# Patient Record
Sex: Female | Born: 1935 | Race: White | Hispanic: No | Smoking: Never smoker
Health system: Southern US, Community
[De-identification: ages and names within clinical notes are randomized; demographics above are authoritative.]

## PROBLEM LIST (undated history)

## (undated) DIAGNOSIS — E039 Hypothyroidism, unspecified: Secondary | ICD-10-CM

## (undated) DIAGNOSIS — N882 Stricture and stenosis of cervix uteri: Secondary | ICD-10-CM

## (undated) DIAGNOSIS — J8410 Pulmonary fibrosis, unspecified: Secondary | ICD-10-CM

## (undated) DIAGNOSIS — K222 Esophageal obstruction: Secondary | ICD-10-CM

## (undated) DIAGNOSIS — K219 Gastro-esophageal reflux disease without esophagitis: Secondary | ICD-10-CM

## (undated) DIAGNOSIS — M8580 Other specified disorders of bone density and structure, unspecified site: Secondary | ICD-10-CM

## (undated) DIAGNOSIS — I10 Essential (primary) hypertension: Secondary | ICD-10-CM

## (undated) DIAGNOSIS — M1990 Unspecified osteoarthritis, unspecified site: Secondary | ICD-10-CM

## (undated) DIAGNOSIS — E785 Hyperlipidemia, unspecified: Secondary | ICD-10-CM

## (undated) HISTORY — DX: Esophageal obstruction: K22.2

## (undated) HISTORY — PX: TOTAL HIP ARTHROPLASTY: SHX124

## (undated) HISTORY — DX: Gastro-esophageal reflux disease without esophagitis: K21.9

## (undated) HISTORY — DX: Hyperlipidemia, unspecified: E78.5

## (undated) HISTORY — PX: OTHER SURGICAL HISTORY: SHX169

## (undated) HISTORY — DX: Stricture and stenosis of cervix uteri: N88.2

## (undated) HISTORY — DX: Essential (primary) hypertension: I10

## (undated) HISTORY — DX: Other specified disorders of bone density and structure, unspecified site: M85.80

## (undated) HISTORY — DX: Pulmonary fibrosis, unspecified: J84.10

## (undated) HISTORY — PX: REVISION TOTAL KNEE ARTHROPLASTY: SHX767

## (undated) HISTORY — PX: NASAL SINUS SURGERY: SHX719

## (undated) HISTORY — DX: Hypothyroidism, unspecified: E03.9

---

## 1976-05-01 HISTORY — PX: VESICOVAGINAL FISTULA CLOSURE W/ TAH: SUR271

## 1976-05-01 HISTORY — PX: ABDOMINAL HYSTERECTOMY: SHX81

## 1997-11-18 ENCOUNTER — Other Ambulatory Visit: Admission: RE | Admit: 1997-11-18 | Discharge: 1997-11-18 | Payer: Self-pay | Admitting: Endocrinology

## 2002-12-25 ENCOUNTER — Encounter: Payer: Self-pay | Admitting: Internal Medicine

## 2002-12-25 ENCOUNTER — Ambulatory Visit (HOSPITAL_COMMUNITY): Admission: RE | Admit: 2002-12-25 | Discharge: 2002-12-25 | Payer: Self-pay | Admitting: Internal Medicine

## 2003-12-15 ENCOUNTER — Encounter: Admission: RE | Admit: 2003-12-15 | Discharge: 2003-12-15 | Payer: Self-pay | Admitting: Endocrinology

## 2004-05-18 ENCOUNTER — Ambulatory Visit (HOSPITAL_COMMUNITY): Admission: RE | Admit: 2004-05-18 | Discharge: 2004-05-18 | Payer: Self-pay | Admitting: Gastroenterology

## 2004-06-27 ENCOUNTER — Encounter: Admission: RE | Admit: 2004-06-27 | Discharge: 2004-06-27 | Payer: Self-pay | Admitting: Endocrinology

## 2004-07-12 ENCOUNTER — Ambulatory Visit (HOSPITAL_BASED_OUTPATIENT_CLINIC_OR_DEPARTMENT_OTHER): Admission: RE | Admit: 2004-07-12 | Discharge: 2004-07-12 | Payer: Self-pay | Admitting: Orthopedic Surgery

## 2004-07-12 ENCOUNTER — Ambulatory Visit (HOSPITAL_COMMUNITY): Admission: RE | Admit: 2004-07-12 | Discharge: 2004-07-12 | Payer: Self-pay | Admitting: Orthopedic Surgery

## 2005-06-13 ENCOUNTER — Encounter: Admission: RE | Admit: 2005-06-13 | Discharge: 2005-06-13 | Payer: Self-pay | Admitting: Endocrinology

## 2005-09-22 ENCOUNTER — Ambulatory Visit (HOSPITAL_COMMUNITY): Admission: RE | Admit: 2005-09-22 | Discharge: 2005-09-22 | Payer: Self-pay | Admitting: Orthopedic Surgery

## 2006-07-13 ENCOUNTER — Encounter: Admission: RE | Admit: 2006-07-13 | Discharge: 2006-07-13 | Payer: Self-pay | Admitting: Cardiovascular Disease

## 2007-04-12 ENCOUNTER — Inpatient Hospital Stay (HOSPITAL_COMMUNITY): Admission: RE | Admit: 2007-04-12 | Discharge: 2007-04-15 | Payer: Self-pay | Admitting: Orthopedic Surgery

## 2007-09-16 ENCOUNTER — Encounter: Payer: Self-pay | Admitting: Orthopedic Surgery

## 2007-09-30 ENCOUNTER — Encounter: Payer: Self-pay | Admitting: Orthopedic Surgery

## 2007-10-30 ENCOUNTER — Encounter: Payer: Self-pay | Admitting: Orthopedic Surgery

## 2008-01-10 ENCOUNTER — Ambulatory Visit (HOSPITAL_COMMUNITY): Admission: EM | Admit: 2008-01-10 | Discharge: 2008-01-10 | Payer: Self-pay | Admitting: Emergency Medicine

## 2008-07-07 ENCOUNTER — Encounter: Admission: RE | Admit: 2008-07-07 | Discharge: 2008-07-07 | Payer: Self-pay | Admitting: Orthopedic Surgery

## 2008-09-08 ENCOUNTER — Emergency Department (HOSPITAL_COMMUNITY): Admission: EM | Admit: 2008-09-08 | Discharge: 2008-09-09 | Payer: Self-pay | Admitting: Emergency Medicine

## 2008-10-08 ENCOUNTER — Encounter: Admission: RE | Admit: 2008-10-08 | Discharge: 2008-10-08 | Payer: Self-pay | Admitting: Endocrinology

## 2008-11-18 ENCOUNTER — Encounter: Payer: Self-pay | Admitting: Orthopedic Surgery

## 2008-11-26 IMAGING — CR DG PORTABLE PELVIS
1 series · 1 of 1 positions shown · non-contrast
Comparison: none

CLINICAL DATA: Right hip replacement

PELVIS - 1  VIEW:

[view not recorded]
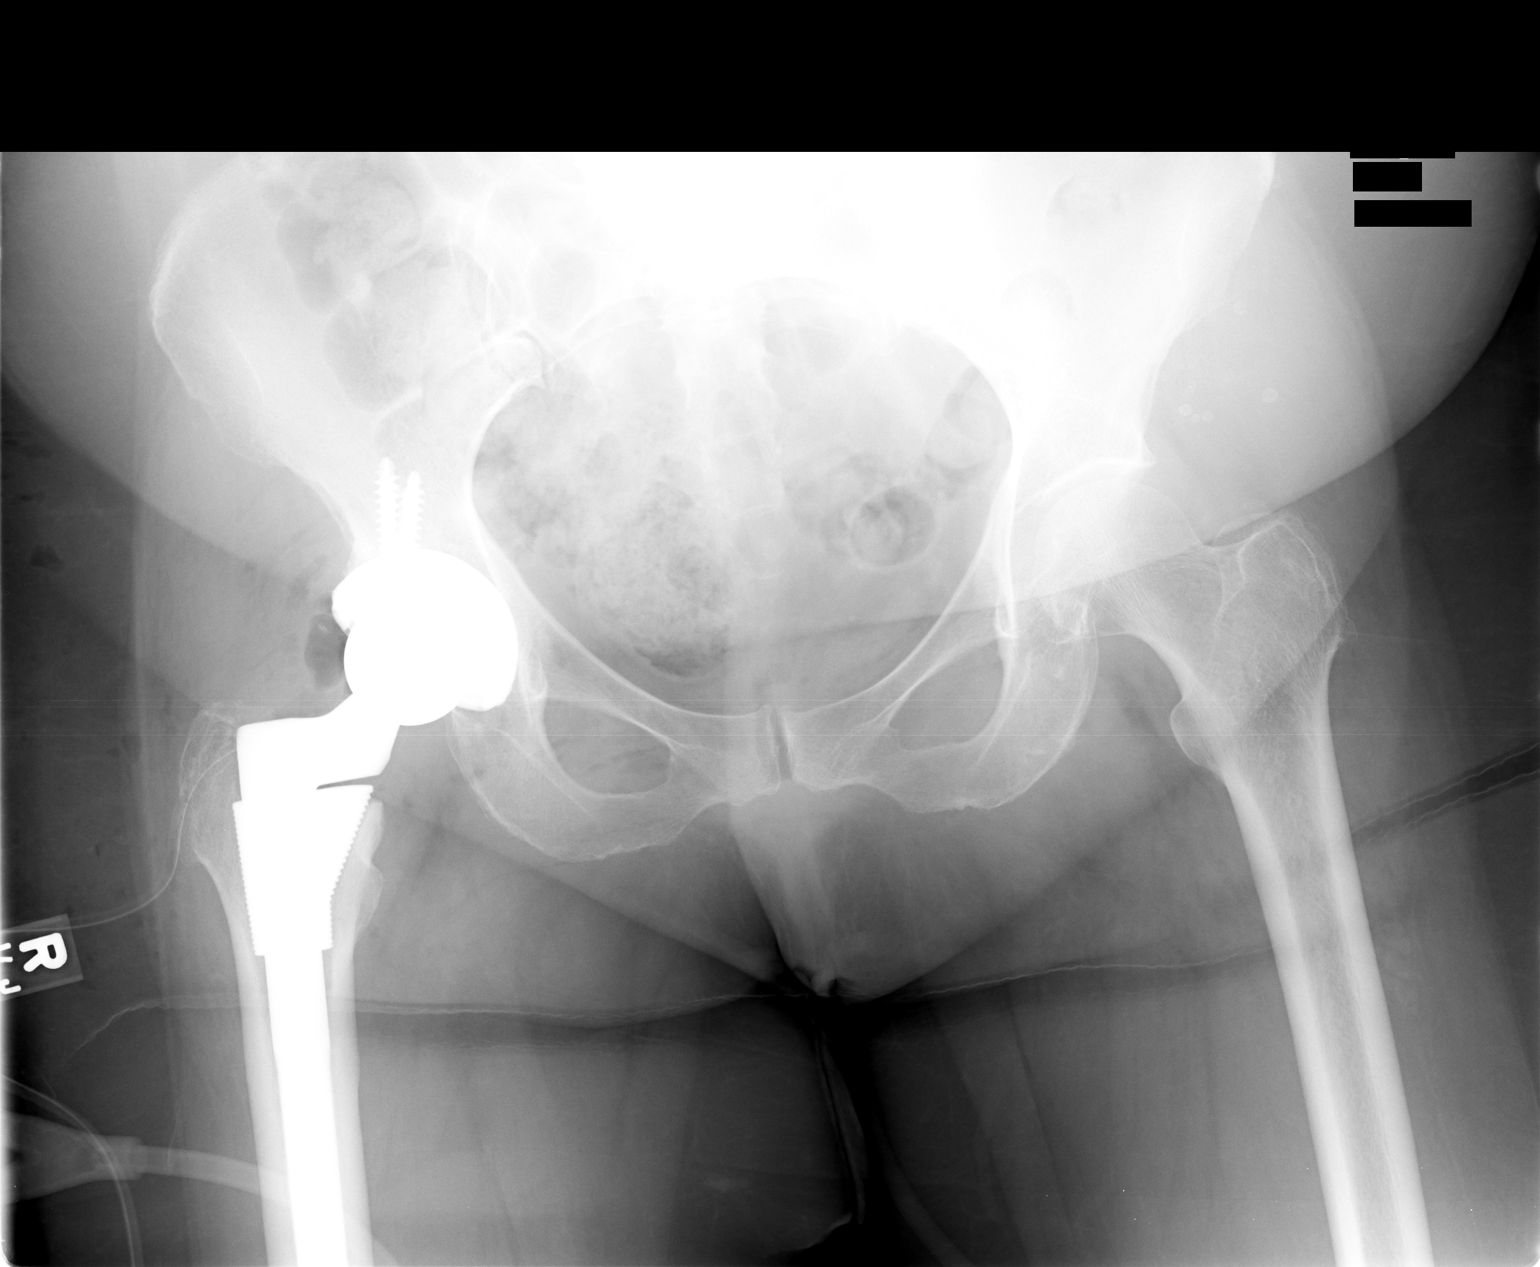

[1 of 1 positions shown; findings below may reference images not displayed]

FINDINGS: Patient status post right hip replacement. No normal AP alignment. No
complicating features.
IMPRESSION: Right hip replacement without complicating feature.

## 2008-11-29 ENCOUNTER — Encounter: Payer: Self-pay | Admitting: Orthopedic Surgery

## 2009-04-07 ENCOUNTER — Encounter: Admission: RE | Admit: 2009-04-07 | Discharge: 2009-04-07 | Payer: Self-pay | Admitting: Endocrinology

## 2009-04-14 ENCOUNTER — Ambulatory Visit: Payer: Self-pay | Admitting: Internal Medicine

## 2009-04-14 DIAGNOSIS — I10 Essential (primary) hypertension: Secondary | ICD-10-CM | POA: Insufficient documentation

## 2009-04-14 DIAGNOSIS — E785 Hyperlipidemia, unspecified: Secondary | ICD-10-CM | POA: Insufficient documentation

## 2009-04-14 DIAGNOSIS — J8410 Pulmonary fibrosis, unspecified: Secondary | ICD-10-CM

## 2009-04-15 ENCOUNTER — Telehealth: Payer: Self-pay | Admitting: Internal Medicine

## 2009-04-15 LAB — CONVERTED CEMR LAB
Basophils Absolute: 0 10*3/uL (ref 0.0–0.1)
Basophils Relative: 0.7 % (ref 0.0–3.0)
Eosinophils Absolute: 0.2 10*3/uL (ref 0.0–0.7)
HCT: 39.7 % (ref 36.0–46.0)
Hemoglobin: 13.4 g/dL (ref 12.0–15.0)
Lymphocytes Relative: 29.7 % (ref 12.0–46.0)
Lymphs Abs: 1.9 10*3/uL (ref 0.7–4.0)
MCHC: 33.8 g/dL (ref 30.0–36.0)
Neutro Abs: 3.9 10*3/uL (ref 1.4–7.7)
RBC: 4.37 M/uL (ref 3.87–5.11)
RDW: 14.1 % (ref 11.5–14.6)

## 2009-04-22 ENCOUNTER — Telehealth: Payer: Self-pay | Admitting: Internal Medicine

## 2009-05-03 ENCOUNTER — Telehealth: Payer: Self-pay | Admitting: Internal Medicine

## 2009-05-05 ENCOUNTER — Ambulatory Visit: Payer: Self-pay | Admitting: Internal Medicine

## 2009-05-05 DIAGNOSIS — N3000 Acute cystitis without hematuria: Secondary | ICD-10-CM | POA: Insufficient documentation

## 2009-05-05 LAB — CONVERTED CEMR LAB
BUN: 14 mg/dL (ref 6–23)
Basophils Absolute: 0 10*3/uL (ref 0.0–0.1)
Bilirubin Urine: NEGATIVE
CO2: 30 meq/L (ref 19–32)
Eosinophils Absolute: 0.2 10*3/uL (ref 0.0–0.7)
GFR calc non Af Amer: 73.79 mL/min (ref >60)
Glucose, Bld: 96 mg/dL (ref 70–99)
HCT: 41.8 % (ref 36.0–46.0)
Hemoglobin: 13.6 g/dL (ref 12.0–15.0)
Ketones, ur: NEGATIVE mg/dL
Lymphs Abs: 1.6 10*3/uL (ref 0.7–4.0)
MCHC: 32.4 g/dL (ref 30.0–36.0)
Monocytes Absolute: 0.6 10*3/uL (ref 0.1–1.0)
Neutro Abs: 5.2 10*3/uL (ref 1.4–7.7)
Platelets: 273 10*3/uL (ref 150.0–400.0)
Potassium: 4.5 meq/L (ref 3.5–5.1)
RDW: 13.3 % (ref 11.5–14.6)
Sed Rate: 67 mm/hr — ABNORMAL HIGH (ref 0–22)
Sodium: 139 meq/L (ref 135–145)
Urine Glucose: NEGATIVE mg/dL
Urobilinogen, UA: 0.2 (ref 0.0–1.0)

## 2009-05-07 ENCOUNTER — Telehealth (INDEPENDENT_AMBULATORY_CARE_PROVIDER_SITE_OTHER): Payer: Self-pay | Admitting: *Deleted

## 2009-05-14 ENCOUNTER — Ambulatory Visit: Payer: Self-pay | Admitting: Internal Medicine

## 2009-05-14 DIAGNOSIS — K219 Gastro-esophageal reflux disease without esophagitis: Secondary | ICD-10-CM

## 2009-05-31 ENCOUNTER — Ambulatory Visit: Payer: Self-pay | Admitting: Internal Medicine

## 2009-05-31 DIAGNOSIS — J310 Chronic rhinitis: Secondary | ICD-10-CM

## 2009-06-25 ENCOUNTER — Ambulatory Visit: Payer: Self-pay | Admitting: Internal Medicine

## 2009-07-14 ENCOUNTER — Telehealth (INDEPENDENT_AMBULATORY_CARE_PROVIDER_SITE_OTHER): Payer: Self-pay | Admitting: *Deleted

## 2009-07-28 ENCOUNTER — Ambulatory Visit: Payer: Self-pay | Admitting: Internal Medicine

## 2009-09-09 ENCOUNTER — Ambulatory Visit: Payer: Self-pay | Admitting: Internal Medicine

## 2009-09-10 LAB — CONVERTED CEMR LAB
Basophils Absolute: 0 10*3/uL (ref 0.0–0.1)
Hemoglobin: 13.8 g/dL (ref 12.0–15.0)
Lymphocytes Relative: 15 % (ref 12.0–46.0)
Monocytes Relative: 5.8 % (ref 3.0–12.0)
Neutro Abs: 7.1 10*3/uL (ref 1.4–7.7)
Neutrophils Relative %: 78.3 % — ABNORMAL HIGH (ref 43.0–77.0)
Platelets: 263 10*3/uL (ref 150.0–400.0)
RDW: 14.9 % — ABNORMAL HIGH (ref 11.5–14.6)
Sed Rate: 32 mm/hr — ABNORMAL HIGH (ref 0–22)

## 2009-09-14 LAB — CONVERTED CEMR LAB: IgE (Immunoglobulin E), Serum: 2.5 intl units/mL (ref 0.0–180.0)

## 2009-10-25 ENCOUNTER — Ambulatory Visit: Payer: Self-pay | Admitting: Internal Medicine

## 2009-12-02 ENCOUNTER — Encounter: Payer: Self-pay | Admitting: Internal Medicine

## 2010-01-31 ENCOUNTER — Telehealth (INDEPENDENT_AMBULATORY_CARE_PROVIDER_SITE_OTHER): Payer: Self-pay | Admitting: *Deleted

## 2010-04-21 ENCOUNTER — Encounter: Payer: Self-pay | Admitting: Internal Medicine

## 2010-05-22 ENCOUNTER — Encounter: Payer: Self-pay | Admitting: Orthopedic Surgery

## 2010-05-31 NOTE — Assessment & Plan Note (Signed)
Summary: Pulmonary/ ext f/u ov rx uti with amox   Copy to:  Dr. Adrian Prince Primary Provider/Referring Provider:  Dr. Adrian Prince  CC:  3 wk followup.  Pt states that her chest does not feel as tight.  She states that her cough has been "horrible"- prod with clear sputum.  She states that her breathing is some better.  She c/o having "no energy at all"..  History of Present Illness: 55 yowf never smoker with h/o rhinitis since 1957 seasonal shots helped  afrin dep and has carried dx of asthma for approx one year rx with singulair which may not have helped the breathing but may have helped her nasal symptoms.  April 14, 2009 cc chest discomfort developed overnight early October(occured one week exposure  to sick people traveling from [place] on a bus/air trip)   worse with breathing assoc with dry cough increase sob got worse over a week and went to Dr Evlyn Kanner dx as pneumonia rx with 2 rounds of abx seemed some better but still has discomfort with deep breath and worse sob with activity.  sob or comfort  worse with deep breath, and lyingdown occurs but it will extinguish as long as lies still, then worse after wakes up to go to BR or with talking.  Changed from nitrofurantoin to trimetoprim thinks about 1 week ago she thinks (last bottle of 30 filled on 01/16/09 per pharmacy records).  Rec was to try taking ppi two times a day and pepcid at bedtime plus delsym and tramadol could not tolerate delsym dizzy.  May 05, 2009 cough no better then develop nasal congestion with yellow mucus rx mucinex and feels better.  Pt denies any significant sore throat, dysphagia, itching, sneezing,  nasal congestion or excess secretions,  fever, chills, sweats, unintended wt loss, pleuritic or exertional cp, hempoptysis, change in activity tolerance  orthopnea pnd or leg swelling Pt also denies any obvious fluctuation in symptoms with weather or environmental change or other alleviating or aggravating factors.        Current Medications (verified): 1)  Singulair 10 Mg Tabs (Montelukast Sodium) .Marland Kitchen.. 1 Once Daily 2)  Flonase 50 Mcg/act Susp (Fluticasone Propionate) .... 2 Sprays Each Nostril Once Daily 3)  Synthroid 100 Mcg Tabs (Levothyroxine Sodium) .Marland Kitchen.. 1 Once Daily 4)  Mobic 15 Mg Tabs (Meloxicam) .Marland Kitchen.. 1 Once Daily 5)  Tylenol 325 Mg Tabs (Acetaminophen) .... As Needed 6)  Aspirin 81 Mg Tbec (Aspirin) .Marland Kitchen.. 1 Once Daily 7)  Benicar Hct 40-25 Mg Tabs (Olmesartan Medoxomil-Hctz) .... 1/2 Once Daily 8)  Crestor 10 Mg Tabs (Rosuvastatin Calcium) .Marland Kitchen.. 1 Once Daily 9)  Multivitamins  Tabs (Multiple Vitamin) .Marland Kitchen.. 1 Once Daily 10)  Prilosec Otc 20 Mg Tbec (Omeprazole Magnesium) .... Take One 30-60 Min Before First and Last Meals of The Day 11)  Pepcid Ac Maximum Strength 20 Mg Tabs (Famotidine) .... One At Bedtime 12)  Tramadol Hcl 50 Mg  Tabs (Tramadol Hcl) .... One To Two By Mouth Every 4-6 Hours For Cough or Cp 13)  Promethazine-Codeine 6.25-10 Mg/37ml Syrp (Promethazine-Codeine) .Marland Kitchen.. 1 Teaspoon Every 6 Hours As Needed 14)  Mucinex Dm Maximum Strength 60-1200 Mg Xr12h-Tab (Dextromethorphan-Guaifenesin) .Marland Kitchen.. 1 To 2 Two Times A Day As Needed  Allergies (verified): No Known Drug Allergies  Past History:  Past Medical History: Hyperlipidemia Hypertension GERD    - s/p stretching 2010 Dr [address]ore[address] Pulmonary Fibrosis    - Hx of macrodantin exposure stopped ? 01/2009    - ESR  49   04/14/2009   > 67 May 05, 2009   Family History: Reviewed history from 04/14/2009 and no changes required. Heart dz- Father Negative for respiratory diseases or atopy   Social History: Reviewed history from 04/14/2009 and no changes required. Married Children Never smoker No ETOH Retired   Vital Signs:  Patient profile:   77 year old female Weight:      166 pounds O2 Sat:      95 % on Room air Temp:     97.7 degrees F oral Pulse rate:   66 / minute BP sitting:   130 / 82  (left arm)  Vitals Entered By: Vernie Murders (May 05, 2009 10:12 AM)  O2 Flow:  Room air  Physical Exam  Additional Exam:  amb argumentatinve wf nad  wt 171 April 14, 2009 > 166 May 05, 2009  HEENT: nl dentition, turbinates, and orophanx. Nl external ear canals without cough reflex Neck without JVD/Nodes/TM Lungs bilateral insp crackles R > L with cough on insp RRR no s3 or murmur or increase in P2 Abd soft and benign with nl excursion in the supine position. No bruits or organomegaly Ext warm without calf tenderness, cyanosis clubbing or edema Skin warm and dry without lesions   Sodium                    139 mEq/L                   135-145   Potassium                 4.5 mEq/L                   3.5-5.1   Chloride                  102 mEq/L                   96-112   Carbon Dioxide            30 mEq/L                    19-32   Glucose                   96 mg/dL                    04-54   BUN                       14 mg/dL                    0-98   Creatinine                0.8 mg/dL                   1.1-9.1   Calcium                   9.6 mg/dL                   4.7-82.9   GFR                       73.79 mL/min                >60  Tests: (2) CBC Platelet  w/Diff (CBCD)   White Cell Count          7.6 K/uL                    4.5-10.5   Red Cell Count            4.59 Mil/uL                 3.87-5.11   Hemoglobin                13.6 g/dL                   25.3-66.4   Hematocrit                41.8 %                      36.0-46.0   MCV                       91.2 fl                     78.0-100.0   MCHC                      32.4 g/dL                   40.3-47.4   RDW                       13.3 %                      11.5-14.6   Platelet Count            273.0 K/uL                  150.0-400.0   Neutrophil %              67.5 %                      43.0-77.0   Lymphocyte %              21.3 %                      12.0-46.0   Monocyte %                8.4 %                       3.0-12.0   Eosinophils%               2.3 %                       0.0-5.0   Basophils %               0.5 %                       0.0-3.0   Neutrophill Absolute      5.2 K/uL                    1.4-7.7   Lymphocyte Absolute       1.6 K/uL  0.7-4.0   Monocyte Absolute         0.6 K/uL                    0.1-1.0  Eosinophils, Absolute                             0.2 K/uL                    0.0-0.7   Basophils Absolute        0.0 K/uL                    0.0-0.1  Tests: (3) Sed Rate (ESR)   Sed Rate             [H]  67 mm/hr                    0-22  Tests: (4) UDip w/Micro (URINE)   Color                     YELLOW       RANGE:  Yellow;Lt. Yellow   Clarity                   CLEAR                       Clear   Specific Gravity          1.020                       1.000 - 1.030   Urine Ph                  5.5                         5.0-8.0   Protein                   30                          Negative   Urine Glucose             NEGATIVE                    Negative   Ketones                   NEGATIVE                    Negative   Urine Bilirubin           NEGATIVE                    Negative   Blood                     SMALL                       Negative   Urobilinogen              0.2                         0.0 - 1.0   Leukocyte Esterace  LARGE                       Negative   Nitrite                   POSITIVE                    Negative   Urine WBC                 TNTC(>50/hpf)               0-2/hpf   Urine Epith               Rare(0-4/hpf)               Rare(0-4/hpf)   Urine Bacteria            Many(>50/hpf)               None  CXR  Procedure date:  05/05/2009  Findings:      Comparison: 04/14/2009   Findings: Findings are compatible with pulmonary interstitial fibrosis.  Focal parenchymal density lateral aspect of right midlung zone is stable. Normal cardiomediastinal silhouette.  Bony thorax intact.   IMPRESSION: Pulmonary interstitial fibrosis.  Stable focal parenchymal  density lateral aspect of the right midlung zone.  No acute infiltrate.    Impression & Recommendations:  Problem # 1:  PULMONARY FIBROSIS ILD POST INFLAMMATORY CHRONIC (ICD-515)  DDx for pulmonary fibrosis  in never smokers  includes idiopathic pulmonary fibrosis, pulmonary fibrosis associated with rheumatologic disease, adverse effect from  drugs such as chemotherapy or amiodarone exposure/ macrodantin use , nonspecific interstitial pneumonia which is typically steroid responsive, and chronic hypersensitivity pneumonitis.  In this case it's pretty clear the macrodantin was playing a role with mod elevation of esr and neg clubbing supportive but certainly not dx  For now follow conservatively off macrodantin, off all oil based vitamins since she has documented GERD and is only protected from acid, not non-acid gerd, by PPI rx reviewed.  See instructions for specific recommendations   extended discussion with pt re: patience with changes in rx   Offered second opinion at [facility], she declined.   No role for bx at this point.   Since ESR trending up off macrodantin, try very short course of low dose prednisone just for purposes of beginning to develop a dose response curve here acknowledging that the goal with a chronic steroid responsive illness is always arriving at the lowest effective dose that controls the disease/symptoms and not accepting a set "formula" which is based on statistics that don't take into accound individual variability or the natural hx of the dz in every individual patient, which may well vary over time.   Problem # 2:  CYSTITIS, ACUTE (ICD-595.0) Assessment: New  Pos pyuria, with E coli > 100k  sensitive to pcn.  rx amox 250 two times a day x 7 days Orders: T-Urine Culture (Spectrum Order) [id number]) TLB-Udip w/ Micro (81001-URINE) Est. Patient Level IV (13086)  Her updated medication list for this problem includes:    Amoxicillin 250 Mg Caps (Amoxicillin) .....  One twice daily x 7 days  Medications Added to Medication List This Visit: 1)  Mucinex Dm Maximum Strength 60-1200 Mg Xr12h-tab (Dextromethorphan-guaifenesin) .Marland Kitchen.. 1 to 2 two times a day as needed 2)  Prednisone 10 Mg Tabs (Prednisone) .... 4 each am x 2days, 2x2days, 1x2days and stop 3)  Amoxicillin 250 Mg  Caps (Amoxicillin) .... One twice daily x 7 days  Other Orders: T-2 View CXR (71020TC) TLB-BMP (Basic Metabolic Panel-BMET) (80048-METABOL) TLB-CBC Platelet - w/Differential (85025-CBCD) TLB-Sedimentation Rate (ESR) (85652-ESR)  Patient Instructions: 1)  Your last prescription for macrodantin was filled by you on 01/16/09 2)  you should avoid this drug because it can cause pulmonary fibrosis and fish oil can get in the lungs and cause a similar problem as can acid from the stomach 3)  we will call you with the lab results 4)  Please schedule a follow-up appointment in 2 weeks. 5)  ok to use mucinex and tramadol for cough 6)  Prednisone 4 each am x 2days, 2x2days, 1x2days and stop  Prescriptions: AMOXICILLIN 250 MG CAPS (AMOXICILLIN) one twice daily x 7 days  #14 x 0   Entered and Authorized by:   Nyoka Cowden MD   Signed by:   Nyoka Cowden MD on 05/07/2009   Method used:   Electronically to        [facility]  [address] [phone]* (retail)       [address]       [place], [place]  [address]       Ph: [phone]       Fax: [phone]   RxID:   [id number] PREDNISONE 10 MG  TABS (PREDNISONE) 4 each am x 2days, 2x2days, 1x2days and stop  #14 x 0   Entered and Authorized by:   Nyoka Cowden MD   Signed by:   Nyoka Cowden MD on 05/05/2009   Method used:   Electronically to        [facility]  [address] [phone]* (retail)       [address]       [place], [place]  [address]       Ph: [phone]       Fax: [phone]   RxID:   [id number]   Appended Document: Pulmonary/ ext f/u ov rx uti with amox let her know I called in  amox for her uti based on cultures (not allergic to pcn per records) copy to Dr Evlyn Kanner  Appended Document: Pulmonary/ ext f/u ov rx uti with amox Copy sent to [facility].  LMOMTCB.  Appended Document: Pulmonary/ ext f/u ov rx uti with amox Pt aware ABX is at the pharmacy for her.  Verified that she is not allergic to any meds.

## 2010-05-31 NOTE — Assessment & Plan Note (Signed)
Summary: Pulmonary/ final f/u ov  walked 3 laps fast with no desat   Copy to:  Dr. Adrian Prince Primary Provider/Referring Provider:  Dr. Adrian Prince  CC:  6 wk followup.  Pt states that her breathing is the same- "maybe some better".  She states that she still gets out of breath walking up and down steps and sometimes just walking from room to room.  Marland Kitchen  History of Present Illness: 73  yowf never smoker with h/o rhinitis since 1957 seasonal shots helped  afrin dep and has carried dx of asthma for   rx with singulair which may not have helped the breathing but may have helped her nasal symptoms.  April 14, 2009 cc chest discomfort developed overnight early October(occured one week exposure  to sick people traveling from [place] on a bus/air trip)   worse with breathing assoc with dry cough increase sob got worse over a week and went to Dr Evlyn Kanner dx as pneumonia rx with 2 rounds of abx seemed some better but still has discomfort with deep breath and worse sob with activity.   dx ? ILD ? from [facility]  with upper airway coughing rx as uacs with gerd rx but not adherent.  May 14, 2009 Followup.  Pt states that she woke up very SOB and gasping for air 2 nights ago.  She states that she has flet very "sluggish"- relates this to cough meds.  She still c/o cough - prod with clear sputum.  rec start prednisone 20 until better taper to 10.  May 31, 2009 some better on 20 of prednisone  tapered off (instructions said to hold at one). worse w/in a week off.  July 28, 2009 6 wk followup.  Pt states that her cough is about the same- now she is coughing up clear sputum- realtes this change to the rainy weather and allergies.  She states that her breathing had been okay but worse now since rainy weather. did worse off singulair in terms of "allergies and sinus" so restarted.  rec taper prednisone to 10 mg one half daily when better  October 25, 2009 tapered completely off prednisone June 2 no flare of  cough or sob sio far still off oil based supplments.  Pt denies any significant sore throat, dysphagia, itching, sneezing,  nasal congestion or excess secretions,  fever, chills, sweats, unintended wt loss, pleuritic or exertional cp, hempoptysis, change in activity tolerance  orthopnea pnd or leg swelling Pt also denies any obvious fluctuation in symptoms with weather or environmental change or other alleviating or aggravating factors.          Current Medications (verified): 1)  Flonase 50 Mcg/act Susp (Fluticasone Propionate) .... 2 Sprays Each Nostril Once Daily 2)  Synthroid 100 Mcg Tabs (Levothyroxine Sodium) .Marland Kitchen.. 1 Once Daily 3)  Mobic 15 Mg Tabs (Meloxicam) .Marland Kitchen.. 1 Once Daily 4)  Tylenol Extra Strength 500 Mg Tabs (Acetaminophen) .... As Directed As Needed 5)  Aspirin 81 Mg Tbec (Aspirin) .Marland Kitchen.. 1 Once Daily 6)  Crestor 10 Mg Tabs (Rosuvastatin Calcium) .Marland Kitchen.. 1 Once Daily 7)  Multivitamins  Tabs (Multiple Vitamin) .Marland Kitchen.. 1 Once Daily 8)  Prilosec Otc 20 Mg Tbec (Omeprazole Magnesium) .... Take  One 30-60 Min Before First Meal of The Day 9)  Tramadol Hcl 50 Mg  Tabs (Tramadol Hcl) .... One To Two By Mouth Every 4-6 Hours For Cough or Cp 10)  Promethazine-Codeine 6.25-10 Mg/68ml Syrp (Promethazine-Codeine) .Marland Kitchen.. 1 Teaspoon Every 6 Hours As Needed 11)  Trimethoprim 100 Mg Tabs (Trimethoprim) .... As Needed 12)  Benicar Hct 40-25 Mg  Tabs (Olmesartan Medoxomil-Hctz) .... One Half Daily 13)  Glucosomine Chondrotin Msg Maxium  1500-1200-500 .... 2 Daily 14)  Alprazolam 0.25 Mg Tabs (Alprazolam) .Marland Kitchen.. 1 At Bedtime As Needed  Allergies (verified): No Known Drug Allergies  Past History:  Past Medical History: Hyperlipidemia Hypertension GERD    - s/p stretching 2010 Dr Loreta Ave Pulmonary Fibrosis........................................................Marland KitchenWert    - Hx of macrodantin exposure stopped ? 01/2009    - ESR 49   04/14/2009   >  67 May 05, 2009 > 32 Sep 09, 2009     - Daily prednisone  rx May 15, 2009 > some better then worse after stopped 06/18/09    - Restart daily prednisone June 26, 2009 >> tapered off 09/30/09 > no relapse of symptoms .  Vital Signs:  Patient profile:   77 year old female Weight:      173 pounds O2 Sat:      94 % on Room air Temp:     97.6 degrees F oral Pulse rate:   76 / minute BP sitting:   116 / 74  (left arm)  Vitals Entered By: Vernie Murders (October 25, 2009 10:36 AM)  O2 Flow:  Room air CC: 6 wk followup.  Pt states that her breathing is the same- "maybe some better".  She states that she still gets out of breath walking up and down steps and sometimes just walking from room to room.   Comments Ambulatory Pulse Oximetry  Resting; HR_73____    02 Sat_95%RA____  Lap1 (185 feet)   HR_92____   02 Sat_94%RA____ Lap2 (185 feet)   HR__103___   02 Sat__93%RA___    Lap3 (185 feet)   HR__105___   02 Sat__93%RA___  __X_Test Completed without Difficulty ___Test Stopped due to:     Physical Exam  Additional Exam:  amb wf nad  .   wt 171 April 14, 2009 >   > 175 July 29, 2009 > 175 Sep 09, 2009 > 173  October 25, 2009 HEENT: nl dentition, turbinates, and orophanx. Nl external ear canals without cough reflex Neck without JVD/Nodes/TM Lungs minimal  bilateral insp crackles R > L with no cough now on insp RRR no s3 or murmur or increase in P2 Abd soft and benign with nl excursion in the supine position. No bruits or organomegaly Ext warm without calf tenderness, cyanosis clubbing or edema Skin warm and dry without lesions     Impression & Recommendations:  Problem # 1:  PULMONARY FIBROSIS ILD POST INFLAMMATORY CHRONIC (ICD-515) Appears to have had a steroid responsive but not dependent form of PF no longer desaturating ? macrodantin assoc or assoc with collagen vasc dz.   The goal with a chronic steroid dependent illness is always arriving at the lowest effective dose that controls the disease/symptoms and not accepting a set  "formula" which is based on statistics that don't take into accound individual variability or the natural hx of the dz in every individual patient, which may well vary over time. For now try to hold prednisone at 0.   Problem # 2:  GERD (ICD-530.81)  Her updated medication list for this problem includes:    Prilosec Otc 20 Mg Tbec (Omeprazole magnesium) .Marland Kitchen... Take  one 30-60 min before first meal of the day  Previously with pos egd so diet and ppi impt here since acid reflux risks flaring any form of ILD and  therefore strongly rec she avoid oil based supplements as well.  Medications Added to Medication List This Visit: 1)  Tylenol Extra Strength 500 Mg Tabs (Acetaminophen) .... As directed as needed 2)  Alprazolam 0.25 Mg Tabs (Alprazolam) .Marland Kitchen.. 1 at bedtime as needed  Other Orders: Est. Patient Level III (45409) Pulse Oximetry, Ambulatory (81191)  Patient Instructions: 1)  If consistently doing well with cough and beathing it is ok with me for you to resume fish oil if you think it helps your arthritis with the option of returning to Dr Loni Beckwith for follow up and let her treat your arthritis. 2)  Follow up here can be as needed

## 2010-05-31 NOTE — Assessment & Plan Note (Signed)
Summary: Pulmonary/ ext f/u ov   Copy to:  Dr. Adrian Prince Primary Provider/Referring Provider:  Dr. Adrian Prince  CC:  6 wk followup with cxr.  She c/o increased SOB x 2 days.  She states that she was doing well with prednisone and then when she d/c'ed med she started to notice increase in symptoms.  She c/o dry cough- occ produces thick clear sputum.  She also c/o chest "feels full"..  History of Present Illness: 30 yowf never smoker with h/o rhinitis since 1957 seasonal shots helped  afrin dep and has carried dx of asthma for approx one year rx with singulair which may not have helped the breathing but may have helped her nasal symptoms.  April 14, 2009 cc chest discomfort developed overnight early October(occured one week exposure  to sick people traveling from [place] on a bus/air trip)   worse with breathing assoc with dry cough increase sob got worse over a week and went to Dr Evlyn Kanner dx as pneumonia rx with 2 rounds of abx seemed some better but still has discomfort with deep breath and worse sob with activity.  sob or comfort  worse with deep breath, and lying down occurs but it will extinguish as long as lies still, then worse after wakes up to go to BR or with talking.  Changed from nitrofurantoin to trimetoprim thinks about 1 week prior to ov  (last bottle of 30 filled on 01/16/09 per pharmacy records).  Rec was to try taking ppi two times a day and pepcid at bedtime plus delsym and tramadol could not tolerate delsym dizzy   May 05, 2009 cough no better then develop nasal congestion with yellow mucus rx mucinex and feels better rx with amox / prednisone  > much better overall, at least transiently  May 14, 2009 Followup.  Pt states that she woke up very SOB and gasping for air 2 nights ago.  She states that she has flet very "sluggish"- relates this to cough meds.  She still c/o cough - prod with clear sputum.  rec start prednisone 20 until better taper to 10.  May 31, 2009  some better on 20 of prednisone  tapered off (instructions said to hold at one). worse w/in a week off.  June 26, 2009  cc much  increased SOB x 2 days.  She states that she was doing well with prednisone and then when she d/c'ed med she started to notice increase in symptoms.  She c/o dry cough- occ produces thick clear sputum.  She also c/o chest "feels full". Pt denies any significant sore throat, dysphagia, itching, sneezing,  nasal congestion or excess secretions.  Current Medications (verified): 1)  Singulair 10 Mg Tabs (Montelukast Sodium) .Marland Kitchen.. 1 Once Daily 2)  Flonase 50 Mcg/act Susp (Fluticasone Propionate) .... 2 Sprays Each Nostril Once Daily 3)  Synthroid 100 Mcg Tabs (Levothyroxine Sodium) .Marland Kitchen.. 1 Once Daily 4)  Mobic 15 Mg Tabs (Meloxicam) .Marland Kitchen.. 1 Once Daily 5)  Tylenol 325 Mg Tabs (Acetaminophen) .... As Needed 6)  Aspirin 81 Mg Tbec (Aspirin) .Marland Kitchen.. 1 Once Daily 7)  Benicar Hct 40-25 Mg Tabs (Olmesartan Medoxomil-Hctz) .... 1/2 Once Daily 8)  Crestor 10 Mg Tabs (Rosuvastatin Calcium) .Marland Kitchen.. 1 Once Daily 9)  Multivitamins  Tabs (Multiple Vitamin) .Marland Kitchen.. 1 Once Daily 10)  Prilosec Otc 20 Mg Tbec (Omeprazole Magnesium) .... Take One 30-60 Min Before First and Last Meals of The Day 11)  Pepcid Ac Maximum Strength 20 Mg Tabs (Famotidine) .Marland KitchenMarland KitchenMarland Kitchen  One At Bedtime 12)  Tramadol Hcl 50 Mg  Tabs (Tramadol Hcl) .... One To Two By Mouth Every 4-6 Hours For Cough or Cp 13)  Promethazine-Codeine 6.25-10 Mg/24ml Syrp (Promethazine-Codeine) .Marland Kitchen.. 1 Teaspoon Every 6 Hours As Needed 14)  Trimethoprim 100 Mg Tabs (Trimethoprim) .... As Needed  Allergies (verified): No Known Drug Allergies  Past History:  Past Medical History: Hyperlipidemia Hypertension GERD    - s/p stretching 2010 Dr Loreta Ave Pulmonary Fibrosis........................................................Marland KitchenWert    - Hx of macrodantin exposure stopped ? 01/2009    - ESR 49   04/14/2009   >  67 May 05, 2009     - Daily prednisone rx  May 15, 2009 > some better then worse after stopped 06/18/09    - Restart daily prednisone June 26, 2009  .  Vital Signs:  Patient profile:   77 year old female Weight:      170.38 pounds O2 Sat:      96 % on Room air Temp:     97.3 degrees F oral Pulse rate:   65 / minute BP sitting:   114 / 72  (left arm)  Vitals Entered By: Arman Filter LPN (June 25, 2009 10:45 AM)  O2 Flow:  Room air  Physical Exam  Additional Exam:  amb wf nad  who  failed to answer a single question asked in a straightforward manner, tending to go off on tangents or answer questions with ambiguous medical terms or diagnoses and seemed upset when asked the same question more than once for clarification.   wt 171 April 14, 2009 > 166 May 05, 2009 > 166 May 14, 2009 > 168 05/31/09 > 170 June 26, 2009  HEENT: nl dentition, turbinates, and orophanx. Nl external ear canals without cough reflex Neck without JVD/Nodes/TM Lungs bilateral insp crackles R > L with cough on insp RRR no s3 or murmur or increase in P2 Abd soft and benign with nl excursion in the supine position. No bruits or organomegaly Ext warm without calf tenderness, cyanosis clubbing or edema Skin warm and dry without lesions     CXR  Procedure date:  06/25/2009  Findings:       1.  Subpleural reticulation likely related to mild interstitial fibrosis.  No convincing evidence of acute superimposed process. 2.  Mild cardiomegaly.  Impression & Recommendations:  Problem # 1:  PULMONARY FIBROSIS ILD POST INFLAMMATORY CHRONIC (ICD-515)  esr previously  down from 67  to 21 on relatively  low doses of  prednisone but somwhat  improved by hx which is now and always has been like scooping sand with a fork with this pt, same with giving her instructions (like serving sugar with fork) .    The goal with a chronic steroid responsive  illness is always arriving at the lowest effective dose that controls the disease/symptoms  and not accepting a set "formula" which is based on statistics that don't take into accound individual variability or the natural hx of the dz in every individual patient, which may well vary over time.  See instructions for specific recommendations to try again 20 mg per day as a ceiling and 10 mg per day as a floor.  Problem # 2:  CHRONIC RHINITIS (ICD-472.0)  She's not convinced singulair really helped her nose but prednisone did, so she's probably not prostascyclin driven rhinitis after all.  Try off singulair.   Orders: Est. Patient Level IV (11914)  Problem # 3:  GERD (ICD-530.81)  Her updated medication list for this problem includes:    Prilosec Otc 20 Mg Tbec (Omeprazole magnesium) .Marland Kitchen... Take  one 30-60 min before first meal of the day    Pepcid Ac Maximum Strength 20 Mg Tabs (Famotidine) ..... One at bedtime  NB the  ramp to expected improvement (and for that matter, worsening, if a chronic effective medication is stopped)  can be measured in weeks, not days, a common misconception because this is not Heartburn with no immediate cause and effect relationship so that response to therapy or lack thereof can be very difficult to assess.   Orders: Est. Patient Level IV (16109)  Medications Added to Medication List This Visit: 1)  Prilosec Otc 20 Mg Tbec (Omeprazole magnesium) .... Take  one 30-60 min before first meal of the day 2)  Trimethoprim 100 Mg Tabs (Trimethoprim) .... As needed 3)  Prednisone 10 Mg Tabs (Prednisone) .... Take as directed on instruction sheet  Other Orders: T-2 View CXR (71020TC)  Patient Instructions: 1)  Stop singulair to see if it makes any difference in any of your symptoms 2)  Afrin should only be for 5 days in any one nostril 3)  I emphasized that nasal steroids (Fluticasone/flonase) have no immediate benefit in terms of improving symptoms.  To help them reached the target tissue, the patient should use Afrin two puffs every 12 hours applied one  min before using the nasal steroids.  Afrin should be stopped after no more than 5 days.  If the symptoms worsen, Afrin can be restarted after 5 days off of therapy to prevent rebound congestion from overuse of Afrin.  I also emphasized that in no way are nasal steroids a concern in terms of "addiction". 4)  GERD (REFLUX)  is a common cause of respiratory symptoms. It commonly presents without heartburn and can be treated with medication, but also with lifestyle changes including avoidance of late meals, excessive alcohol, smoking cessation, and avoid fatty foods, chocolate, peppermint, colas, red wine, and acidic juices such as orange juice. NO MINT OR MENTHOL PRODUCTS SO NO COUGH DROPS  5)  USE SUGARLESS CANDY INSTEAD (jolley ranchers)  6)  NO OIL BASED VITAMINS  7)  Prednisone 10 mg 2 daily until 100% better,  then 1 1/2 x 5 days, then 1 daily and don't stop, but if conditions go back to the previous dose that worked until return here in one month for cxr  8)  Unlike when you get a prescription for eyeglasses, it's not possible to always walk out of this or any medical office with a perfect prescription that is immediately effective  based on any test that we offer here.  On the contrary, it may take several weeks for the full impact of changes recommened today - hopefully you will respond well.  If not, then we'll adjust your medication on your next visit accordingly, knowing more then than we can possibly know now.    Prescriptions: PREDNISONE 10 MG  TABS (PREDNISONE) take as directed on instruction sheet  #100 x 0   Entered and Authorized by:   Nyoka Cowden MD   Signed by:   Nyoka Cowden MD on 06/25/2009   Method used:   Electronically to        [facility]  [address] [phone][facility] (retail)       [address]       [place], [place]  [address]  Ph: [phone]       Fax: [phone]   RxID:   [id number]

## 2010-05-31 NOTE — Assessment & Plan Note (Signed)
Summary: Pulmonary/  PF plus UTI     Copy to:  Dr. Adrian Prince Primary Provider/Referring Provider:  Dr. Adrian Prince  CC:  Acute visit.  Pt c/o painful and frequent urination x 1 wk.  She states that she needs a new med to replace the macrodantin.   Pt also c/o prod cough with clear sputum since this am.  She relates this to allergies..  History of Present Illness: 77 yowf never smoker with h/o rhinitis since 1957 seasonal shots helped  afrin dep and has carried dx of asthma for approx one year rx with singulair which may not have helped the breathing but may have helped her nasal symptoms.  April 14, 2009 cc chest discomfort developed overnight early October(occured one week exposure  to sick people traveling from [place] on a bus/air trip)   worse with breathing assoc with dry cough increase sob got worse over a week and went to Dr Evlyn Kanner dx as pneumonia rx with 2 rounds of abx seemed some better but still has discomfort with deep breath and worse sob with activity.  sob or comfort  worse with deep breath, and lying down occurs but it will extinguish as long as lies still, then worse after wakes up to go to BR or with talking.  Changed from nitrofurantoin to trimetoprim thinks about 1 week prior to ov  (last bottle of 30 filled on 01/16/09 per pharmacy records).  Rec was to try taking ppi two times a day and pepcid at bedtime plus delsym and tramadol could not tolerate delsym dizzy   May 05, 2009 cough no better then develop nasal congestion with yellow mucus rx mucinex and feels better rx with amox / prednisone  > much better overall, at least transiently  May 14, 2009 Followup.  Pt states that she woke up very SOB and gasping for air 2 nights ago.  She states that she has flet very "sluggish"- relates this to cough meds.  She still c/o cough - prod with clear sputum.  rec start prednisone 20 until better taper to 10.  May 31, 2009 some better on 20 of prednisone   Pt c/o painful  and frequent urination x 1 wk.  She states that she needs a new med to replace the macrodantin.   Pt also c/o prod cough with clear sputum since this am.  She relates this to allergies.   Current Medications (verified): 1)  Singulair 10 Mg Tabs (Montelukast Sodium) .Marland Kitchen.. 1 Once Daily 2)  Flonase 50 Mcg/act Susp (Fluticasone Propionate) .... 2 Sprays Each Nostril Once Daily 3)  Synthroid 100 Mcg Tabs (Levothyroxine Sodium) .Marland Kitchen.. 1 Once Daily 4)  Mobic 15 Mg Tabs (Meloxicam) .Marland Kitchen.. 1 Once Daily 5)  Tylenol 325 Mg Tabs (Acetaminophen) .... As Needed 6)  Aspirin 81 Mg Tbec (Aspirin) .Marland Kitchen.. 1 Once Daily 7)  Benicar Hct 40-25 Mg Tabs (Olmesartan Medoxomil-Hctz) .... 1/2 Once Daily 8)  Crestor 10 Mg Tabs (Rosuvastatin Calcium) .Marland Kitchen.. 1 Once Daily 9)  Multivitamins  Tabs (Multiple Vitamin) .Marland Kitchen.. 1 Once Daily 10)  Prilosec Otc 20 Mg Tbec (Omeprazole Magnesium) .... Take One 30-60 Min Before First and Last Meals of The Day 11)  Pepcid Ac Maximum Strength 20 Mg Tabs (Famotidine) .... One At Bedtime 12)  Tramadol Hcl 50 Mg  Tabs (Tramadol Hcl) .... One To Two By Mouth Every 4-6 Hours For Cough or Cp 13)  Promethazine-Codeine 6.25-10 Mg/30ml Syrp (Promethazine-Codeine) .Marland Kitchen.. 1 Teaspoon Every 6 Hours As Needed 14)  Prednisone 10 Mg  Tabs (Prednisone) .... Two Each Am Until 100%, 2 Daily X 5 Days, Then 1 For 5 Days Then 1 Over Other Day  Allergies (verified): No Known Drug Allergies  Past History:  Past Medical History: Hyperlipidemia Hypertension GERD    - s/p stretching 2010 Dr Loreta Ave Pulmonary Fibrosis........................................................Marland KitchenWert    - Hx of macrodantin exposure stopped ? 01/2009    - ESR 49   04/14/2009   >  67 May 05, 2009     - Daily prednisone rx May 15, 2009 > .  Vital Signs:  Patient profile:   77 year old female Weight:      168.13 pounds O2 Sat:      96 % on Room air Temp:     97.6 degrees F oral Pulse rate:   87 / minute BP sitting:   148 / 86  (left  arm)  Vitals Entered By: Vernie Murders (May 31, 2009 10:16 AM)  O2 Flow:  Room air  Physical Exam  Additional Exam:  amb argumentatinve wf nad  wt 171 April 14, 2009 > 166 May 05, 2009 > 166 May 14, 2009 > 168 05/31/09 HEENT: nl dentition, turbinates, and orophanx. Nl external ear canals without cough reflex Neck without JVD/Nodes/TM Lungs bilateral insp crackles R > L with cough on insp RRR no s3 or murmur or increase in P2 Abd soft and benign with nl excursion in the supine position. No bruits or organomegaly Ext warm without calf tenderness, cyanosis clubbing or edema Skin warm and dry without lesions     Impression & Recommendations:  Problem # 1:  PULMONARY FIBROSIS ILD POST INFLAMMATORY CHRONIC (ICD-515) esr down from 67  to 21 on relatively  low dose prednisone but not improved by hx which is now and always has been like scooping sand with a fork with this pt.  The goal with a chronic steroid responsive  illness is always arriving at the lowest effective dose that controls the disease/symptoms and not accepting a set "formula" which is based on statistics that don't take into accound individual variability or the natural hx of the dz in every individual patient, which may well vary over time.  See instructions for specific recommendations   Problem # 2:  CYSTITIS, ACUTE (ICD-595.0)  The following medications were removed from the medication list:    Amoxicillin 250 Mg Caps (Amoxicillin) ..... One twice daily x 7 days  Urine culture no sign growth so ok to resume trimethoprim  Problem # 3:  CHRONIC RHINITIS (ICD-472.0) over using afrin.  See instructions for specific recommendations   Medications Added to Medication List This Visit: 1)  Prilosec Otc 20 Mg Tbec (Omeprazole magnesium) .... Take one 30-60 min before first and last meals of the day  Other Orders: T-Urine Culture (Spectrum Order) [id number]) TLB-Sedimentation Rate (ESR) ([id number]) Est.  Patient Level III (09811)  Patient Instructions: 1)  Afrin should only be for 5 days in any one nostril 2)  I emphasized that nasal steroids (Fluticasone/flonase) have no immediate benefit in terms of improving symptoms.  To help them reached the target tissue, the patient should use Afrin two puffs every 12 hours applied one min before using the nasal steroids.  Afrin should be stopped after no more than 5 days.  If the symptoms worsen, Afrin can be restarted after 5 days off of therapy to prevent rebound congestion from overuse of Afrin.  I also emphasized that in no way are nasal  steroids a concern in terms of "addiction". 3)  Prednisone 10 mg 2 daily until 100% better,  then 1 1/2 x 5 days, then 1 daily 4)  Start amoxicillin after the culture and we'll call you the result 5)

## 2010-05-31 NOTE — Progress Notes (Signed)
Summary: thick mucus - ok to use guafenesin  Phone Note Call from Patient   Caller: Patient Call For: Lennan Malone Summary of Call: pt want to know if she can add mucinex to medication she is taking now. she is getting up thick mucus Initial call taken by: Rickard Patience,  May 03, 2009 9:08 AM  Follow-up for Phone Call        Pt c/o head and chest congestion with thick yellow mucus x 1 week, hoarse, and loss of appetite. Pt c/o coughing all Saturday night. Pt states she is drinking plenty of fluids and eating ice chips. Pt denies fever n/v. Pt has an appt with MW on Wednesday and didn't want to wait until then to address same. I informed pt that MW not in office until this PM, and offered to have another MD address same, pt declined and advised she will wait for MW recs. Pt also c/o red "pimple" on right leg with itching, denies broken skin or edema x 1 month. Alternate call back # [phone]. Please advise. Thanks. Zackery Barefoot CMA  May 03, 2009 9:29 AM ok to use guaifenesin in max dose of 2400mg  per day  Follow-up by: Nyoka Cowden MD,  May 03, 2009 2:49 PM  Additional Follow-up for Phone Call Additional follow up Details #1::        Pt informed of MW recs. Pt asked if she should also use Delsym and I advised pt not to due to the other medications she is currently taking.  Zackery Barefoot CMA  May 03, 2009 2:58 PM

## 2010-05-31 NOTE — Progress Notes (Signed)
Summary: Ok to try clariton prn but if not effective restart singulair qd  Phone Note Call from Patient Call back at [phone]   Caller: Patient Call For: wert Reason for Call: Talk to Nurse Summary of Call: need something for her allergies, eyes red, puffy, nothing  to take.  please advise. [facility] [address] Initial call taken by: Eugene Gavia,  July 14, 2009 9:59 AM  Follow-up for Phone Call        Pt c/o eyes being red, itchy, puffy as well as sneezing, runny nose all x 1 week. Pt states she takes benadryl at night, but wants something to take during the day to help. Please advise. Carron Curie CMA  July 14, 2009 10:30 AM try otc clariton prn  and if not effective go back on singulair 10mg  one daily  in evening and stay on it until next ov Follow-up by: Nyoka Cowden MD,  July 14, 2009 1:04 PM  Additional Follow-up for Phone Call Additional follow up Details #1::        Spoke with pt and advised of the above recs per MW. Pt verbalized understanding. Additional Follow-up by: Vernie Murders,  July 14, 2009 1:37 PM

## 2010-05-31 NOTE — Progress Notes (Signed)
Summary: sample of Singulair   Phone Note Call from Patient Call back at Home Phone [phone]   Caller: Patient Call For: wert Summary of Call: pt requests sample of singulair. call home # before 9am or cell [phone] Initial call taken by: Tivis Ringer, CNA,  January 31, 2010 8:53 AM  Follow-up for Phone Call        Singulair not on pt's med list.  Called and spoke with pt.  Pt states she was last seen by [facility] October 25, 2009.  Pt states she was told by MW if she tapered off Prednisone to then continue on Singulair, which she has done.  Pt states her "breathing is doing well" on Singulair.  Pt states she is currently in the "donut hole" and requests either sample of Singulair or an OTC med that MW would recommend.  Will forward message to MW to address.  Aundra Millet Reynolds LPN  January 31, 2010 9:25 AM  there's no harm if doing well in just leaving the singulair off and if flares then come in for eval and we'll give her samples of singulair if we agree it's appropriate. there is no justification for using this med in the records I reviewed Follow-up by: Nyoka Cowden MD,  January 31, 2010 8:18 PM  Additional Follow-up for Phone Call Additional follow up Details #1::        pt aware of mw's response and will call back if symptoms return and schedule ov to be seen Additional Follow-up by: Philipp Deputy CMA,  February 01, 2010 10:26 AM

## 2010-05-31 NOTE — Assessment & Plan Note (Signed)
Summary: Pulmonary/  ext f/u ov start daily prednisone   Copy to:  Dr. Adrian Prince Primary Provider/Referring Provider:  Dr. Adrian Prince  CC:  Followup.  Pt states that she woke up very SOB and gasping for air 2 nights ago.  She states that she has flet very "sluggish"- relates this to cough meds.  She still c/o cough - prod with clear sputum.Marland Kitchen  History of Present Illness: 89 yowf never smoker with h/o rhinitis since 1957 seasonal shots helped  afrin dep and has carried dx of asthma for approx one year rx with singulair which may not have helped the breathing but may have helped her nasal symptoms.  April 14, 2009 cc chest discomfort developed overnight early October(occured one week exposure  to sick people traveling from [place] on a bus/air trip)   worse with breathing assoc with dry cough increase sob got worse over a week and went to Dr Evlyn Kanner dx as pneumonia rx with 2 rounds of abx seemed some better but still has discomfort with deep breath and worse sob with activity.  sob or comfort  worse with deep breath, and lyingdown occurs but it will extinguish as long as lies still, then worse after wakes up to go to BR or with talking.  Changed from nitrofurantoin to trimetoprim thinks about 1 week ago she thinks (last bottle of 30 filled on 01/16/09 per pharmacy records).  Rec was to try taking ppi two times a day and pepcid at bedtime plus delsym and tramadol could not tolerate delsym dizzy.  May 05, 2009 cough no better then develop nasal congestion with yellow mucus rx mucinex and feels better rx with amox / prednisone  > much better overall, at least transiently  May 14, 2009 Followup.  Pt states that she woke up very SOB and gasping for air 2 nights ago.  She states that she has flet very "sluggish"- relates this to cough meds.  She still c/o cough - prod with clear sputum.  Pt denies any significant sore throat, dysphagia, itching, sneezing,  nasal congestion or excess secretions,   fever, chills, sweats, unintended wt loss, pleuritic or exertional cp, hempoptysis, change in activity tolerance  orthopnea pnd or leg swelling Pt also denies any obvious fluctuation in symptoms with weather or environmental change or other alleviating or aggravating factors.          Current Medications (verified): 1)  Singulair 10 Mg Tabs (Montelukast Sodium) .Marland Kitchen.. 1 Once Daily 2)  Flonase 50 Mcg/act Susp (Fluticasone Propionate) .... 2 Sprays Each Nostril Once Daily 3)  Synthroid 100 Mcg Tabs (Levothyroxine Sodium) .Marland Kitchen.. 1 Once Daily 4)  Mobic 15 Mg Tabs (Meloxicam) .Marland Kitchen.. 1 Once Daily 5)  Tylenol 325 Mg Tabs (Acetaminophen) .... As Needed 6)  Aspirin 81 Mg Tbec (Aspirin) .Marland Kitchen.. 1 Once Daily 7)  Benicar Hct 40-25 Mg Tabs (Olmesartan Medoxomil-Hctz) .... 1/2 Once Daily 8)  Crestor 10 Mg Tabs (Rosuvastatin Calcium) .Marland Kitchen.. 1 Once Daily 9)  Multivitamins  Tabs (Multiple Vitamin) .Marland Kitchen.. 1 Once Daily 10)  Prilosec Otc 20 Mg Tbec (Omeprazole Magnesium) .... Take One 30-60 Min Before First and Last Meals of The Day 11)  Pepcid Ac Maximum Strength 20 Mg Tabs (Famotidine) .... One At Bedtime 12)  Tramadol Hcl 50 Mg  Tabs (Tramadol Hcl) .... One To Two By Mouth Every 4-6 Hours For Cough or Cp 13)  Promethazine-Codeine 6.25-10 Mg/64ml Syrp (Promethazine-Codeine) .Marland Kitchen.. 1 Teaspoon Every 6 Hours As Needed 14)  Amoxicillin 250 Mg Caps (  Amoxicillin) .... One Twice Daily X 7 Days  Allergies (verified): No Known Drug Allergies  Past History:  Past Medical History: Hyperlipidemia Hypertension GERD    - s/p stretching 2010 Dr Loreta Ave Pulmonary Fibrosis    - Hx of macrodantin exposure stopped ? 01/2009    - ESR 49   04/14/2009   >  67 May 05, 2009     - Daily prednisone rx May 15, 2009 > .  Vital Signs:  Patient profile:   77 year old female Weight:      166.13 pounds O2 Sat:      97 % on Room air Temp:     97.4 degrees F oral Pulse rate:   77 / minute BP sitting:   130 / 80  (left arm)  Vitals  Entered By: Vernie Murders (May 14, 2009 10:13 AM)  O2 Flow:  Room air  Physical Exam  Additional Exam:  amb argumentatinve wf nad  wt 171 April 14, 2009 > 166 May 05, 2009 > 166 May 14, 2009  HEENT: nl dentition, turbinates, and orophanx. Nl external ear canals without cough reflex Neck without JVD/Nodes/TM Lungs bilateral insp crackles R > L with cough on insp RRR no s3 or murmur or increase in P2 Abd soft and benign with nl excursion in the supine position. No bruits or organomegaly Ext warm without calf tenderness, cyanosis clubbing or edema Skin warm and dry without lesions     Impression & Recommendations:  Problem # 1:  PULMONARY FIBROSIS ILD POST INFLAMMATORY CHRONIC (ICD-515) Marked elevation of esr suggests she may have some lingering element of inflammation despite stopping macrodantin months ago, at least to the extent we're sure she stopped it because she's not even sure she took it though pharmacy records suggest otherwise. Interview is always very difficult because Patient fails  to answer any single question asked in a straightforward manner, tending to go off on tangents or answer questions with ambiguous medical terms or diagnoses and seemed upset when asked the same question more than once for clarification.    I had an extended discussion with the patient today lasting 15 to 20 minutes of a 25 minute visit on the following issues:  The goal with a chronic steroid responsive illness is always arriving at the lowest effective dose that controls the disease/symptoms and not accepting a set "formula" which is based on statistics that don't take into accound individual variability or the natural hx of the dz in every individual patient, which may well vary over time.   For now use ceiling of 20 and floor of 5 mg every other day   Problem # 2:  GERD (ICD-530.81) we know she has it because she required esophageal dilatation recently.  Her updated medication list  for this problem includes:    Prilosec Otc 20 Mg Tbec (Omeprazole magnesium) .Marland Kitchen... Take one 30-60 min before first and last meals of the day    Pepcid Ac Maximum Strength 20 Mg Tabs (Famotidine) ..... One at bedtime  There is an association between gerd and pf statistically that does not necessarily imply cause and effect but to be on the safe side have advised aggressive rx with diet and ppi since there are so few other opportunities to help pts with PF.   Medications Added to Medication List This Visit: 1)  Prednisone 10 Mg Tabs (Prednisone) .... Two each am until 100%, 2 daily x 5 days, then 1 for 5 days then 1  over other day  Other Orders: Est. Patient Level IV (34742)  Patient Instructions: 1)   For now ceiling for prednsione is 20 mg per day and the floor 5 mg every other day 2)  Please schedule a follow-up appointment in 6 weeks, sooner if needed with cxr  Prescriptions: PREDNISONE 10 MG  TABS (PREDNISONE) Two each am until 100%, 2 daily x 5 days, then 1 for 5 days then 1 over other day  #100 x 0   Entered and Authorized by:   Nyoka Cowden MD   Signed by:   Nyoka Cowden MD on 05/14/2009   Method used:   Electronically to        [facility]  [address] [phone]* (retail)       [address]       [place], [place]  [address]       Ph: [phone]       Fax: [phone]   RxID:   [id number]

## 2010-05-31 NOTE — Progress Notes (Signed)
Summary: returned call  Phone Note Call from Patient Call back at Home Phone [phone]   Caller: Patient Call For: wert Summary of Call: pt returned call from leslie Initial call taken by: Tivis Ringer,  May 07, 2009 12:54 PM  Follow-up for Phone Call        Done. Spoke with pt and made aware of results/recs. Follow-up by: Vernie Murders,  May 07, 2009 1:03 PM

## 2010-05-31 NOTE — Assessment & Plan Note (Signed)
Summary: Pulmonary/ f/u pf try qod pred   Copy to:  Dr. Adrian Prince Primary Provider/Referring Provider:  Dr. Adrian Prince  CC:  Pt would like to discuss medications and how to take them, pt states her bones is hruting all the time, when pt walks her knees is popping again, pain in the hip, and pt states she needs sanmples of singualr and benicar bc her insurance doesnt pay for it.  History of Present Illness: 79 yowf never smoker with h/o rhinitis since 1957 seasonal shots helped  afrin dep and has carried dx of asthma for approx one year rx with singulair which may not have helped the breathing but may have helped her nasal symptoms.  April 14, 2009 cc chest discomfort developed overnight early October(occured one week exposure  to sick people traveling from [place] on a bus/air trip)   worse with breathing assoc with dry cough increase sob got worse over a week and went to Dr Evlyn Kanner dx as pneumonia rx with 2 rounds of abx seemed some better but still has discomfort with deep breath and worse sob with activity.  sob or comfort  worse with deep breath, and lying down occurs but it will extinguish as long as lies still, then worse after wakes up to go to BR or with talking.  Changed from nitrofurantoin to trimetoprim thinks about 1 week prior to ov  (last bottle of 30 filled on 01/16/09 per pharmacy records).  Rec was to try taking ppi two times a day and pepcid at bedtime plus delsym and tramadol could not tolerate delsym dizzy   May 05, 2009 cough no better then develop nasal congestion with yellow mucus rx mucinex and feels better rx with amox / prednisone  > much better overall, at least transiently  May 14, 2009 Followup.  Pt states that she woke up very SOB and gasping for air 2 nights ago.  She states that she has flet very "sluggish"- relates this to cough meds.  She still c/o cough - prod with clear sputum.  rec start prednisone 20 until better taper to 10.  May 31, 2009 some  better on 20 of prednisone  tapered off (instructions said to hold at one). worse w/in a week off.   see page 2March 30, 2011 6 wk followup.  Pt states that her cough is about the same- now she is coughing up clear sputum- realtes this change to the rainy weather and allergies.  She states that her breathing had been okay but worse now since rainy weather. did worse off singulair in terms of "allergies and sinus" so restarted.  rec taper prednisone to 10 mg one half daily when better  Sep 09, 2009 Pt would like to discuss medications and how to take them, pt states her bones is hruting all the time, when pt walks her knees is popping again, pain in the hip, pt states she needs sanmples of singualr and benicar bc her insurance doesnt pay for it.  walking flat ok trouble with steps but overall ex tol fine and no loss of function with decrease in prednisone or increase cough. Pt denies any significant sore throat, dysphagia, itching, sneezing,  nasal congestion or excess secretions,  fever, chills, sweats, unintended wt loss, pleuritic or exertional cp, hempoptysis, change in activity tolerance  orthopnea pnd or leg swelling. Pt also denies any obvious fluctuation in symptoms with weather or environmental change or other alleviating or aggravating factors.  Current Medications (verified): 1)  Flonase 50 Mcg/act Susp (Fluticasone Propionate) .... 2 Sprays Each Nostril Once Daily 2)  Synthroid 100 Mcg Tabs (Levothyroxine Sodium) .Marland Kitchen.. 1 Once Daily 3)  Mobic 15 Mg Tabs (Meloxicam) .Marland Kitchen.. 1 Once Daily 4)  Tylenol 325 Mg Tabs (Acetaminophen) .... As Needed 5)  Aspirin 81 Mg Tbec (Aspirin) .Marland Kitchen.. 1 Once Daily 6)  Crestor 10 Mg Tabs (Rosuvastatin Calcium) .Marland Kitchen.. 1 Once Daily 7)  Multivitamins  Tabs (Multiple Vitamin) .Marland Kitchen.. 1 Once Daily 8)  Prilosec Otc 20 Mg Tbec (Omeprazole Magnesium) .... Take  One 30-60 Min Before First Meal of The Day 9)  Pepcid Ac Maximum Strength 20 Mg Tabs (Famotidine) .... One At  Bedtime 10)  Tramadol Hcl 50 Mg  Tabs (Tramadol Hcl) .... One To Two By Mouth Every 4-6 Hours For Cough or Cp 11)  Promethazine-Codeine 6.25-10 Mg/33ml Syrp (Promethazine-Codeine) .Marland Kitchen.. 1 Teaspoon Every 6 Hours As Needed 12)  Trimethoprim 100 Mg Tabs (Trimethoprim) .... As Needed 13)  Prednisone 10 Mg  Tabs (Prednisone) .... 1/2 Daily 14)  Singulair 10 Mg Tabs (Montelukast Sodium) .Marland Kitchen.. 1 Once Daily 15)  Benicar Hct 40-25 Mg  Tabs (Olmesartan Medoxomil-Hctz) .... One Half Daily 16)  Glucosomine Chondrotin Msg Maxium  1500-1200-500 .... 2 Daily  Allergies (verified): No Known Drug Allergies  Past History:  Past Medical History: Hyperlipidemia Hypertension GERD    - s/p stretching 2010 Dr Loreta Ave Pulmonary Fibrosis........................................................Marland KitchenWert    - Hx of macrodantin exposure stopped ? 01/2009    - ESR 49   04/14/2009   >  67 May 05, 2009 > 32 Sep 09, 2009     - Daily prednisone rx May 15, 2009 > some better then worse after stopped 06/18/09    - Restart daily prednisone June 26, 2009 >> .  Vital Signs:  Patient profile:   77 year old female Height:      62 inches (157.48 cm) Weight:      175 pounds (79.55 kg) BMI:     32.12 O2 Sat:      96 % on Room air Temp:     97.5 degrees F (36.39 degrees C) oral Pulse rate:   68 / minute BP sitting:   140 / 88  (right arm) Cuff size:   regular  Vitals Entered By: Carver Fila SMA (Sep 09, 2009 10:29 AM)  O2 Flow:  Room air  Serial Vital Signs/Assessments:  Comments: 11:40 AM Ambulatory Pulse Oximetry  Resting; HR__60___    02 Sat__96%ra___  Lap1 (185 feet)   HR__120___   02 Sat__91%ra___ Lap2 (185 feet)   HR__97___   02 Sat__90%ra___    Lap3 (185 feet)   HR__100___   02 Sat__89%ra___  _x__Test Completed without Difficulty ___Test Stopped due to:   By: Vernie Murders   CC: Pt would like to discuss medications and how to take them, pt states her bones is hruting all the time, when pt  walks her knees is popping again, pain in the hip, pt states she needs sanmples of singualr and benicar bc her insurance doesnt pay for it Comments Meds and allergies updated Carver Fila SMAMay 12, 2011 10:36 AM    Physical Exam  Additional Exam:  amb wf nad  .   wt 171 April 14, 2009 > 166 May 05, 2009 > 168 05/31/09 > 170 June 26, 2009 > 175 July 29, 2009 > 175 Sep 09, 2009  HEENT: nl dentition, turbinates, and orophanx. Nl external ear canals without  cough reflex Neck without JVD/Nodes/TM Lungs bilateral insp crackles R > L with cough on insp RRR no s3 or murmur or increase in P2 Abd soft and benign with nl excursion in the supine position. No bruits or organomegaly Ext warm without calf tenderness, cyanosis clubbing or edema Skin warm and dry without lesions     White Cell Count          9.1 K/uL                    4.5-10.5   Red Cell Count            4.57 Mil/uL                 3.87-5.11   Hemoglobin                13.8 g/dL                   21.3-08.6   Hematocrit                40.4 %                      36.0-46.0   MCV                       88.5 fl                     78.0-100.0   MCHC                      34.0 g/dL                   57.8-46.9   RDW                  [H]  14.9 %                      11.5-14.6   Platelet Count            263.0 K/uL                  150.0-400.0   Neutrophil %         [H]  78.3 %                      43.0-77.0   Lymphocyte %              15.0 %                      12.0-46.0   Monocyte %                5.8 %                       3.0-12.0   Eosinophils%              0.7 %                       0.0-5.0   Basophils %               0.2 %                       0.0-3.0   Neutrophill Absolute      7.1  K/uL                    1.4-7.7   Lymphocyte Absolute       1.4 K/uL                    0.7-4.0   Monocyte Absolute         0.5 K/uL                    0.1-1.0  Eosinophils, Absolute                             0.1 K/uL                     0.0-0.7   Basophils Absolute        0.0 K/uL                    0.0-0.1  Tests: (2) Sed Rate (ESR)   Sed Rate             [H]  32 mm/hr                    0-22  CXR  Procedure date:  09/09/2009  Findings:      Similar appearance of interstitial lung disease, likely usual interstitial pneumonitis or less likely nonspecific interstitial pneumonitis.  No evidence of acute superimposed process.   Cardiomegaly without congestive failure.  Impression & Recommendations:  Problem # 1:  PULMONARY FIBROSIS ILD POST INFLAMMATORY CHRONIC (ICD-515) esr 21  >  32  Sep 09, 2009  with prednisone down to 5 mg /day and sats worse also but not she's very concerned re systemic side effects so ok to try every other day dosing to see if symptomatically worsens since she's not convinced she's really better.  Each maintenance medication was reviewed in detail including most importantly the difference between maintenance prns and under what circumstances the prns are to be used.  In addition, these two groups (for which the patient should keep up with refills) were distinguished from a third group :  meds that are used only short term with the intent to complete a course of therapy and then not refill them.  The med list was then fully reconciled and reorganized to reflect this important distinction.   Medications Added to Medication List This Visit: 1)  Prednisone 10 Mg Tabs (Prednisone) .... 1/2  on even days 2)  Prednisone 10 Mg Tabs (Prednisone) .... 1/2 daily 3)  Glucosomine Chondrotin Msg Maxium 1500-1200-500  .... 2 daily  Other Orders: T-2 View CXR (71020TC) Pulse Oximetry, Ambulatory (16109) TLB-CBC Platelet - w/Differential (85025-CBCD) TLB-Sedimentation Rate (ESR) (85652-ESR) T-Allergy Profile Region [place], [place], [place], [place], [place] (5484) Est. Patient Level IV (60454) Pulse Oximetry, Ambulatory (09811)  Patient Instructions: 1)  Try prednisone 10 mg one half even days 2)  Please schedule a follow-up  appointment in 6 weeks, sooner if needed    Immunization History:  Influenza Immunization History:    Influenza:  historical (01/29/2009)    CXR  Procedure date:  09/09/2009  Findings:      Similar appearance of interstitial lung disease, likely usual interstitial pneumonitis or less likely nonspecific interstitial pneumonitis.  No evidence of acute superimposed process.   Cardiomegaly without congestive failure.

## 2010-05-31 NOTE — Letter (Signed)
Summary: [facility]   Imported By: Sherian Rein 12/21/2009 10:58:18  _____________________________________________________________________  External Attachment:    Type:   Image     Comment:   External Document

## 2010-05-31 NOTE — Assessment & Plan Note (Signed)
Summary: Pulmonary/ ext f/u ov with walking sat ra = ok   Copy to:  Dr. Adrian Prince Primary Provider/Referring Provider:  Dr. Adrian Prince  CC:  6 wk followup.  Pt states that her cough is about the same- now she is coughing up cleatr sputum- realtes this change to the rainy weather and allergies.  She states that her breathing had been okay but worse now since rainy weather..  History of Present Illness: 76 yowf never smoker with h/o rhinitis since 1957 seasonal shots helped  afrin dep and has carried dx of asthma for approx one year rx with singulair which may not have helped the breathing but may have helped her nasal symptoms.  April 14, 2009 cc chest discomfort developed overnight early October(occured one week exposure  to sick people traveling from [place] on a bus/air trip)   worse with breathing assoc with dry cough increase sob got worse over a week and went to Dr Evlyn Kanner dx as pneumonia rx with 2 rounds of abx seemed some better but still has discomfort with deep breath and worse sob with activity.  sob or comfort  worse with deep breath, and lying down occurs but it will extinguish as long as lies still, then worse after wakes up to go to BR or with talking.  Changed from nitrofurantoin to trimetoprim thinks about 1 week prior to ov  (last bottle of 30 filled on 01/16/09 per pharmacy records).  Rec was to try taking ppi two times a day and pepcid at bedtime plus delsym and tramadol could not tolerate delsym dizzy   May 05, 2009 cough no better then develop nasal congestion with yellow mucus rx mucinex and feels better rx with amox / prednisone  > much better overall, at least transiently  May 14, 2009 Followup.  Pt states that she woke up very SOB and gasping for air 2 nights ago.  She states that she has flet very "sluggish"- relates this to cough meds.  She still c/o cough - prod with clear sputum.  rec start prednisone 20 until better taper to 10.  May 31, 2009 some better  on 20 of prednisone  tapered off (instructions said to hold at one). worse w/in a week off.   see page 2March 30, 2011 6 wk followup.  Pt states that her cough is about the same- now she is coughing up clear sputum- realtes this change to the rainy weather and allergies.  She states that her breathing had been okay but worse now since rainy weather. did worse off singulair in terms of "allergies and sinus" so restarted.  Worse also after changed to losartan from benicar, but mostly c/o red in face, bp too high, no increase cough "other than from allergies".  Pt denies any significant sore throat, dysphagia, itching, sneezing,   fever, chills, sweats, unintended wt loss, pleuritic or exertional cp, hempoptysis, change in activity tolerance  orthopnea pnd or leg swelling.  Current Medications (verified): 1)  Flonase 50 Mcg/act Susp (Fluticasone Propionate) .... 2 Sprays Each Nostril Once Daily 2)  Synthroid 100 Mcg Tabs (Levothyroxine Sodium) .Marland Kitchen.. 1 Once Daily 3)  Mobic 15 Mg Tabs (Meloxicam) .Marland Kitchen.. 1 Once Daily 4)  Tylenol 325 Mg Tabs (Acetaminophen) .... As Needed 5)  Aspirin 81 Mg Tbec (Aspirin) .Marland Kitchen.. 1 Once Daily 6)  Losartan (? Strength) .Marland Kitchen.. 1 Once Daily 7)  Crestor 10 Mg Tabs (Rosuvastatin Calcium) .Marland Kitchen.. 1 Once Daily 8)  Multivitamins  Tabs (Multiple Vitamin) .Marland KitchenMarland KitchenMarland Kitchen 1  Once Daily 9)  Prilosec Otc 20 Mg Tbec (Omeprazole Magnesium) .... Take  One 30-60 Min Before First Meal of The Day 10)  Pepcid Ac Maximum Strength 20 Mg Tabs (Famotidine) .... One At Bedtime 11)  Tramadol Hcl 50 Mg  Tabs (Tramadol Hcl) .... One To Two By Mouth Every 4-6 Hours For Cough or Cp 12)  Promethazine-Codeine 6.25-10 Mg/55ml Syrp (Promethazine-Codeine) .Marland Kitchen.. 1 Teaspoon Every 6 Hours As Needed 13)  Trimethoprim 100 Mg Tabs (Trimethoprim) .... As Needed 14)  Prednisone 10 Mg  Tabs (Prednisone) .... Take As Directed On Instruction Sheet 15)  Singulair 10 Mg Tabs (Montelukast Sodium) .Marland Kitchen.. 1 Once Daily  Allergies (verified): No  Known Drug Allergies  Past History:  Past Medical History: Hyperlipidemia Hypertension GERD    - s/p stretching 2010 Dr Loreta Ave Pulmonary Fibrosis........................................................Marland KitchenWert    - Hx of macrodantin exposure stopped ? 01/2009    - ESR 49   04/14/2009   >  67 May 05, 2009     - Daily prednisone rx May 15, 2009 > some better then worse after stopped 06/18/09    - Restart daily prednisone June 26, 2009 >> .  Vital Signs:  Patient profile:   77 year old female Weight:      175 pounds O2 Sat:      97 % on Room air Temp:     97.5 degrees F oral Pulse rate:   72 / minute BP sitting:   132 / 80  (left arm)  Vitals Entered By: Vernie Murders (July 28, 2009 10:28 AM)  O2 Flow:  Room air  Serial Vital Signs/Assessments:  Comments: 11:06 AM Ambulatory Pulse Oximetry  Resting; HR__60___    02 Sat__97%ra___  Lap1 (185 feet)   HR__94___   02 Sat__92%ra___ Lap2 (185 feet)   HR__102___   02 Sat__93%ra___    Lap3 (185 feet)   HR__102___   02 Sat__92%ra___  _x__Test Completed without Difficulty ___Test Stopped due to:   By: Vernie Murders    Physical Exam  Additional Exam:  amb wf nad  who  failed to answer a single question asked in a straightforward manner, tending to go off on tangents or answer questions with ambiguous medical terms or diagnoses and seemed upset when asked the same question more than once for clarification.   wt 171 April 14, 2009 > 166 May 05, 2009 > 166 May 14, 2009 > 168 05/31/09 > 170 June 26, 2009 > 175 July 29, 2009  HEENT: nl dentition, turbinates, and orophanx. Nl external ear canals without cough reflex Neck without JVD/Nodes/TM Lungs bilateral insp crackles R > L with cough on insp RRR no s3 or murmur or increase in P2 Abd soft and benign with nl excursion in the supine position. No bruits or organomegaly Ext warm without calf tenderness, cyanosis clubbing or edema Skin warm and dry without  lesions     Impression & Recommendations:  Problem # 1:  PULMONARY FIBROSIS ILD POST INFLAMMATORY CHRONIC (ICD-515)  esr previously  down from 67  to 21 on relatively  low doses of  prednisone but somwhat  improved by hx which is now and always has been like scooping sugar  with a fork with this pt, same with giving her instructions     The goal with a chronic steroid responsive  illness is always arriving at the lowest effective dose that controls the disease/symptoms and not accepting a set "formula" which is based on statistics that don't take  into accound individual variability or the natural hx of the dz in every individual patient, which may well vary over time.  See instructions for specific recommendations to try again 20 mg per day as a ceiling and 5 mg per day as a floor once her usual"allergy season" is over.  Note absence of desat today a very good sign and hopefully this is macrodantin related ild not IPF so should stabilize off marcodantin and not remain steroid dep.  Problem # 2:  CHRONIC RHINITIS (ICD-472.0)  rx singulair and Nasal steroids  Orders: Est. Patient Level IV (16109)  Problem # 3:  HYPERTENSION (ICD-401.9)  Her updated medication list for this problem includes:    Benicar Hct 40-25 Mg Tabs (Olmesartan medoxomil-hctz) ..... One half daily  Samples x 8 weeks at her request  Medications Added to Medication List This Visit: 1)  Losartan (? Strength)  .Marland Kitchen.. 1 once daily 2)  Singulair 10 Mg Tabs (Montelukast sodium) .Marland Kitchen.. 1 once daily 3)  Benicar Hct 40-25 Mg Tabs (Olmesartan medoxomil-hctz) .... One half daily  Other Orders: Pulse Oximetry, Ambulatory (60454)  Patient Instructions: 1)  Continue to get as much exercise as possible and once allergy season is over reduce prednisone by half 2)  Stop losartan 3)  Restart Benicar 40/25 one half daily until return in 6 weeks

## 2010-06-02 NOTE — Letter (Signed)
Summary: [facility]  [facility]   Imported By: Lester Bucklin 05/11/2010 10:52:30  _____________________________________________________________________  External Attachment:    Type:   Image     Comment:   External Document

## 2010-06-02 NOTE — Letter (Signed)
Summary: P 2 of note/[facility]  P 2 of note/[facility]   Imported By: Lester [place] 05/11/2010 11:00:24  _____________________________________________________________________  External Attachment:    Type:   Image     Comment:   External Document

## 2010-08-09 LAB — BASIC METABOLIC PANEL
Calcium: 9.2 mg/dL (ref 8.4–10.5)
GFR calc Af Amer: >60 mL/min (ref >60)
GFR calc non Af Amer: >60 mL/min (ref >60)
Sodium: 138 mEq/L (ref 135–145)

## 2010-08-09 LAB — DIFFERENTIAL
Basophils Absolute: 0 10*3/uL (ref 0.0–0.1)
Lymphocytes Relative: 18 % (ref 12–46)
Monocytes Absolute: 0.5 10*3/uL (ref 0.1–1.0)
Monocytes Relative: 6 % (ref 3–12)
Neutro Abs: 6.1 10*3/uL (ref 1.7–7.7)
Neutrophils Relative %: 74 % (ref 43–77)

## 2010-08-09 LAB — CBC
Hemoglobin: 12.8 g/dL (ref 12.0–15.0)
RBC: 4.3 MIL/uL (ref 3.87–5.11)
WBC: 8.2 10*3/uL (ref 4.0–10.5)

## 2010-08-26 ENCOUNTER — Encounter: Payer: Self-pay | Admitting: Internal Medicine

## 2010-08-29 ENCOUNTER — Encounter: Payer: Self-pay | Admitting: Internal Medicine

## 2010-08-29 ENCOUNTER — Ambulatory Visit (INDEPENDENT_AMBULATORY_CARE_PROVIDER_SITE_OTHER)
Admission: RE | Admit: 2010-08-29 | Discharge: 2010-08-29 | Disposition: A | Discharge status: Home / Self Care | Payer: Medicare Other | Source: Ambulatory Visit | Attending: Internal Medicine | Admitting: Internal Medicine

## 2010-08-29 ENCOUNTER — Ambulatory Visit (INDEPENDENT_AMBULATORY_CARE_PROVIDER_SITE_OTHER): Payer: Medicare Other | Admitting: Internal Medicine

## 2010-08-29 VITALS — BP 142/80 | HR 70 | Temp 97.8°F | Ht 62.0 in | Wt 172.6 lb

## 2010-08-29 DIAGNOSIS — J8410 Pulmonary fibrosis, unspecified: Secondary | ICD-10-CM

## 2010-08-29 DIAGNOSIS — R05 Cough: Secondary | ICD-10-CM

## 2010-08-29 NOTE — Progress Notes (Signed)
Subjective:    Patient ID: Kayla Wells, female    DOB: 1929-11-10, 77 y.o.   MRN: [id number]  HPI    54 yowf never smoker with h/o rhinitis since 1957 seasonal shots helped afrin dep and has carried dx of asthma for rx with singulair which may not have helped the breathing but may have helped her nasal symptoms.   April 14, 2009 cc chest discomfort developed overnight early October(occured one week exposure to sick people traveling from [place] on a bus/air trip) worse with breathing assoc with dry cough increase sob got worse over a week and went to Dr Evlyn Kanner dx as pneumonia rx with 2 rounds of abx seemed some better but still has discomfort with deep breath and worse sob with activity. dx ? ILD ? from [facility] with upper airway coughing rx as uacs with gerd rx but not adherent.   May 14, 2009 Followup. Pt states that she woke up very SOB and gasping for air 2 nights prior to ov. She states that she has flet very "sluggish"- relates this to cough meds. She still c/o cough - prod with clear sputum. rec start prednisone 20 until better taper to 10.   May 31, 2009 some better on 20 of prednisone tapered off (instructions said to hold at one). worse w/in a week off.   July 28, 2009 6 wk followup. Pt states that her cough is about the same- now she is coughing up clear sputum- realtes this change to the rainy weather and allergies. She states that her breathing had been okay but worse now since rainy weather. did worse off singulair in terms of "allergies and sinus" so restarted. rec taper prednisone to 10 mg one half daily when better   October 25, 2009 tapered completely off prednisone June 2 no flare of cough or sob. No desats walking so f/u prn  08/29/2010 ov / Charnese Federici fine until 3 weeks ago with pain in chest typical of hb then cough got real bad and throat closed p eating fish, no excess sputum.  Sleeping ok without nocturnal  or early am exac of resp c/o's or need for noct saba.  Pt  denies any significant sore throat, dysphagia, itching, sneezing,  nasal congestion or excess/ purulent secretions,  fever, chills, sweats, unintended wt loss, pleuritic or exertional cp, hempoptysis, orthopnea pnd or leg swelling.    Also denies any obvious fluctuation of symptoms with weather or environmental changes or other aggravating or alleviating factors.       Past Medical History:  Hyperlipidemia  Hypertension  GERD  - s/p stretching 2010 Dr Loreta Ave  Pulmonary Fibrosis........................................................Marland KitchenWert  - Hx of macrodantin exposure stopped ? 01/2009  - ESR 49 04/14/2009 > 67 May 05, 2009 > 32 Sep 09, 2009  - Daily prednisone rx May 15, 2009 > some better then worse after stopped 06/18/09  - Restart daily prednisone June 26, 2009 >> tapered off 09/30/09 > no relapse of symptoms  - Desat walking 08/29/2010  p 2 laps but not change on cxr .       Review of Systems     Objective:   Physical Exam amb wf who failed to answer a single question asked in a straightforward manner, tending to go off on tangents or answer questions with ambiguous medical terms or diagnoses and seemed aggravated  when asked the same question more than once for clarification.  wt 171 April 14, 2009 > > 175 July 29, 2009 > 175 Sep 09, 2009 > 173 October 25, 2009 > 08/29/2010 172 HEENT: nl dentition, turbinates, and orophanx. Nl external ear canals without cough reflex  Neck without JVD/Nodes/TM  Lungs minimal bilateral insp crackles R > L with no cough now on insp  RRR no s3 or murmur or increase in P2  Abd soft and benign with nl excursion in the supine position. No bruits or organomegaly  Ext warm without calf tenderness, cyanosis clubbing or edema  Skin warm and dry without lesions       Assessment & Plan:

## 2010-08-29 NOTE — Assessment & Plan Note (Signed)
Despite the pf on cxr this cough has typical features of  Classic Upper airway cough syndrome, so named because it's frequently impossible to sort out how much is  CR/sinusitis with freq throat clearing (which can be related to primary GERD)   vs  causing  secondary (" extra esophageal")  GERD from wide swings in gastric pressure that occur with throat clearing, often  promoting self use of mint and menthol lozenges that reduce the lower esophageal sphincter tone and exacerbate the problem further in a cyclical fashion.   These are the same pts who not infrequently have failed to tolerate ace inhibitors,  dry powder inhalers or biphosphonates or report having reflux symptoms that don't respond to standard doses of PPI , and are easily confused as having aecopd or asthma flares,   Of the three most common causes of chronic cough, only one (GERD)  can actually cause the other two (asthma and post nasal drip syndrome)  and perpetuate the cylce of cough inducing airway trauma, inflammation, heightened sensitivity to reflux which is prompted by the cough itself via a cyclical mechanism.    This may partially respond to steroids and look like asthma and post nasal drainage but never erradicated completely unless the cough and the secondary reflux are eliminated, preferably both at the same time.  While not intuitively obvious, many patients with chronic low grade reflux do not cough until there is a secondary insult that disturbs the protective epithelial barrier and exposes sensitive nerve endings.  This can be viral or direct physical injury such as with an endotracheal tube.   The point is that once this occurs, it is difficult to eliminate using anything but a maximally effective acid suppression regimen at least in the short run, accompanied by an appropriate diet to address non acid GERD.   See instructions for specific recommendations which were reviewed directly with the patient who was given a copy with  highlighter outlining the key components.

## 2010-08-29 NOTE — Patient Instructions (Addendum)
Use of PPI is associated with improved survival time and with decreased radiologic fibrosis per King's study published in AJRCCM vol 184 p1390.  Dec 2011  This may not be cause and effect, but given how universally unhelpful all the otherstudy drugs have been for pulmonary fibrosis,   I recommend you maintain prilosec 20mg  30-60 min before eating first and in the event of flare up I want you to increase the prilosec to 20 mg Take 30- 60 min before your first and last meals of the day and Pepcid 20 mg one at bedtime ( reflux is to respiratory conditions what oxygen is to fire)  If you start coughing a lot in addition the acid treat ment I also recommend you use  delsym two tsp every 12 hours and supplement if needed with  tramadol 50 mg up to 2 every 4 hours to suppress the urge to cough. Swallowing water or using ice chips/non mint and menthol containing candies (such as lifesavers or sugarless jolly ranchers) are also effective.  You should rest your voice and avoid activities that you know make you cough.  Once you have eliminated the cough for 3 straight days try reducing the tramadol first,  then the delsym as tolerated.      Please schedule a follow up office visit in 4 weeks, sooner if needed with pft's

## 2010-08-29 NOTE — Progress Notes (Signed)
Quick Note:  Spoke with pt and notified of results per Dr. Wert. Pt verbalized understanding and denied any questions.  ______ 

## 2010-08-29 NOTE — Assessment & Plan Note (Signed)
DDx for pulmonary fibrosis  includes idiopathic pulmonary fibrosis, pulmonary fibrosis associated with rheumatologic diseases (which have a relatively benign course in most cases) , adverse effect from  drugs such as chemotherapy or amiodarone exposure, nonspecific interstitial pneumonia which is typically steroid responsive, and chronic hypersensitivity pneumonitis.   In active  smokers Langerhan's Cell  Histiocyctosis (eosinophilic granuomatosis),  DIP,  and Respiratory Bronchiolitis ILD also need to be considered,    Not clear this uip but clearly not acute or chronic macrodantin because this time no h/o exposure.  exac of symptoms with typical gerd event intriguing in light of the report that use of PPI is associated with improved survival time and with decreased radiologic fibrosis per King's study published in [facility] vol 184 p1390.  Dec 2011  This may not be cause and effect, but given how universally unhelpful all the otherstudy drugs have been for pf,   rec start  rx ppi / diet/ lifestyle modification.

## 2010-09-09 ENCOUNTER — Encounter: Payer: Self-pay | Admitting: Internal Medicine

## 2010-09-13 NOTE — Consult Note (Signed)
Kayla Wells, Kayla Wells NO.:  [id number]   MEDICAL RECORD NO.:  [id number]          PATIENT TYPE:  EMS   LOCATION:  ED                           FACILITY:  [facility]   PHYSICIAN:  Madlyn Frankel. Charlann Boxer, M.D.  DATE OF BIRTH:  December 07, 1929   DATE OF CONSULTATION:  09/08/2008  DATE OF DISCHARGE:                                 CONSULTATION   CHIEF COMPLAINT:  Dislocated right total hip replacement.   BRIEF HISTORY:  Kayla Wells is a 77 year old female with a history of a  right total hip replacement in December 2008.  This was complicated by  dislocation on January 10, 2008, closed reduced by Dr. Beverely Low.  She had been doing well, obtaining range of motion.  Recent follow up by  Dr. Lequita Halt a week ago had indicated a stable hip.  She was putting away  some clothes today when sitting in a chair and felt a pop in her hip.   Based on her previous history, it was convinced that it had dislocated.  She contacted [place] directly and was brought to the ER by EMS.  The  radiographs in the emergency room confirmed concern about dislocation.  She had no other major complaints at this time.  A little bit of  numbness in her foot, however, she is able to feel on exam.   PAST MEDICAL HISTORY:  1. Fibromyalgia.  2. Anxiety.  3. Hypertension.  4. Reflux disease.  5. Diverticulosis.  6. Hemorrhoids.  7. Incontinence.  8. Hypothyroidism.  9. Osteoarthritis.  10.Hypercholesterolemia.   FAMILY MEDICAL HISTORY:  Includes coronary artery disease and arthritis.   SOCIAL HISTORY:  She is married.  Denies smoking or alcohol use.   DRUG ALLERGIES:  NONE.   CURRENT MEDICATIONS:  Synthroid, Crestor, Benicar, tramadol, gabapentin,  aspirin, nasal spray, Viactiv, Multivitamin, calcium plus vitamin D,  Flonase, Singulair and some Xanax occasionally.   PAST SURGICAL HISTORY:  1. Right total hip replacement in December 2008.  2. Hysterectomy.  3. Foot surgery.  4. History of knee scope.  5.  Colonoscopy.  6. Esophageal dilatation.   REVIEW OF SYSTEMS:  Otherwise unremarkable.  She has been healthy with  no reported upper respiratory, pulmonary, gastric or genitourinary  issues over the past 2 weeks.   PHYSICAL EXAMINATION:  GENERAL:  Finds her to be on the hospital  stretcher, awake, alert and oriented with a pleasant disposition.  She  is in obvious discomfort with this hip pain.  RIGHT LOWER EXTREMITY:  Examination of the right lower extremity reveals  that she was shortened and internally rotated.  She had palpable pulses  and able to move her ankle well with active dorsiflexion.  She had  intact sensibility to light touch.  Other examination is within normal  limits.   Radiographs and AP pelvis in the emergency room indicated a dislocated  right total hip replacement with superior migration.  No evidence of any  periprosthetic fracture.  No evidence of any component malposition or  change in position.   ASSESSMENT:  Right dislocated total hip replacement.   PLAN:  At this point, we are going to attempt in the emergency room a  closed reduction under conscious sedation with plans to go home with a  knee immobilizer.  She will then will follow with Dr. Lequita Halt in a  couple weeks.  Questions were encouraged and answered and reviewed with  the patient and the family in the ER.      Madlyn Frankel Charlann Boxer, M.D.  Electronically Signed     MDO/MEDQ  D:  09/08/2008  T:  09/09/2008  Job:  [id number]   cc:   Ollen Gross, M.D.  Fax: [phone]

## 2010-09-13 NOTE — Op Note (Signed)
Kayla Wells, Kayla Wells                ACCOUNT NO.:  [id number]   MEDICAL RECORD NO.:  [id number]          PATIENT TYPE:  INP   LOCATION:  X004                         FACILITY:  [facility]   PHYSICIAN:  Ollen Gross, M.D.    DATE OF BIRTH:  Sep 15, 1929   DATE OF PROCEDURE:  04/12/2007  DATE OF DISCHARGE:                               OPERATIVE REPORT   PREOPERATIVE DIAGNOSIS:  Avascular necrosis, right hip.   POSTOPERATIVE DIAGNOSIS:  Avascular necrosis, right hip.   PROCEDURE:  Right total hip arthroplasty.   SURGEON:  Ollen Gross, M.D.   ASSISTANT:  Avel Peace PA-C   ANESTHESIA:  Spinal.   ESTIMATED BLOOD LOSS:  Four hundred fifty.   DRAIN:  Hemovac x1.   COMPLICATIONS:  None.   CONDITION:  Stable to recovery.   CLINICAL NOTE:  Kayla Wells is a 77 year old female who has osteonecrosis  of the right hip with flattening of the femoral head, severe pain and  degenerative change.  She presents now for right total hip arthroplasty.   PROCEDURE IN DETAIL:  After the successful administration of spinal  anesthetic, the patient was placed in the left lateral decubitus  position with the right side up and held with the hip positioner.  The  right lower extremity was isolated from the perineum with plastic drapes  and prepped and draped in the usual sterile fashion.  Short  posterolateral incision is made with a 10 blade through subcutaneous  tissue to the level of the fascia lata which was incised in line with  the skin incision.  The sciatic nerve was palpated and protected and the  short rotators isolated off of the femur.  Capsulectomy is performed and  the hip is dislocated.  The center of the femoral head is marked and a  trial prosthesis placed such that the center of the trial head  corresponds to the center of native femoral head.  Osteotomy lines  marked on the femoral neck.  Osteotomy made with an oscillating saw.  Femoral head was removed and then the femur retracted  anteriorly to gain  acetabular exposure.  Acetabular retractors were placed and labrum and  osteophytes are removed.  Reaming begins at 43 mm and we reamed up to 49  mm and then a 50 mm Pinnacle acetabular shell was placed in anatomic  position and transfixed with 2 dome screws.  Trial of 32 mm neutral and  +4 liner was placed.   The femur was then prepared with the canal finder and irrigation.  Axial  reaming was performed up to 13.5 mm, proximal reaming to an 18D and the  sleeve machined to a large.  The 18D large trial sleeve was placed with  an 18 x 13 stem and a 36 plus 8 neck matching native anteversion.  The  trial 32.0 head was placed and the hips reduced with outstanding  stability.  She had full extension, full external rotation, 70 degrees  of flexion, 40 degrees of adduction and 90 degrees of internal rotation  and 90 degrees of flexion and 70 degrees of internal rotation.  By  placing her right leg on top of the left it felt as though the leg  lengths were now equal.  The hips were then dislocated and all trials  were removed.  The permanent apex hole eliminator was placed into the  acetabular shell and the permanent 32 mm neutral +4 Marathon liner was  placed.  On the femoral side the 18D large sleeve was placed and 18 x 13  stem and 36 plus 8 neck matching her native anteversion.  The 32.0 head  is placed and the hips reduced with the same stability parameters.  The  wounds copiously irrigated with saline solution and the short rotators  reattached to the femur through drill holes.  Fascia lata was closed  over Hemovac drain with interrupted #1 Vicryl.  Subcu closed with 1-0  and 2-0 Vicryl and subcuticular with running 4-0 Monocryl.  Drains were  hooked to suction.  Incision cleaned and dried and Steri-Strips and a  bulky sterile dressing applied.  She was then placed into a knee  immobilizer, awakened and transferred to recovery in stable condition.      Ollen Gross, M.D.  Electronically Signed     FA/MEDQ  D:  04/12/2007  T:  04/13/2007  Job:  [id number]

## 2010-09-13 NOTE — H&P (Signed)
Kayla, Wells NO.:  [id number]   MEDICAL RECORD NO.:  [id number]         PATIENT TYPE:  LINP   LOCATION:                               FACILITY:  [facility]   PHYSICIAN:  Ollen Gross, M.D.    DATE OF BIRTH:  July 29, 1929   DATE OF ADMISSION:  04/12/2007  DATE OF DISCHARGE:                              HISTORY & PHYSICAL   DATE OF OFFICE VISIT HISTORY AND PHYSICAL:  April 04, 2007.   CHIEF COMPLAINT:  Right hip pain.  The patient is a 77 year old female  who has seen by Dr. Lequita Halt for ongoing right hip pain.  It has been  ongoing for quite some time now.  She has known significant  osteonecrosis in the right hip with collapse.  X-rays show significant  collapse of the right femoral head with bone-on-bone changes.  It is  felt that she would benefit undergoing surgical intervention.  Risks and  benefits have been discussed, and she would like to proceed with  surgery.  She has been seen preoperatively by Dr. Tera Mater. [place] and  felt to be stable, and medically cleared for upcoming surgery.   ALLERGIES:  No known drug allergies.   CURRENT MEDICATIONS:  Synthroid, Celebrex, Crestor, Benicar, tramadol,  gabapentin, baby aspirin, Afrin nasal spray, Viactiv chews, Centrum  Silver, Caltrate plus D, multivitamin, Flonase, Singulair, alprazolam.   PAST MEDICAL HISTORY:  1. Fibromyalgia.  2. Anxiety.  3. Hypertension.  4. Reflux.  5. Diverticulosis.  6. Hemorrhoids.  7. Urinary incontinence.  8. Hypothyroidism.  9. Osteoarthritis.  10.Elevated cholesterol   PAST SURGICAL HISTORY:  1. Hysterectomy.  2. Foot surgery.  3. Knee scope.  4. Esophageal dilatation.  5. Colonoscopy.   SOCIAL HISTORY:  Married, nonsmoker, no alcohol.  Husband will be  assisting with care after surgery.   FAMILY HISTORY:  Father deceased age 94 with heart disease.  Mother with  arthritis.   REVIEW OF SYSTEMS:  GENERAL:  No fevers, chills or night sweats.  NEURO:  No  seizures or paralysis.  RESPIRATORY:  No shortness breath, productive  cough, or hemoptysis.  CARDIOVASCULAR: No chest pain, angina, or  orthopnea.  GI:  No nausea, vomiting, no diarrhea, some constipation.  GU:  A little bit of frequency, nocturia.  No dysuria or hematuria.  MUSCULOSKELETAL:  Right hip.   PHYSICAL EXAMINATION:  VITAL SIGNS:  Pulse 72, respirations 14, blood  pressure 109/58.  GENERAL: A 77 year old white female well-nourished, well-developed, in  no acute distress.  She is alert and cooperative, pleasant, accompanied  by her husband.  HEENT:  Normocephalic, atraumatic.  Pupils are round and reactive.  Oropharynx clear.  EOMs intact.  NECK:  Supple.  CHEST:  Clear anterior posterior chest walls.  HEART:  Regular rate and rhythm without murmur, S1-S2 noted.  ABDOMEN:  Soft, round.  Bowel sounds present.  RECTAL, BREASTS, AND GENITALIA:  Not done.  Not pertinent to present  illness.  EXTREMITIES:  Right hip flexion 90, zero internal rotation, 0 degrees of  external rotation, and 10 degrees of abduction.   IMPRESSION:  1.  Osteoarthritis right hip.  2. Anxiety.  3. Fibromyalgia.  4. Hypercholesterolemia.  5. Hypertension.  6. Reflux.  7. Diverticulosis.  8. Hemorrhoids.  9. Urinary incontinence.  10.Hypothyroidism.   PLAN:  The patient will be [facility] to undergo a right total  replacement arthroplasty.  Surgery will be performed by Dr. Ollen Gross.      Alexzandrew L. Perkins, P.A.C.      Ollen Gross, M.D.  Electronically Signed    ALP/MEDQ  D:  04/14/2007  T:  04/15/2007  Job:  [id number]   cc:   Anselmo Rod, M.D.  Fax: [phone]   Tera Mater. Evlyn Kanner, M.D.  Fax: [phone]

## 2010-09-13 NOTE — Op Note (Signed)
[name], [name] NO.:  [id number]   MEDICAL RECORD NO.:  [id number]          PATIENT TYPE:  EMS   LOCATION:  ED                           FACILITY:  [facility]   PHYSICIAN:  Madlyn Frankel. Charlann Boxer, M.D.  DATE OF BIRTH:  December 18, 1929   DATE OF PROCEDURE:  09/08/2008  DATE OF DISCHARGE:                               OPERATIVE REPORT   PREOPERATIVE DIAGNOSIS:  Dislocated right total hip replacement.   POSTOPERATIVE DIAGNOSIS:  Dislocated right total hip replacement.   PROCEDURE:  Closed reduction, right total hip replacement under  conscious sedation in the emergency room.  Anesthesia was provided  through the ER physician with conscious sedation, Dione Booze, MD.   COMPLICATIONS:  None.  Postprocedure she was stable, placed in a knee  immobilizer.   INDICATIONS FOR PROCEDURE:  Ms. Kayla Wells is a 77 year old female with  history of index right total hip replacement in December 2008  complicated by dislocation in September 2009, closed reduced  successfully.  She was putting clothes away today sitting on a chair,  when she felt her hip pop out.  She is brought to the emergency room and  radiographs confirmed dislocation of the right total hip.  I reviewed  with her the current situation and she has been through this before.  Consent was obtained for closed reduction.   PROCEDURE IN DETAIL:  The patient was brought to the operative theater.  Once adequate anesthesia was established through the ER, the hip was  flexed internally rotated and axial traction applied.  There was a  palpable reduction of the hip with restoration of normal leg length.  Once she was awoken from anesthesia, she was placed into a knee  immobilizer and postoperative radiographs obtained prior to discharge.  She will be weightbearing as tolerated and will be instructed to follow-  up with Dr. Lequita Halt in a 2-week period of time for evaluation.      Madlyn Frankel Charlann Boxer, M.D.  Electronically  Signed     MDO/MEDQ  D:  09/08/2008  T:  09/09/2008  Job:  [id number]

## 2010-09-13 NOTE — Op Note (Signed)
NAMEHARRIS, Kayla Wells                ACCOUNT NO.:  [id number]   MEDICAL RECORD NO.:  [id number]          PATIENT TYPE:  EMS   LOCATION:  ED                           FACILITY:  [facility]   PHYSICIAN:  Almedia Balls. Ranell Patrick, M.D. DATE OF BIRTH:  [birth year]   DATE OF PROCEDURE:  01/10/2008  DATE OF DISCHARGE:                               OPERATIVE REPORT   PREOPERATIVE DIAGNOSIS:  Right total hip dislocation.   POSTOPERATIVE DIAGNOSIS:  Right total hip dislocation.   PROCEDURE:  Right total hip closed reduction.   SURGEON:  Almedia Balls. Ranell Patrick, M.D.   ASSISTANT:  None.   ANESTHESIA:  General anesthesia was used.   ESTIMATED BLOOD LOSS:  None.   FLUID REPLACEMENT:  200 mL crystalloid.   INSTRUMENT COUNT:  Correct.   COMPLICATIONS:  None.   INDICATIONS:  The patient is a 77 year old female status post total hip  replacement by Dr. Ollen Gross back in December 2008.  The patient  bent over today and suffered a closed dislocation of her right hip.  She  presents now with x-rays demonstrating a posterior dislocation of her  hip.  The patient is now brought to the operating room theater for  definitive closed reduction.   DESCRIPTION OF PROCEDURE:  After an adequate level of anesthesia  achieved using a flexion adduction internal rotation maneuver we were  able to successfully close reduce the hip.  Radiographs demonstrating  concentric reduction with no fracture.  Her hip was nice and smooth once  it was reduced and some passive range of motion applied to the hip.  We  then placed the patient in a knee immobilizer and she was brought to the  recovery room.      Almedia Balls. Ranell Patrick, M.D.  Electronically Signed     SRN/MEDQ  D:  01/10/2008  T:  01/12/2008  Job:  [id number]

## 2010-09-16 NOTE — Op Note (Signed)
Kayla Wells                ACCOUNT NO.:  [id number]   MEDICAL RECORD NO.:  [id number]          PATIENT TYPE:  AMB   LOCATION:  DSC                          FACILITY:  [facility]   PHYSICIAN:  Leonides Grills, M.D.     DATE OF BIRTH:  Nov 29, 1929   DATE OF PROCEDURE:  07/12/2004  DATE OF DISCHARGE:                                 OPERATIVE REPORT   PREOPERATIVE DIAGNOSES:  1.  Right peroneus longus and peroneus brevis tendon tears.  2.  Right ankle impingement.  3.  Right subluxing peroneal tendons.   POSTOPERATIVE DIAGNOSES:  1.  Right peroneus longus and peroneus brevis tendon tears.  2.  Right ankle impingement.  3.  Right subluxing peroneal tendons.   OPERATIONS:  1.  Right ankle arthroscopy with extensive debridement.  2.  Right repair of subluxing peroneal tendons without fibular osteotomy.  3.  Right repair peroneus longus tendon tear.  4.  Right peroneus longus to peroneus brevis tendon transfer.   ANESTHESIA:  General with popliteal block.   SURGEON:  Leonides Grills, M.D.   ASSISTANT:  Lianne Cure, P.A.C.   ESTIMATED BLOOD LOSS:  Minimal.   TOURNIQUET TIME:  Approximately an hour.   COMPLICATIONS:  None.   DISPOSITION:  Stable to PR.   INDICATIONS FOR PROCEDURE:  This is a 77 year old female who has had  longstanding anterior and lateral ankle pain that was interfering with her  life.  She was found on MRI to have a tear of the peroneus longus as well as  complete tear of the peroneus brevis.  She was consented for the above  procedure.  All risks which include infection, neurovascular injury,  persistent pain, worsening pain, prolonged recovery, stiffness, arthritis  were all explained.  Questions were encouraged and answered.   DESCRIPTION OF PROCEDURE:  The patient was brought to the operating room,  placed in supine position.  After adequate general endotracheal anesthesia  was administered as well as Ancef 1 g IV piggyback.  The right lower  extremity  was then prepped and draped in sterile manner over a proximally  placed thigh tourniquet.  Limb was gravity exsanguinated.  Tourniquet was  elevated to 290 mmHg.  Curvilinear incision was made over the lateral aspect  of the ankle.  Dissection was carried out through skin and hemostasis was  obtained.  Peroneal retinaculum was then incised approximately 1 to 2 mm  just posterior to the edge of the lateral malleolus.  Once this was incised,  both the peroneus brevis and longus tendons were identified.  Peroneus  brevis was completely torn and had a chronic nature to it.  The peroneus  longus tendon was torn in several places as well.  We tried to find the  distal aspect of the peroneus brevis stump but it was too distal and would  not reach.  We could not do a primary repair.  Because of this, we decided  to repair the peroneus longus tendon and transfer the peroneus longus to the  peroneus brevis as well as repair of the subluxing peroneal tendons.  We  repaired the peroneus longus tendon with #2 FiberWire as well as a 2-0  FiberWire.  Once this was repaired, we then transferred the peroneus longus  to peroneus brevis using #2 FiberWire.  Once this transfer was completed and  this had excellent repair, we then copiously irrigated the area with normal  saline.  We then created a trough on the posterolateral corner of the  lateral malleolus using a curved quarter inch osteotome with the rongeur.  We then placed 2.5 mm drill holes in not only this trough but also the on  the lateral aspect of the lateral malleolus.  We then advanced the peroneal  retinaculum to the trough and repaired this with a #2 FiberWire.  This had  an Conservation officer, historic buildings.  The remaining portion of the retinaculum was  repaired with 2-0 FiberWire stitch protecting the peroneal tendons with the  Freer.  Once this was completely repaired, the area was copiously irrigated  with normal saline.  Tourniquet was deflated.   Hemostasis was obtained.  Subcu was closed with 3-0 Vicryl.  Skin was closed with 4-0 nylon.  We then  anatomically mapped out the anterior tibialis tendon and peroneus tertius  and superficial peroneal nerve.  We then placed the __________ just medial  to the anterior tibialis tendon and 20 mL of normal saline was instilled in  the ankle.  Weston Brass and spread technique was then utilized to create the  anterior medial portal just medial to the anterior tibialis tendon.  Blunt  tipped trocar with cannula followed by a camera was then placed in the ankle  and direct visualization, the anterior lateral portal was then created with  spinal needle followed by the nick and spread technique.  There was a large  amount of synovitis throughout the entire aspect of the ankle but mostly  anterolaterally.  The entire ankle was extensively debrided with bevel as  well as a shaver.  This took some time.  Hemostasis was obtained throughout.  There was no osteochondral lesions. The gutters both medially and laterally  were debrided as well.  This had an Conservation officer, historic buildings.  Cameras removed.  Wounds were closed with 4-0 nylon sutures.  Sterile dressing was applied.  Jones dressing was applied.  Patient was stable to PR.      PB/MEDQ  D:  07/12/2004  T:  07/12/2004  Job:  [id number]

## 2010-09-16 NOTE — Discharge Summary (Signed)
Kayla Wells, Kayla Wells                ACCOUNT NO.:  [id number]   MEDICAL RECORD NO.:  [id number]          PATIENT TYPE:  INP   LOCATION:  1621                         [facility]ACILITY:  [facility]   PHYSICIAN:  Ollen Gross, M.D.    DATE OF BIRTH:  [birth year]   DATE OF ADMISSION:  04/12/2007  DATE OF DISCHARGE:  04/15/2007                               DISCHARGE SUMMARY   ADMISSION DIAGNOSES:  1. Osteoarthritis, right hip.  2. Anxiety.  3. Fibromyalgia.  4. Hypercholesterolemia.  5. Hypertension.  6. Reflux disease.  7. Diverticulosis.  8. Hemorrhoid resection with urinary incontinence.  9. Hypothyroidism.   DISCHARGE DIAGNOSES:  1. Avascular necrosis right hip status post right total hip      arthroplasty.  2. Mild postop blood loss anemia.  3. Mild postop hypokalemia.  4. Osteoarthritis, right hip.  5. Anxiety.  6. Fibromyalgia.  7. Hypercholesterolemia.  8. Hypertension.  9. Reflux disease.  10.Diverticulosis.  11.Hemorrhoid resection with urinary incontinence.  12.Hypothyroidism.   PROCEDURE:  April 12, 2007, right total hip.  Surgeon Dr. Lequita Halt.  Assistant, Kayla Wells.  Spinal anesthesia.   CONSULTS:  None.   BRIEF HISTORY:  Kayla Wells is a 77 year old female with osteonecrosis of  the right hip with flattening of femoral head, severe pain, degenerative  changes, now presents for total arthroplasty.   LABORATORY DATA:  Preop CBC showed hemoglobin 13.4, hematocrit of 40.0,  white cell count 6.7, postop hemoglobin 10.4 drifted down to 9.4.  Last  known H&H 9.0 and 26.3.  PT/PTT preop 12.9 and 29 respectively.  INR  1.0.  Serial protimes follows:  PT/INR 18.8 and 1.5.  Chem panel on  admission:  Elevated BUN of 24.  Remaining Chem panel within normal  limits with the exception of low level of ALP 33.  Serial B-mets were  followed.  BUN came down to normal level 11.  Remaining electrolytes  remained within normal limits with a section of potassium dropped 4.6-  4.1,  last noted a mild low level of 3.3.  Preop UA:  Positive nitrite,  large leukocyte esterase, rare epithelials, 21-50 white cells, 7-10 red  cells, treated preoperatively with many bacteria.  Blood group type O  negative.  Chest x-ray April 08, 2007:  No acute cardiopulmonary  findings, mild chronic bronchitic changes.  Hip films:  Significant  progression of degenerative disease involving the right hip since prior  films of 2007, cannot exclude a focus of avascular necrosis.  Portable  hip films:  Satisfactory appearance of right total hip.   HOSPITAL COURSE:  The patient admitted to [facility],  tolerated procedure well, later transferred to the recovery room on  orthopedic floor.  Started on PCA and p.o. analgesics.  Doing fairly  well on morning of day #1.  Did have some pain.  Deep PCA was weaned and  she was encouraged to take p.o. medications.  Started getting up out of  bed on day #1.  By day #2, dressing was changed and incision looked  good.  No signs of infection, and she did extremely well.  On day #2,  ambulating about 140 feet that morning.  Hemoglobin was stable,  continued to progress well, and by day #3, tolerating her medications,  walking greater 120 feet and was discharged home.   DISCHARGE PLAN:  1. Discharged home on April 15, 2007.  2. Discharge diagnoses, please see above.  3. Discharge medications:  Percocet, Robaxin, Coumadin.  4. Diet: Resume home diet.  5. Activity: Partial weightbearing 25-50% right lower extremity.  6. Hip precautions.  Total hip protocol.  7. Home health PT, home health nursing.  8. A follow-up appointment on April 30, 2007, call for appointment      time.   DISPOSITION:  Home.   CONDITION ON DISCHARGE:  Improved.      Alexzandrew L. Perkins, P.A.C.      Ollen Gross, M.D.  Electronically Signed    ALP/MEDQ  D:  04/30/2007  T:  04/30/2007  Job:  [id number]   cc:   Ollen Gross, M.D.  Fax: [phone]    WGNFAO ZHY QMVH, M.D.  Fax: [phone]   Tera Mater. Evlyn Kanner, M.D.  Fax: [phone]

## 2010-09-16 NOTE — Op Note (Signed)
Kayla Wells, Kayla Wells                ACCOUNT NO.:  [id number]   MEDICAL RECORD NO.:  [id number]          PATIENT TYPE:  AMB   LOCATION:  ENDO                         FACILITY:  [facility]   PHYSICIAN:  Anselmo Rod, M.D.  DATE OF BIRTH:  Aug 16, 1929   DATE OF PROCEDURE:  05/18/2004  DATE OF DISCHARGE:                                 OPERATIVE REPORT   PROCEDURE PERFORMED:  Screening colonoscopy.   ENDOSCOPIST:  Anselmo Rod, M.D.   INSTRUMENT USED:  Olympus video colonoscope.   INDICATION FOR PROCEDURE:  A 77 year old white female with a history of  occasional BRBPR secondary to constipation, undergoing screening colonoscopy  to rule out colonic polyps, masses, etc.  The patient has had some left  lower quadrant discomfort as well.   PREPROCEDURE PREPARATION:  Informed consent was procured from the patient.  The patient was fasted for eight hours prior to the procedure and prepped  with a bottle of magnesium citrate and a gallon of GoLYTELY the night prior  to the procedure.   PREPROCEDURE PHYSICAL:  VITAL SIGNS:  The patient had stable vital signs.  NECK:  Supple.  CHEST:  Clear to auscultation.  S1, S2 regular.  ABDOMEN:  Soft with normal bowel sounds.   DESCRIPTION OF PROCEDURE:  The patient was placed in the left lateral  decubitus position and sedated with 50 mg of Demerol and 4 mg of Versed.  Once the patient was adequately sedate and maintained on low-flow oxygen and  continuous cardiac monitoring, the Olympus video colonoscope was advanced  from the rectum to the cecum.  The appendiceal orifice and the ileocecal  valve were clearly visualized and photographed.  The terminal ileum appeared  healthy and without lesions.  There was evidence of sigmoid diverticulosis.  No masses or polyps were seen.  The patient tolerated the procedure well  without complications.  Retroflexion in the rectum revealed no  abnormalities.   IMPRESSION:  1.  Essentially unrevealing  colonoscopy up to the terminal ileum except for      a few sigmoid diverticula.  2.  No masses or polyps seen.   RECOMMENDATIONS:  1.  A high-fiber diet with liberal fluid intake has been advocated.  2.  Repeat colonoscopy in the next 10 years unless the patient develops any      abnormal symptoms in the interim.  3.  Outpatient follow-up as the need arises in the future.      Jyot   JNM/MEDQ  D:  05/18/2004  T:  05/18/2004  Job:  [id number]   cc:   Edwena Felty. Romine, M.D.  [address]  [place]  [place] [address]  Fax: [phone]   Tera Mater. Evlyn Kanner, M.D.  [address]  [place]  [place] [address]  Fax: [phone]

## 2010-09-30 ENCOUNTER — Ambulatory Visit (INDEPENDENT_AMBULATORY_CARE_PROVIDER_SITE_OTHER): Payer: Medicare Other | Admitting: Internal Medicine

## 2010-09-30 ENCOUNTER — Encounter: Payer: Self-pay | Admitting: Internal Medicine

## 2010-09-30 VITALS — BP 116/76 | HR 58 | Temp 98.0°F | Ht 62.0 in | Wt 170.0 lb

## 2010-09-30 DIAGNOSIS — R05 Cough: Secondary | ICD-10-CM

## 2010-09-30 DIAGNOSIS — J8410 Pulmonary fibrosis, unspecified: Secondary | ICD-10-CM

## 2010-09-30 LAB — PULMONARY FUNCTION TEST

## 2010-09-30 MED ORDER — FAMOTIDINE 20 MG PO TABS: ORAL_TABLET | ORAL | Status: DC

## 2010-09-30 NOTE — Progress Notes (Signed)
Subjective:    Patient ID: Kayla Wells, female    DOB: Jul 19, 1929, 77 y.o.   MRN: [id number]  HPI    21  yowf never smoker with h/o rhinitis since 1957 seasonal shots helped afrin dep and has carried dx of asthma for rx with singulair which may not have helped the breathing but may have helped her nasal symptoms.   April 14, 2009 cc chest discomfort developed overnight early October(occured one week exposure to sick people traveling from [place] on a bus/air trip) worse with breathing assoc with dry cough increase sob got worse over a week and went to Dr Evlyn Kanner dx as pneumonia rx with 2 rounds of abx seemed some better but still has discomfort with deep breath and worse sob with activity. dx ? ILD ? from [facility] with upper airway coughing rx as uacs with gerd rx but not adherent.   May 14, 2009 Followup. Pt states that she woke up very SOB and gasping for air 2 nights prior to ov. She states that she has flet very "sluggish"- relates this to cough meds. She still c/o cough - prod with clear sputum. rec start prednisone 20 until better taper to 10.   May 31, 2009 some better on 20 of prednisone tapered off (instructions said to hold at one). worse w/in a week off.   July 28, 2009 6 wk followup. Pt states that her cough is about the same- now she is coughing up clear sputum- realtes this change to the rainy weather and allergies. She states that her breathing had been okay but worse now since rainy weather. did worse off singulair in terms of "allergies and sinus" so restarted. rec taper prednisone to 10 mg one half daily when better   October 25, 2009 tapered completely off prednisone June 2 no flare of cough or sob. No desats walking so f/u prn  08/29/2010 ov / [name] fine until 3 weeks ago with pain in chest typical of hb then cough got real bad and throat closed p eating fish, no excess sputum.  Sleeping ok - desated on walking in office REC maintain prilosec 20mg  30-60 min before eating  first and in the event of flare up I want you to increase the prilosec to 20 mg Take 30- 60 min before your first and last meals of the day and Pepcid 20 mg one at bedtime ( reflux is to respiratory conditions what oxygen is to fire)  If you start coughing a lot in addition the acid treat ment I also recommend you use  delsym two tsp every 12 hours and supplement if needed with  tramadol 50 mg up to 2 every 4 hours to suppress the urge to cough.  Once you have eliminated the cough for 3 straight days try reducing the tramadol first,  then the delsym as tolerated.     09/30/2010 ov/ [name]  Cc doe x steps, none walking at a moderate pace flat, no longer coughing. Pt denies any significant sore throat, dysphagia, itching, sneezing,  nasal congestion or excess/ purulent secretions,  fever, chills, sweats, unintended wt loss, pleuritic or exertional cp, hempoptysis, orthopnea pnd or leg swelling.    Also denies any obvious fluctuation of symptoms with weather or environmental changes or other aggravating or alleviating factors.  Sleeping ok without nocturnal  or early am exac of resp c/o's or need for noct saba. No longer needing tramadol or cough suppressants at all    Past Medical History:  Hyperlipidemia  Hypertension  GERD  - s/p stretching 2010 Dr [address]  Pulmonary Fibrosis........................................................Marland KitchenWert  - Hx of macrodantin exposure stopped ? 01/2009  - ESR 49 04/14/2009 > 67 May 05, 2009 > 32 Sep 09, 2009  - Daily prednisone rx May 15, 2009 > some better then worse after stopped 06/18/09  - Restart daily prednisone June 26, 2009 >> tapered off 09/30/09 > no relapse of symptoms  - Desat walking 08/29/2010  p 2 laps but not change on cxr - Walked 2 laps @ 185 ft each stopped due to  End of study, no desat -- PFT's 09/30/2010 FEV1 1.73 (108%) ratio 83 and DLCO 59% corrects to 134      Review of Systems     Objective:   Physical Exam amb wf who continues  to  fail to answer a single question asked in a straightforward manner.  wt 171 April 14, 2009 > > 175 July 29, 2009 > 175 Sep 09, 2009 > 173 October 25, 2009 > 08/29/2010 172  170 09/30/2010  HEENT: nl dentition, turbinates, and orophanx. Nl external ear canals without cough reflex  Neck without JVD/Nodes/TM  Lungs minimal bilateral insp crackles R > L with no cough now on insp  RRR no s3 or murmur or increase in P2  Abd soft and benign with nl excursion in the supine position. No bruits or organomegaly  Ext warm without calf tenderness, cyanosis clubbing or edema  Skin warm and dry without lesions       Assessment & Plan:

## 2010-09-30 NOTE — Patient Instructions (Signed)
No need for further follow up unless you notice the cough won't go away or loosing ground with activity tolerance due to short of breath.   If you are satisfied with your treatment plan let your doctor know and he/she can either refill your medications or you can return here when your prescription runs out.     If in any way you are not 100% satisfied,  please tell us.  If 100% better, tell your friends!

## 2010-09-30 NOTE — Progress Notes (Signed)
PFT done today. 

## 2010-10-01 NOTE — Assessment & Plan Note (Signed)
Clearly better with gerd rx > continue

## 2010-10-01 NOTE — Assessment & Plan Note (Addendum)
Clearly improved simply by suppressing GERD and no longer desats with good pft's so although she clearly has PF ? From macrodantin exp previously, it is not progressing .  Use of PPI is associated with improved survival time and with decreased radiologic fibrosis per King's study published in AJRCCM vol 184 p1390.  Dec 2011  This may not be cause and effect, but given how universally unhelpful all the other study drugs have been for pf,   rec continue  rx ppi / diet/ lifestyle modification.    Most of her symptoms are now attributable to deconditioning > discussed recondtioning in detail  Pulmonary f/u at this point can be prn  See instructions for specific recommendations which were reviewed directly with the patient who was given a copy with highlighter outlining the key components.

## 2010-10-14 ENCOUNTER — Encounter: Payer: Self-pay | Admitting: Internal Medicine

## 2011-02-01 LAB — CBC
Hemoglobin: 12.6
RBC: 4.34
WBC: 7.9

## 2011-02-01 LAB — COMPREHENSIVE METABOLIC PANEL
ALT: 19
AST: 23
Alkaline Phosphatase: 33 — ABNORMAL LOW
Calcium: 9
GFR calc Af Amer: >60
Glucose, Bld: 110 — ABNORMAL HIGH
Potassium: 3.6
Sodium: 138
Total Protein: 6.1

## 2011-02-01 LAB — DIFFERENTIAL
Basophils Relative: 0
Eosinophils Absolute: 0.2
Eosinophils Relative: 2
Lymphs Abs: 1.4
Monocytes Absolute: 0.5
Monocytes Relative: 6
Neutrophils Relative %: 73

## 2011-02-03 LAB — PROTIME-INR
INR: 1.5
Prothrombin Time: 18.8 — ABNORMAL HIGH

## 2011-02-03 LAB — CBC
MCHC: 34.4
MCV: 87.5
Platelets: 199
RDW: 13.5

## 2011-02-06 LAB — CBC
HCT: 27.3 — ABNORMAL LOW
HCT: 29.7 — ABNORMAL LOW
Hemoglobin: 10.4 — ABNORMAL LOW
Hemoglobin: 13.4
Hemoglobin: 9.4 — ABNORMAL LOW
MCHC: 33.5
MCV: 86.8
MCV: 87
MCV: 87
RBC: 3.43 — ABNORMAL LOW
RBC: 4.6
RDW: 13.3
WBC: 6.4
WBC: 6.7

## 2011-02-06 LAB — URINALYSIS, ROUTINE W REFLEX MICROSCOPIC
Bilirubin Urine: NEGATIVE
Ketones, ur: NEGATIVE
Nitrite: POSITIVE — AB
Specific Gravity, Urine: 1.022
Urobilinogen, UA: 0.2

## 2011-02-06 LAB — BASIC METABOLIC PANEL
CO2: 31
CO2: 31
Chloride: 102
Chloride: 102
GFR calc Af Amer: >60
GFR calc non Af Amer: >60
Glucose, Bld: 114 — ABNORMAL HIGH
Potassium: 3.3 — ABNORMAL LOW
Potassium: 4.1
Sodium: 137

## 2011-02-06 LAB — COMPREHENSIVE METABOLIC PANEL
ALT: 21
AST: 25
CO2: 31
Chloride: 103
Creatinine, Ser: 0.75
GFR calc Af Amer: >60
GFR calc non Af Amer: >60
Glucose, Bld: 96
Sodium: 141
Total Bilirubin: 0.8

## 2011-02-06 LAB — PROTIME-INR: Prothrombin Time: 12.9

## 2011-02-06 LAB — ABO/RH: ABO/RH(D): O NEG

## 2011-02-06 LAB — URINE MICROSCOPIC-ADD ON

## 2012-04-16 ENCOUNTER — Emergency Department (HOSPITAL_COMMUNITY): Payer: Medicare Other

## 2012-04-16 ENCOUNTER — Emergency Department (HOSPITAL_COMMUNITY)
Admission: EM | Admit: 2012-04-16 | Discharge: 2012-04-16 | Disposition: A | Discharge status: Home / Self Care | Payer: Medicare Other | Attending: Emergency Medicine | Admitting: Emergency Medicine

## 2012-04-16 ENCOUNTER — Encounter (HOSPITAL_COMMUNITY): Payer: Self-pay | Admitting: Radiology

## 2012-04-16 DIAGNOSIS — E785 Hyperlipidemia, unspecified: Secondary | ICD-10-CM | POA: Insufficient documentation

## 2012-04-16 DIAGNOSIS — I10 Essential (primary) hypertension: Secondary | ICD-10-CM | POA: Insufficient documentation

## 2012-04-16 DIAGNOSIS — S43006A Unspecified dislocation of unspecified shoulder joint, initial encounter: Secondary | ICD-10-CM | POA: Insufficient documentation

## 2012-04-16 DIAGNOSIS — W010XXA Fall on same level from slipping, tripping and stumbling without subsequent striking against object, initial encounter: Secondary | ICD-10-CM | POA: Insufficient documentation

## 2012-04-16 DIAGNOSIS — Z79899 Other long term (current) drug therapy: Secondary | ICD-10-CM | POA: Insufficient documentation

## 2012-04-16 DIAGNOSIS — S43015A Anterior dislocation of left humerus, initial encounter: Secondary | ICD-10-CM

## 2012-04-16 DIAGNOSIS — Y929 Unspecified place or not applicable: Secondary | ICD-10-CM | POA: Insufficient documentation

## 2012-04-16 DIAGNOSIS — Z8719 Personal history of other diseases of the digestive system: Secondary | ICD-10-CM | POA: Insufficient documentation

## 2012-04-16 DIAGNOSIS — Z8709 Personal history of other diseases of the respiratory system: Secondary | ICD-10-CM | POA: Insufficient documentation

## 2012-04-16 DIAGNOSIS — Y939 Activity, unspecified: Secondary | ICD-10-CM | POA: Insufficient documentation

## 2012-04-16 MED ORDER — OXYCODONE-ACETAMINOPHEN 5-325 MG PO TABS: 2.0000 | ORAL_TABLET | ORAL | Status: DC | PRN

## 2012-04-16 MED ORDER — FENTANYL CITRATE 0.05 MG/ML IJ SOLN
50.0000 ug | Freq: Once | INTRAMUSCULAR | Status: AC
  Administered 2012-04-16: 50 ug via INTRAVENOUS
  Filled 2012-04-16: qty 2

## 2012-04-16 MED ORDER — MIDAZOLAM HCL 2 MG/2ML IJ SOLN
4.0000 mg | Freq: Once | INTRAMUSCULAR | Status: DC
  Filled 2012-04-16: qty 4

## 2012-04-16 MED ORDER — FENTANYL CITRATE 0.05 MG/ML IJ SOLN
100.0000 ug | Freq: Once | INTRAMUSCULAR | Status: AC
  Administered 2012-04-16: 100 ug via INTRAVENOUS
  Filled 2012-04-16: qty 2

## 2012-04-16 MED ORDER — MORPHINE SULFATE 2 MG/ML IJ SOLN
INTRAMUSCULAR | Status: AC
  Filled 2012-04-16: qty 2

## 2012-04-16 MED ORDER — ONDANSETRON HCL 4 MG/2ML IJ SOLN
INTRAMUSCULAR | Status: AC
  Filled 2012-04-16: qty 2

## 2012-04-16 MED ORDER — FENTANYL CITRATE 0.05 MG/ML IJ SOLN
50.0000 ug | Freq: Once | INTRAMUSCULAR | Status: DC
  Filled 2012-04-16: qty 2

## 2012-04-16 MED ORDER — MORPHINE SULFATE 4 MG/ML IJ SOLN
4.0000 mg | Freq: Once | INTRAMUSCULAR | Status: AC
  Administered 2012-04-16: 4 mg via INTRAVENOUS
  Filled 2012-04-16: qty 1

## 2012-04-16 NOTE — Progress Notes (Signed)
Orthopedic Tech Progress Note Patient Details:  Kayla Wells 11-24-1929 [id number]  Ortho Devices Type of Ortho Device: Arm sling Ortho Device/Splint Location: left arm Ortho Device/Splint Interventions: Application   Nikki Dom 04/16/2012, 7:18 PM

## 2012-04-16 NOTE — ED Provider Notes (Signed)
History     CSN: [id number]  Arrival date & time 04/16/12  1300   First MD Initiated Contact with Patient 04/16/12 1337      Chief Complaint  Patient presents with  . Fall    (Consider location/radiation/quality/duration/timing/severity/associated sxs/prior treatment) HPI Pt with a witnessed trip and fall landing on L shoulder. No head or neck injury. No LOC. States she is having some paraesthesias in L hand. No focal weakness. No Cp, SOb, abd pain, N/V.   Past Medical History  Diagnosis Date  . Other and unspecified hyperlipidemia   . Unspecified essential hypertension   . GERD (gastroesophageal reflux disease)   . Pulmonary fibrosis     Past Surgical History  Procedure Date  . Nasal sinus surgery   . Vesicovaginal fistula closure w/ tah 1978  . Foot susrgery   . Revision total knee arthroplasty   . Total hip arthroplasty     right    Family History  Problem Relation Age of Onset  . Heart disease Father   . Atopy Neg Hx     History  Substance Use Topics  . Smoking status: Never Smoker   . Smokeless tobacco: Never Used  . Alcohol Use: No    OB History    Grav Para Term Preterm Abortions TAB SAB Ect Mult Living                  Review of Systems  Constitutional: Negative for fever and chills.  HENT: Negative for facial swelling, neck pain and neck stiffness.   Eyes: Negative for visual disturbance.  Respiratory: Negative for cough, shortness of breath and wheezing.   Cardiovascular: Negative for chest pain and palpitations.  Gastrointestinal: Negative for nausea, vomiting and abdominal pain.  Genitourinary: Negative for dysuria.  Musculoskeletal: Positive for arthralgias. Negative for myalgias and back pain.  Skin: Negative for rash and wound.  Neurological: Positive for numbness. Negative for dizziness, syncope, weakness, light-headedness and headaches.    Allergies  Macrodantin  Home Medications   Current Outpatient Rx  Name  Route  Sig   Dispense  Refill  . ACETAMINOPHEN 500 MG PO TABS   Oral   Take 500 mg by mouth every 6 (six) hours as needed.          . ALPRAZOLAM 0.25 MG PO TABS   Oral   Take 0.25 mg by mouth daily as needed. For anxiety         . FLUTICASONE PROPIONATE 50 MCG/ACT NA SUSP   Nasal   2 sprays by Nasal route daily.           Marland Kitchen LEVOTHYROXINE SODIUM 100 MCG PO TABS   Oral   Take 100 mcg by mouth daily.           . MELOXICAM 15 MG PO TABS   Oral   Take 15 mg by mouth daily.           Marland Kitchen OLMESARTAN MEDOXOMIL-HCTZ 40-25 MG PO TABS      1/2 once daily          . OXYMETAZOLINE HCL 0.05 % NA SOLN   Nasal   2 sprays by Nasal route daily as needed.           Marland Kitchen ROSUVASTATIN CALCIUM 10 MG PO TABS   Oral   Take 10 mg by mouth daily.           Marland Kitchen FAMOTIDINE 20 MG PO TABS      One  at bedtime         . OXYCODONE-ACETAMINOPHEN 5-325 MG PO TABS   Oral   Take 2 tablets by mouth every 4 (four) hours as needed for pain.   10 tablet   0     BP 132/62  Pulse 77  Temp 97.4 F (36.3 C) (Oral)  Resp 23  SpO2 96%  Physical Exam  Nursing note and vitals reviewed. Constitutional: She is oriented to person, place, and time. She appears well-developed and well-nourished. No distress.  HENT:  Head: Normocephalic and atraumatic.  Mouth/Throat: Oropharynx is clear and moist.  Eyes: EOM are normal. Pupils are equal, round, and reactive to light.  Neck: Normal range of motion. Neck supple.       No posterior midline cervical TTP  Cardiovascular: Normal rate and regular rhythm.   Pulmonary/Chest: Effort normal and breath sounds normal. No respiratory distress. She has no wheezes. She has no rales. She exhibits no tenderness.  Abdominal: Soft. Bowel sounds are normal. She exhibits no distension and no mass. There is no tenderness. There is no rebound and no guarding.  Musculoskeletal: She exhibits tenderness. She exhibits no edema.       Pt holding L arm above head. Pain with ROM of L  shoulder. No obvious deformity. 2+ radial pulses. Normal ROM L elbow/wrist  Neurological: She is alert and oriented to person, place, and time.       Decreased sensation L hand. Good grip strength. 5/5 motor in all ext.   Skin: Skin is warm and dry. No rash noted. No erythema.  Psychiatric: She has a normal mood and affect. Her behavior is normal.    ED Course  Procedures (including critical care time)  Labs Reviewed - No data to display Dg Shoulder 1v Left  04/16/2012  *RADIOLOGY REPORT*  Clinical Data: Fall on left shoulder.  Pain.  Patient unable to lower left arm.  LEFT SHOULDER - 1 VIEW  Comparison: None.  Findings: Abnormal glenohumeral alignment noted with the humeral head projecting over and below the glenoid. The left arm is abducted.  I do not see a definite fracture.  Cervical spondylosis is partially visualized.  IMPRESSION:  1.  Inferior dislocation of the humeral head, with luxatio erecta.   Original Report Authenticated By: Gaylyn Rong, M.D.    Dg Shoulder Left  04/16/2012  *RADIOLOGY REPORT*  Clinical Data: Fall.  Shoulder injury and pain.  LEFT SHOULDER - 2+ VIEW  Comparison: Prior today  Findings: There is persistent anterior dislocation of the humeral head in relation to the glenoid.  No acute fracture identified.  IMPRESSION: Persistent anterior shoulder dislocation.   Original Report Authenticated By: Myles Rosenthal, M.D.    Dg Shoulder Left Port  04/16/2012  *RADIOLOGY REPORT*  Clinical Data: Status post reduction  PORTABLE LEFT SHOULDER - 2+ VIEW  Comparison: 04/16/2012 at 1710 hours  Findings: Interval reduction of the left humeral head on this single frontal view.  No fracture is seen.  Degenerative changes the acromioclavicular joint.  IMPRESSION: Interval reduction of the left humeral head.   Original Report Authenticated By: Charline Bills, M.D.      1. Dislocation of shoulder, anterior, left, closed       MDM          Loren Racer,  MD 04/17/12 [phone]

## 2012-04-16 NOTE — ED Provider Notes (Signed)
I assumed care at sign out from Dr. Ranae Palms. Xray shoulder showed anterior dislocation. I relocated the shoulder, see procedure note below. Repeat xray showed no fracture. Sling placed. She will f/u with her orthopedic doctor.   Shoulder reduction I injected 10 cc of 2% lido into L shoulder joint. I retracted and externally rotated R arm and the shoulder was relocated. Repeat neurovascular exam intact. Repeat xray showed no fracture. No complications.    Richardean Canal, MD 04/16/12 2025

## 2012-04-16 NOTE — ED Notes (Addendum)
Pt presents with left shoulder pain r/t fall. Pt hit head on coffee table denies LOC/ H/E pt not on blood thinner. Pt received of fentayl

## 2012-04-29 ENCOUNTER — Other Ambulatory Visit: Payer: Self-pay | Admitting: Orthopedic Surgery

## 2012-04-29 DIAGNOSIS — M25512 Pain in left shoulder: Secondary | ICD-10-CM

## 2012-04-30 ENCOUNTER — Ambulatory Visit
Admission: RE | Admit: 2012-04-30 | Discharge: 2012-04-30 | Disposition: A | Discharge status: Home / Self Care | Payer: Medicare Other | Source: Ambulatory Visit | Attending: Orthopedic Surgery | Admitting: Orthopedic Surgery

## 2012-04-30 DIAGNOSIS — M25512 Pain in left shoulder: Secondary | ICD-10-CM

## 2012-05-01 HISTORY — PX: OTHER SURGICAL HISTORY: SHX169

## 2013-12-16 ENCOUNTER — Emergency Department (HOSPITAL_COMMUNITY)
Admission: EM | Admit: 2013-12-16 | Discharge: 2013-12-16 | Disposition: A | Discharge status: Home / Self Care | Payer: Medicare Other | Attending: Emergency Medicine | Admitting: Emergency Medicine

## 2013-12-16 ENCOUNTER — Encounter (HOSPITAL_COMMUNITY): Payer: Self-pay | Admitting: Emergency Medicine

## 2013-12-16 DIAGNOSIS — E785 Hyperlipidemia, unspecified: Secondary | ICD-10-CM | POA: Diagnosis not present

## 2013-12-16 DIAGNOSIS — I10 Essential (primary) hypertension: Secondary | ICD-10-CM | POA: Diagnosis not present

## 2013-12-16 DIAGNOSIS — Z8709 Personal history of other diseases of the respiratory system: Secondary | ICD-10-CM | POA: Diagnosis not present

## 2013-12-16 DIAGNOSIS — IMO0002 Reserved for concepts with insufficient information to code with codable children: Secondary | ICD-10-CM | POA: Diagnosis not present

## 2013-12-16 DIAGNOSIS — M129 Arthropathy, unspecified: Secondary | ICD-10-CM | POA: Insufficient documentation

## 2013-12-16 DIAGNOSIS — K219 Gastro-esophageal reflux disease without esophagitis: Secondary | ICD-10-CM | POA: Insufficient documentation

## 2013-12-16 DIAGNOSIS — Z79899 Other long term (current) drug therapy: Secondary | ICD-10-CM | POA: Insufficient documentation

## 2013-12-16 DIAGNOSIS — Z203 Contact with and (suspected) exposure to rabies: Secondary | ICD-10-CM | POA: Insufficient documentation

## 2013-12-16 DIAGNOSIS — Z791 Long term (current) use of non-steroidal anti-inflammatories (NSAID): Secondary | ICD-10-CM | POA: Diagnosis not present

## 2013-12-16 DIAGNOSIS — Z23 Encounter for immunization: Secondary | ICD-10-CM | POA: Insufficient documentation

## 2013-12-16 HISTORY — DX: Unspecified osteoarthritis, unspecified site: M19.90

## 2013-12-16 MED ORDER — RABIES VACCINE, PCEC IM SUSR
1.0000 mL | Freq: Once | INTRAMUSCULAR | Status: AC
  Administered 2013-12-16: 1 mL via INTRAMUSCULAR
  Filled 2013-12-16: qty 1

## 2013-12-16 MED ORDER — RABIES IMMUNE GLOBULIN 150 UNIT/ML IM INJ
20.0000 [IU]/kg | INJECTION | Freq: Once | INTRAMUSCULAR | Status: AC
  Administered 2013-12-16: 1425 [IU] via INTRAMUSCULAR
  Filled 2013-12-16: qty 9.5

## 2013-12-16 NOTE — Discharge Instructions (Signed)
Return in 3 days for your second round of Rabies shots. ° °Rabies  °Rabies is a viral infection that can be spread to people from infected animals. The infection affects the brain and central nervous system. Once the disease develops, it almost always causes death. Because of this, when a person is bitten by an animal that may have rabies, treatment to prevent rabies often needs to be started whether or not the animal is known to be infected. Prompt treatment with the rabies vaccine and rabies immune globulin is very effective at preventing the infection from developing in people who have been exposed to the rabies virus. °CAUSES  °Rabies is caused by a virus that lives inside some animals. When a person is bitten by an infected animal, the rabies virus is spread to the person through the infected spit (saliva) of the animal. This virus can be carried by animals such as dogs, cats, skunks, bats, woodchucks, raccoons, coyotes, and foxes. °SYMPTOMS  °By the time symptoms appear, rabies is usually fatal for the person. Common symptoms include: °· Headache. °· Fever. °· Fatigue and weakness. °· Agitation. °· Anxiety. °· Confusion. °· Unusual behavior, such as hyperactivity, fear of water (hydrophobia), or fear of air (aerophobia). °· Hallucinations. °· Insomnia. °· Weakness in the arms or legs. °· Difficulty swallowing. °Most people get sick in 1-3 months after being bitten. This often varies and may depend on the location of the bite. The infection will take less time to develop if the bite occurred closer to the head.  °DIAGNOSIS  °To determine if a person is infected, several tests must be performed, such as: °· A skin biopsy. °· A saliva test. °· A lumbar puncture to remove spinal fluid so it can be examined. °· Blood tests. °TREATMENT  °Treatment to prevent the infection from developing (post-exposure prophylaxis, PEP) is often started before knowing for sure if the person has been exposed to the rabies virus. PEP  involves cleaning the wound, giving an antibody injection (rabies immune globulin), and giving a series of rabies vaccine injections. The series of injections are usually given over a two-week period. If possible, the animal that bit the person will be observed to see if it remains healthy. If the animal has been killed, it can be sent to a state laboratory and examined to see if the animal had rabies. °If a person is bitten by a domestic animal (dog, cat, or ferret) that appears healthy and can be observed to see if it remains healthy, often no further treatment is necessary other than care of the wounds caused by the animal. °Rabies is often a fatal illness once the infection develops in a person. Although a few people who developed rabies have survived after experimental treatment with certain drugs, all these survivors still had severe nervous system problems after the treatment. This is why caregivers use extra caution and begin PEP treatment for people who have been bitten by animals that are possibly infected with rabies.  °HOME CARE INSTRUCTIONS  °If you were bitten by an unknown animal, make sure you know your caregiver's instructions for follow-up. If the animal was sent to a laboratory for examination, ask when the test results will be ready. Make sure you get the test results.  °Take these steps to care for your wound: °· Keep the wound clean, dry, and dressed as directed by your caregiver. °· Keep the injured part elevated as much as possible. °· Do not resume use of the affected area   until directed. °· Only take over-the-counter or prescription medicines as directed by your caregiver. °· Keep all follow-up appointments as directed by your caregiver. °PREVENTION  °To prevent rabies, people need to reduce their risk of having contact with infected animals.  °· Make sure your pets (dogs, cats, ferrets) are vaccinated against rabies. Keep these vaccinations up-to-date as directed by your  veterinarian. °· Supervise your pets when they are outside. Keep them away from wild animals. °· Call your local animal control services to report any stray animals. These animals may not be vaccinated. °· Stay away from stray or wild animals. °· Consider getting the rabies vaccine (preexposure) if you are traveling to an area where rabies is common or if your job or activities involve possible contact with wild or stray animals. Discuss this with your caregiver. °Document Released: 04/17/2005 Document Revised: 01/10/2012 Document Reviewed: 11/14/2011 °ExitCare® Patient Information ©2015 [employer]. This information is not intended to replace advice given to you by your health care provider. Make sure you discuss any questions you have with your health care provider. ° °Rabies Vaccine °What You Need to Know °WHAT IS RABIES? °· Rabies is a serious disease. It is caused by a virus. °· Rabies is mainly a disease of animals. Humans get rabies when they are bitten by infected animals. °· At first there might not be any symptoms. But weeks, or even years after a bite, rabies can cause pain, fatigue, headaches, fever, and irritability. These are followed by seizures, hallucinations, and paralysis. Human rabies is almost always fatal. °· Wild animals, especially bats, are the most common source of human rabies infection in the [place]. Skunks, raccoons, dogs, cats, coyotes, foxes, and other mammals can also transmit the disease. °· Human rabies is rare in the [place]. There have been only 55 cases diagnosed since 1990. However, between 16,000 and 39,000 people are vaccinated each year as a precaution after animal bites. Also, rabies is far more common in other parts of the world, with about 40,000 to 70,000 rabies-related deaths worldwide each year. Bites from unvaccinated dogs cause most of these cases. °Rabies vaccine can prevent rabies. °RABIES VACCINE °· Rabies vaccine is given to people at high risk of  rabies to protect them if they are exposed. It can also prevent the disease if it is given to a person after they have been exposed. °· Rabies vaccine is made from killed rabies virus. It cannot cause rabies. °WHO SHOULD GET RABIES VACCINE AND WHEN? °Preventive Vaccination (No Exposure) °· People at high risk of exposure to rabies, such as veterinarians, animal handlers, rabies laboratory workers, spelunkers, and rabies biologics production workers should be offered rabies vaccine. °· The vaccine should also be considered for: °¨ People whose activities bring them into frequent contact with rabies virus or with possibly rabid animals. °¨ International travelers who are likely to come in contact with animals in parts of the world where rabies is common. °· The pre-exposure schedule for rabies vaccination is 3 doses, given at the following times: °¨ Dose 1: As appropriate. °¨ Dose 2: 7 days after Dose 1. °¨ Dose 3: 21 days or 28 days after Dose 1. °· For laboratory workers and others who may be repeatedly exposed to rabies virus, periodic testing for immunity is recommended and booster doses should be given as needed. (Testing or booster doses are not recommended for travelers). Ask your doctor for details. °Vaccination After an Exposure °Anyone who has been bitten by an animal, or   who otherwise may have been exposed to rabies, should clean the wound and see a doctor immediately. The doctor will determine if they need to be vaccinated. °A person who is exposed and has never been vaccinated against rabies should get 4 doses of rabies vaccine: one dose right away and additional doses on the 3rd, 7th, and 14th days. They should also get another shot called Rabies Immune Globulin at the same time as the first dose.  °A person who has been previously vaccinated should get 2 doses of rabies vaccine: one right away and another on the 3rd day. Rabies Immune Globulin is not needed. °TELL YOUR DOCTOR IF: °Talk with a doctor  before getting rabies vaccine if you: °· Ever had a serious (life-threatening) allergic reaction to a previous dose of rabies vaccine or to any component of the vaccine; tell your doctor if you have any severe allergies. °· Have a weakened immune system because of: °¨ HIV, AIDS, or another disease that affects the immune system. °¨ Treatment with drugs that affect the immune system, such as steroids. °¨ Cancer or cancer treatment with radiation or drugs. °If you have a minor illness, such as a cold, you can be vaccinated. If you are moderately or severely ill, you should probably wait until you recover before getting a routine (non-exposure) dose of rabies vaccine. °If you have been exposed to rabies virus, you should get the vaccine regardless of any other illnesses you may have. °WHAT ARE THE RISKS FROM RABIES VACCINE? °A vaccine, like any medicine, is capable of causing serious problems, such as severe allergic reactions. The risk of a vaccine causing serious harm, or death, is extremely small. Serious problems from rabies vaccine are very rare.  °Mild problems: °· Soreness, redness, swelling, or itching where the shot was given (30% to 74%). °· Headache, nausea, abdominal pain, muscle aches, or dizziness (5% to 40%). °Moderate problems: °· Hives, pain in the joints, or fever (about 6% of booster doses). °· Other nervous system disorders, such as Guillain-Barré Syndrome (GBS), have been reported after rabies vaccine, but this happens so rarely that it is not known whether they are related to the vaccine. °Note: Several brands of rabies vaccine are available in the [place], and reactions may vary between brands. Your provider can give you more information about a particular brand. °WHAT IF THERE IS A SERIOUS REACTION? °What should I look for? °Look for anything that concerns you, such as signs of a severe allergic reaction, very high fever, or behavior changes.  °Signs of a severe allergic reaction can  include hives, swelling of the face and throat, difficulty breathing, a fast heartbeat, dizziness, and weakness. These would start a few minutes to a few hours after the vaccination. °What should I do? °· If you think it is a severe allergic reaction or other emergency that cannot wait, call 911 or get the person to the nearest hospital. Otherwise, call your doctor. °· Afterward, the reaction should be reported to the Vaccine Adverse Event Reporting System (VAERS). Your doctor might file this report, or you can do it yourself through the VAERS website at www.vaers.hhs.gov or by calling 1-800-822-7967. °VAERS is only for reporting reactions. They do not give medical advice. °HOW CAN I LEARN MORE? °· Ask your doctor. °· Call your local or state health department. °· Contact the Centers for Disease Control and Prevention (CDC): °¨ Visit the CDC rabies website at www.cdc.gov/rabies/ °CDC Rabies Vaccine VIS (02/04/08) °Document Released: 02/12/2006 Document Revised:   04/03/2012 Document Reviewed: 08/07/2012 °ExitCare® Patient Information ©2015 [employer]. This information is not intended to replace advice given to you by your health care provider. Make sure you discuss any questions you have with your health care provider. ° °

## 2013-12-16 NOTE — ED Provider Notes (Signed)
CSN: [id number]     Arrival date & time 12/16/13  [id number] History  This chart was scribed for non-physician practitioner, Mellody DrownLauren Mckoy Bhakta, PA-C working with Rolland PorterMark James, MD by Greggory StallionKayla Andersen, ED scribe. This patient was seen in room TR08C/TR08C and the patient's care was started at 9:09 AM.   Chief Complaint  Patient presents with  . rabies exposure    HPI Comments: Kayla Wells is a 80 y.o. female who presents to the Emergency Department complaining of rabies exposure that occurred 5 days ago. States her dogs got into a fight with a rabid raccoon. Pt did not have direct contact with the raccoon but has with her dogs. The dogs are up to date on their vaccinations. Pt has no scratches or open wounds currently. The dogs have not scratched her since exposure.   The history is provided by the patient. No language interpreter was used.    Past Medical History  Diagnosis Date  . Other and unspecified hyperlipidemia   . Unspecified essential hypertension   . GERD (gastroesophageal reflux disease)   . Pulmonary fibrosis   . Arthritis    Past Surgical History  Procedure Laterality Date  . Nasal sinus surgery    . Vesicovaginal fistula closure w/ tah  1978  . Foot susrgery    . Revision total knee arthroplasty    . Total hip arthroplasty      right   Family History  Problem Relation Age of Onset  . Heart disease Father   . Atopy Neg Hx    History  Substance Use Topics  . Smoking status: Never Smoker   . Smokeless tobacco: Never Used  . Alcohol Use: No   OB History   Grav Para Term Preterm Abortions TAB SAB Ect Mult Living                 Review of Systems  Skin: Negative for wound.  All other systems reviewed and are negative.  Allergies  Macrodantin  Home Medications   Prior to Admission medications   Medication Sig Start Date End Date Taking? Authorizing Provider  acetaminophen (TYLENOL) 500 MG tablet Take 500 mg by mouth every 6 (six) hours as needed.     Historical  Provider, MD  ALPRAZolam Prudy Feeler(XANAX) 0.25 MG tablet Take 0.25 mg by mouth daily as needed. For anxiety    Historical Provider, MD  famotidine (PEPCID) 20 MG tablet One at bedtime 09/30/10 09/30/11  Nyoka CowdenMichael B Wert, MD  fluticasone [facility]FLONASE) 50 MCG/ACT nasal spray 2 sprays by Nasal route daily.      Historical Provider, MD  levothyroxine (SYNTHROID, LEVOTHROID) 100 MCG tablet Take 100 mcg by mouth daily.      Historical Provider, MD  meloxicam (MOBIC) 15 MG tablet Take 15 mg by mouth daily.      Historical Provider, MD  olmesartan-hydrochlorothiazide (BENICAR HCT) 40-25 MG per tablet 1/2 once daily     Historical Provider, MD  oxyCODONE-acetaminophen (PERCOCET) 5-325 MG per tablet Take 2 tablets by mouth every 4 (four) hours as needed for pain. 04/16/12   Richardean Canalavid H Yao, MD  oxymetazoline (AFRIN) 0.05 % nasal spray 2 sprays by Nasal route daily as needed.      Historical Provider, MD  rosuvastatin (CRESTOR) 10 MG tablet Take 10 mg by mouth daily.      Historical Provider, MD   BP 158/71  Pulse 72  Temp(Src) 97.4 F (36.3 C) (Oral)  Resp 18  Ht 5' (1.524 m)  Wt 157  lb (71.215 kg)  BMI 30.66 kg/m2  SpO2 95%  Physical Exam  Nursing note and vitals reviewed. Constitutional: She is oriented to person, place, and time. She appears well-developed and well-nourished.  Non-toxic appearance. She does not have a sickly appearance. She does not appear ill. No distress.  HENT:  Head: Normocephalic and atraumatic.  Eyes: Conjunctivae and EOM are normal.  Neck: Neck supple.  Pulmonary/Chest: Effort normal. No respiratory distress.  Musculoskeletal: Normal range of motion.  Neurological: She is alert and oriented to person, place, and time.  Skin: Skin is warm and dry. She is not diaphoretic.  Psychiatric: She has a normal mood and affect. Her behavior is normal.    ED Course  Procedures (including critical care time)  COORDINATION OF CARE: 9:12 AM-Discussed treatment plan which includes rabies  vaccinations with pt at bedside and pt agreed to plan.   Labs Review Labs Reviewed - No data to display  Imaging Review No results found.   EKG Interpretation None      MDM   Final diagnoses:  Rabies exposure   Patient presents after positive raccoon vs family dog.  Plan to give rabies vaccination and discharge home.  Meds given in ED:  Medications  rabies vaccine (RABAVERT) injection 1 mL (1 mL Intramuscular Given 12/16/13 1044)  rabies immune globulin (HYPERAB) injection 1,425 Units (1,425 Units Intramuscular Given 12/16/13 1046)    New Prescriptions   No medications on file   I personally performed the services described in this documentation, which was scribed in my presence. The recorded information has been reviewed and is accurate.  Mellody Drown, PA-C 12/16/13 1105

## 2013-12-16 NOTE — ED Notes (Signed)
Patient states her dog got into a fight with a rabid raccoon.  Patient states she did not have direct contact with raccoon, but did have contact with dog.   Patient states her dog is up to date on all vaccines.

## 2013-12-19 ENCOUNTER — Emergency Department (HOSPITAL_COMMUNITY)
Admission: EM | Admit: 2013-12-19 | Discharge: 2013-12-19 | Disposition: A | Discharge status: Home / Self Care | Payer: Medicare Other | Attending: Emergency Medicine | Admitting: Emergency Medicine

## 2013-12-19 ENCOUNTER — Encounter (HOSPITAL_COMMUNITY): Payer: Self-pay | Admitting: Emergency Medicine

## 2013-12-19 DIAGNOSIS — E785 Hyperlipidemia, unspecified: Secondary | ICD-10-CM | POA: Insufficient documentation

## 2013-12-19 DIAGNOSIS — Z8709 Personal history of other diseases of the respiratory system: Secondary | ICD-10-CM | POA: Insufficient documentation

## 2013-12-19 DIAGNOSIS — IMO0002 Reserved for concepts with insufficient information to code with codable children: Secondary | ICD-10-CM | POA: Diagnosis not present

## 2013-12-19 DIAGNOSIS — M129 Arthropathy, unspecified: Secondary | ICD-10-CM | POA: Diagnosis not present

## 2013-12-19 DIAGNOSIS — Z8719 Personal history of other diseases of the digestive system: Secondary | ICD-10-CM | POA: Insufficient documentation

## 2013-12-19 DIAGNOSIS — Z791 Long term (current) use of non-steroidal anti-inflammatories (NSAID): Secondary | ICD-10-CM | POA: Diagnosis not present

## 2013-12-19 DIAGNOSIS — Z23 Encounter for immunization: Secondary | ICD-10-CM | POA: Diagnosis present

## 2013-12-19 DIAGNOSIS — I10 Essential (primary) hypertension: Secondary | ICD-10-CM | POA: Diagnosis not present

## 2013-12-19 DIAGNOSIS — Z79899 Other long term (current) drug therapy: Secondary | ICD-10-CM | POA: Insufficient documentation

## 2013-12-19 MED ORDER — RABIES VACCINE, PCEC IM SUSR
1.0000 mL | Freq: Once | INTRAMUSCULAR | Status: AC
  Administered 2013-12-19: 1 mL via INTRAMUSCULAR
  Filled 2013-12-19 (×2): qty 1

## 2013-12-19 NOTE — ED Notes (Signed)
Here for a 2nd rabies vaccine-missed [facility] by one minute this evening.

## 2013-12-19 NOTE — Discharge Instructions (Signed)
Follow your immunization schedule as directed.

## 2013-12-19 NOTE — ED Notes (Signed)
Pt has no complaints. Needs second dose of rabies vaccination

## 2013-12-19 NOTE — ED Provider Notes (Signed)
CSN: [id number]     Arrival date & time 12/19/13  2007 History   First MD Initiated Contact with Patient 12/19/13 2204     Chief Complaint  Patient presents with  . Immunizations     (Consider location/radiation/quality/duration/timing/severity/associated sxs/prior Treatment) HPI Comments: Patient is an 80 year old female who presents for her second rabies vaccine. Patient reports going to Urgent Care this evening and got there 1 minute prior to closing. She has no symptoms at this time. She has no complaints. Patient is on schedule for vaccines.    Past Medical History  Diagnosis Date  . Other and unspecified hyperlipidemia   . Unspecified essential hypertension   . GERD (gastroesophageal reflux disease)   . Pulmonary fibrosis   . Arthritis    Past Surgical History  Procedure Laterality Date  . Nasal sinus surgery    . Vesicovaginal fistula closure w/ tah  1978  . Foot susrgery    . Revision total knee arthroplasty    . Total hip arthroplasty      right   Family History  Problem Relation Age of Onset  . Heart disease Father   . Atopy Neg Hx    History  Substance Use Topics  . Smoking status: Never Smoker   . Smokeless tobacco: Never Used  . Alcohol Use: No   OB History   Grav Para Term Preterm Abortions TAB SAB Ect Mult Living                 Review of Systems  Constitutional: Negative for fever, chills and fatigue.       Vaccine  HENT: Negative for trouble swallowing.   Eyes: Negative for visual disturbance.  Respiratory: Negative for shortness of breath.   Cardiovascular: Negative for chest pain and palpitations.  Gastrointestinal: Negative for nausea, vomiting, abdominal pain and diarrhea.  Genitourinary: Negative for dysuria and difficulty urinating.  Musculoskeletal: Negative for arthralgias and neck pain.  Skin: Negative for color change.  Neurological: Negative for dizziness and weakness.  Psychiatric/Behavioral: Negative for dysphoric mood.       Allergies  Macrodantin  Home Medications   Prior to Admission medications   Medication Sig Start Date End Date Taking? Authorizing Provider  acetaminophen (TYLENOL) 500 MG tablet Take 500 mg by mouth every 6 (six) hours as needed.     Historical Provider, MD  ALPRAZolam Prudy Feeler(XANAX) 0.25 MG tablet Take 0.25 mg by mouth daily as needed. For anxiety    Historical Provider, MD  famotidine (PEPCID) 20 MG tablet One at bedtime 09/30/10 09/30/11  Nyoka CowdenMichael B Wert, MD  fluticasone [facility](FLONASE) 50 MCG/ACT nasal spray 2 sprays by Nasal route daily.      Historical Provider, MD  levothyroxine (SYNTHROID, LEVOTHROID) 100 MCG tablet Take 100 mcg by mouth daily.      Historical Provider, MD  meloxicam (MOBIC) 15 MG tablet Take 15 mg by mouth daily.      Historical Provider, MD  olmesartan-hydrochlorothiazide (BENICAR HCT) 40-25 MG per tablet 1/2 once daily     Historical Provider, MD  oxyCODONE-acetaminophen (PERCOCET) 5-325 MG per tablet Take 2 tablets by mouth every 4 (four) hours as needed for pain. 04/16/12   Richardean Canalavid H Yao, MD  oxymetazoline (AFRIN) 0.05 % nasal spray 2 sprays by Nasal route daily as needed.      Historical Provider, MD  rosuvastatin (CRESTOR) 10 MG tablet Take 10 mg by mouth daily.      Historical Provider, MD   BP 151/66  Pulse  66  Temp(Src) 98 F (36.7 C) (Oral)  Resp 18  SpO2 95% Physical Exam  Nursing note and vitals reviewed. Constitutional: She appears well-developed and well-nourished. No distress.  HENT:  Head: Normocephalic and atraumatic.  Eyes: Conjunctivae are normal.  Neck: Normal range of motion.  Cardiovascular: Normal rate and regular rhythm.  Exam reveals no gallop and no friction rub.   No murmur heard. Pulmonary/Chest: Effort normal and breath sounds normal. She has no wheezes. She has no rales. She exhibits no tenderness.  Abdominal: Soft.  Musculoskeletal: Normal range of motion.  Neurological: She is alert.  Speech is goal-oriented. Moves limbs without  ataxia.   Skin: Skin is warm and dry.  Psychiatric: She has a normal mood and affect. Her behavior is normal.    ED Course  Procedures (including critical care time) Labs Review Labs Reviewed - No data to display  Imaging Review No results found.   EKG Interpretation None      MDM   Final diagnoses:  Need for rabies vaccination    10:04 PM Patient will have rabies vaccine here and be discharged. Vitals stable and patient afebrile.    Emilia Beck, PA-C 12/19/13 2242

## 2013-12-21 NOTE — ED Provider Notes (Signed)
Medical screening examination/treatment/procedure(s) were performed by non-physician practitioner and as supervising physician I was immediately available for consultation/collaboration.   EKG Interpretation None        Taysom Glymph, MD 12/21/13 1655 

## 2013-12-23 ENCOUNTER — Encounter (HOSPITAL_COMMUNITY): Payer: Self-pay | Admitting: Emergency Medicine

## 2013-12-23 ENCOUNTER — Emergency Department (HOSPITAL_COMMUNITY)
Admission: EM | Admit: 2013-12-23 | Discharge: 2013-12-23 | Disposition: A | Discharge status: Home / Self Care | Payer: Medicare Other | Attending: Family Medicine | Admitting: Family Medicine

## 2013-12-23 DIAGNOSIS — M129 Arthropathy, unspecified: Secondary | ICD-10-CM | POA: Diagnosis not present

## 2013-12-23 DIAGNOSIS — Z791 Long term (current) use of non-steroidal anti-inflammatories (NSAID): Secondary | ICD-10-CM | POA: Insufficient documentation

## 2013-12-23 DIAGNOSIS — Z23 Encounter for immunization: Secondary | ICD-10-CM | POA: Diagnosis present

## 2013-12-23 DIAGNOSIS — E785 Hyperlipidemia, unspecified: Secondary | ICD-10-CM | POA: Insufficient documentation

## 2013-12-23 DIAGNOSIS — I10 Essential (primary) hypertension: Secondary | ICD-10-CM | POA: Insufficient documentation

## 2013-12-23 DIAGNOSIS — Z79899 Other long term (current) drug therapy: Secondary | ICD-10-CM | POA: Diagnosis not present

## 2013-12-23 DIAGNOSIS — Z8709 Personal history of other diseases of the respiratory system: Secondary | ICD-10-CM | POA: Insufficient documentation

## 2013-12-23 DIAGNOSIS — Z8719 Personal history of other diseases of the digestive system: Secondary | ICD-10-CM | POA: Insufficient documentation

## 2013-12-23 DIAGNOSIS — IMO0002 Reserved for concepts with insufficient information to code with codable children: Secondary | ICD-10-CM | POA: Insufficient documentation

## 2013-12-23 MED ORDER — RABIES VACCINE, PCEC IM SUSR
1.0000 mL | Freq: Once | INTRAMUSCULAR | Status: AC
  Administered 2013-12-23: 1 mL via INTRAMUSCULAR
  Filled 2013-12-23: qty 1

## 2013-12-23 NOTE — ED Provider Notes (Signed)
CSN: [id number]     Arrival date & time 12/23/13  8657 History   First MD Initiated Contact with Patient 12/23/13 [phone]     Chief Complaint  Patient presents with  . Rabies Injection     (Consider location/radiation/quality/duration/timing/severity/associated sxs/prior Treatment) Patient is a 80 y.o. female presenting with general illness.  Illness Chronicity:  Recurrent Context:  Here for 3rd rabies vacc shot,of series. no problems to date. Associated symptoms: no fever and no rash     Past Medical History  Diagnosis Date  . Other and unspecified hyperlipidemia   . Unspecified essential hypertension   . GERD (gastroesophageal reflux disease)   . Pulmonary fibrosis   . Arthritis    Past Surgical History  Procedure Laterality Date  . Nasal sinus surgery    . Vesicovaginal fistula closure w/ tah  1978  . Foot susrgery    . Revision total knee arthroplasty    . Total hip arthroplasty      right   Family History  Problem Relation Age of Onset  . Heart disease Father   . Atopy Neg Hx    History  Substance Use Topics  . Smoking status: Never Smoker   . Smokeless tobacco: Never Used  . Alcohol Use: No   OB History   Grav Para Term Preterm Abortions TAB SAB Ect Mult Living                 Review of Systems  Constitutional: Negative for fever.  Skin: Negative for rash.      Allergies  Macrodantin  Home Medications   Prior to Admission medications   Medication Sig Start Date End Date Taking? Authorizing Provider  acetaminophen (TYLENOL) 500 MG tablet Take 500 mg by mouth every 6 (six) hours as needed.     Historical Provider, MD  ALPRAZolam Prudy Feeler) 0.25 MG tablet Take 0.25 mg by mouth daily as needed. For anxiety    Historical Provider, MD  famotidine (PEPCID) 20 MG tablet One at bedtime 09/30/10 09/30/11  Nyoka Cowden, MD  fluticasone [facility]) 50 MCG/ACT nasal spray 2 sprays by Nasal route daily.      Historical Provider, MD  levothyroxine (SYNTHROID,  LEVOTHROID) 100 MCG tablet Take 100 mcg by mouth daily.      Historical Provider, MD  meloxicam (MOBIC) 15 MG tablet Take 15 mg by mouth daily.      Historical Provider, MD  olmesartan-hydrochlorothiazide (BENICAR HCT) 40-25 MG per tablet 1/2 once daily     Historical Provider, MD  oxyCODONE-acetaminophen (PERCOCET) 5-325 MG per tablet Take 2 tablets by mouth every 4 (four) hours as needed for pain. 04/16/12   Richardean Canal, MD  oxymetazoline (AFRIN) 0.05 % nasal spray 2 sprays by Nasal route daily as needed.      Historical Provider, MD  rosuvastatin (CRESTOR) 10 MG tablet Take 10 mg by mouth daily.      Historical Provider, MD   BP 147/69  Pulse 67  Temp(Src) 97.7 F (36.5 C) (Oral)  Resp 18  Ht 5' (1.524 m)  Wt 154 lb (69.854 kg)  BMI 30.08 kg/m2  SpO2 93% Physical Exam  Nursing note and vitals reviewed. Constitutional: She is oriented to person, place, and time. She appears well-developed and well-nourished.  Neurological: She is alert and oriented to person, place, and time.  Skin: Skin is warm and dry. No rash noted.    ED Course  Procedures (including critical care time) Labs Review Labs Reviewed - No data  to display  Imaging Review No results found.   EKG Interpretation None      MDM   Final diagnoses:  Need for rabies vaccination        Linna Hoff, MD 12/23/13 [phone]

## 2013-12-23 NOTE — ED Notes (Signed)
Waiting for vaccine to be sent from pharmacy. 

## 2013-12-23 NOTE — ED Notes (Signed)
Patient has no complaints.   Here for 3rd set of rabies shots.

## 2013-12-23 NOTE — Discharge Instructions (Signed)
Return in 1 week for final vacc as advised. °

## 2013-12-30 ENCOUNTER — Emergency Department (HOSPITAL_COMMUNITY)
Admission: EM | Admit: 2013-12-30 | Discharge: 2013-12-30 | Disposition: A | Discharge status: Home / Self Care | Payer: Medicare Other | Source: Home / Self Care

## 2013-12-30 ENCOUNTER — Encounter (HOSPITAL_COMMUNITY): Payer: Self-pay | Admitting: Emergency Medicine

## 2013-12-30 DIAGNOSIS — Z203 Contact with and (suspected) exposure to rabies: Secondary | ICD-10-CM

## 2013-12-30 MED ORDER — RABIES VACCINE, PCEC IM SUSR
INTRAMUSCULAR | Status: AC
  Filled 2013-12-30: qty 1

## 2013-12-30 MED ORDER — RABIES VACCINE, PCEC IM SUSR
1.0000 mL | Freq: Once | INTRAMUSCULAR | Status: AC
  Administered 2013-12-30: 1 mL via INTRAMUSCULAR

## 2013-12-30 NOTE — Discharge Instructions (Signed)
Return as  Directed  Sooner if  needed

## 2013-12-30 NOTE — ED Notes (Signed)
Pt  Here  For  Next  Rabies  injection

## 2013-12-30 NOTE — ED Provider Notes (Signed)
Medical screening examination/treatment/procedure(s) were performed by non-physician practitioner and as supervising physician I was immediately available for consultation/collaboration.   EKG Interpretation None       Glenetta Kiger M Lathaniel Legate, MD 12/30/13 1932 

## 2015-05-07 ENCOUNTER — Other Ambulatory Visit (HOSPITAL_COMMUNITY): Payer: Self-pay | Admitting: Endocrinology

## 2015-05-07 ENCOUNTER — Ambulatory Visit (HOSPITAL_COMMUNITY)
Admission: RE | Admit: 2015-05-07 | Discharge: 2015-05-07 | Disposition: A | Discharge status: Home / Self Care | Payer: Medicare Other | Source: Ambulatory Visit | Attending: Vascular Surgery | Admitting: Vascular Surgery

## 2015-05-07 DIAGNOSIS — I6523 Occlusion and stenosis of bilateral carotid arteries: Secondary | ICD-10-CM | POA: Diagnosis not present

## 2015-05-07 DIAGNOSIS — E785 Hyperlipidemia, unspecified: Secondary | ICD-10-CM | POA: Diagnosis not present

## 2015-05-07 DIAGNOSIS — I10 Essential (primary) hypertension: Secondary | ICD-10-CM | POA: Insufficient documentation

## 2015-05-07 DIAGNOSIS — M6289 Other specified disorders of muscle: Secondary | ICD-10-CM | POA: Insufficient documentation

## 2015-05-07 DIAGNOSIS — R531 Weakness: Secondary | ICD-10-CM

## 2016-01-21 ENCOUNTER — Emergency Department (HOSPITAL_COMMUNITY)
Admission: EM | Admit: 2016-01-21 | Discharge: 2016-01-21 | Disposition: A | Discharge status: Home / Self Care | Payer: Medicare Other | Attending: Emergency Medicine | Admitting: Emergency Medicine

## 2016-01-21 ENCOUNTER — Encounter (HOSPITAL_COMMUNITY): Payer: Self-pay | Admitting: Emergency Medicine

## 2016-01-21 ENCOUNTER — Emergency Department (HOSPITAL_COMMUNITY): Payer: Medicare Other

## 2016-01-21 DIAGNOSIS — Z7982 Long term (current) use of aspirin: Secondary | ICD-10-CM | POA: Diagnosis not present

## 2016-01-21 DIAGNOSIS — Z96641 Presence of right artificial hip joint: Secondary | ICD-10-CM | POA: Diagnosis not present

## 2016-01-21 DIAGNOSIS — G459 Transient cerebral ischemic attack, unspecified: Secondary | ICD-10-CM | POA: Insufficient documentation

## 2016-01-21 DIAGNOSIS — Z79899 Other long term (current) drug therapy: Secondary | ICD-10-CM | POA: Insufficient documentation

## 2016-01-21 DIAGNOSIS — I10 Essential (primary) hypertension: Secondary | ICD-10-CM | POA: Diagnosis not present

## 2016-01-21 DIAGNOSIS — R4701 Aphasia: Secondary | ICD-10-CM | POA: Diagnosis present

## 2016-01-21 LAB — I-STAT CHEM 8, ED
BUN: 24 mg/dL — ABNORMAL HIGH (ref 6–20)
Calcium, Ion: 1.14 mmol/L — ABNORMAL LOW (ref 1.15–1.40)
Chloride: 103 mmol/L (ref 101–111)
Creatinine, Ser: 0.8 mg/dL (ref 0.44–1.00)
Glucose, Bld: 96 mg/dL (ref 65–99)
HEMATOCRIT: 46 % (ref 36.0–46.0)
HEMOGLOBIN: 15.6 g/dL — AB (ref 12.0–15.0)
Potassium: 3.6 mmol/L (ref 3.5–5.1)
SODIUM: 141 mmol/L (ref 135–145)
TCO2: 25 mmol/L (ref 0–100)

## 2016-01-21 LAB — COMPREHENSIVE METABOLIC PANEL
ALT: 19 U/L (ref 14–54)
AST: 26 U/L (ref 15–41)
Albumin: 3.9 g/dL (ref 3.5–5.0)
Alkaline Phosphatase: 35 U/L — ABNORMAL LOW (ref 38–126)
Anion gap: 8 (ref 5–15)
BUN: 22 mg/dL — ABNORMAL HIGH (ref 6–20)
CHLORIDE: 107 mmol/L (ref 101–111)
CO2: 25 mmol/L (ref 22–32)
Calcium: 9.7 mg/dL (ref 8.9–10.3)
Creatinine, Ser: 0.74 mg/dL (ref 0.44–1.00)
Glucose, Bld: 97 mg/dL (ref 65–99)
POTASSIUM: 3.6 mmol/L (ref 3.5–5.1)
SODIUM: 140 mmol/L (ref 135–145)
Total Bilirubin: 0.6 mg/dL (ref 0.3–1.2)
Total Protein: 7.2 g/dL (ref 6.5–8.1)

## 2016-01-21 LAB — CBC
HEMATOCRIT: 44.8 % (ref 36.0–46.0)
HEMOGLOBIN: 14.7 g/dL (ref 12.0–15.0)
MCH: 29.5 pg (ref 26.0–34.0)
MCHC: 32.8 g/dL (ref 30.0–36.0)
MCV: 90 fL (ref 78.0–100.0)
Platelets: 227 10*3/uL (ref 150–400)
RBC: 4.98 MIL/uL (ref 3.87–5.11)
RDW: 14.4 % (ref 11.5–15.5)
WBC: 6.7 10*3/uL (ref 4.0–10.5)

## 2016-01-21 LAB — DIFFERENTIAL
BASOS ABS: 0 10*3/uL (ref 0.0–0.1)
Basophils Relative: 0 %
EOS ABS: 0.2 10*3/uL (ref 0.0–0.7)
EOS PCT: 3 %
LYMPHS ABS: 1.9 10*3/uL (ref 0.7–4.0)
Lymphocytes Relative: 28 %
MONO ABS: 0.5 10*3/uL (ref 0.1–1.0)
MONOS PCT: 8 %
Neutro Abs: 4.1 10*3/uL (ref 1.7–7.7)
Neutrophils Relative %: 61 %

## 2016-01-21 LAB — I-STAT TROPONIN, ED: Troponin i, poc: 0 ng/mL (ref 0.00–0.08)

## 2016-01-21 LAB — APTT: aPTT: 29 seconds (ref 24–36)

## 2016-01-21 LAB — PROTIME-INR
INR: 1.07
Prothrombin Time: 14 seconds (ref 11.4–15.2)

## 2016-01-21 LAB — CBG MONITORING, ED: GLUCOSE-CAPILLARY: 99 mg/dL (ref 65–99)

## 2016-01-21 NOTE — ED Triage Notes (Signed)
Pt from home with c/o of sudden onset right hand/arm weakness and slurred speech around 830 am this morning lasting around 10 minutes.  All symptoms have resolved at this time.  Pt in NAD, A&O.

## 2016-01-21 NOTE — ED Notes (Signed)
CBG- 99 

## 2016-01-21 NOTE — ED Notes (Signed)
Patient transported to CT 

## 2016-01-21 NOTE — ED Provider Notes (Signed)
[facility]-EMERGENCY DEPT Provider Note   CSN: [id number] Arrival date & time: 01/21/16  1219     History   Chief Complaint Chief Complaint  Patient presents with  . Aphasia    HPI Kayla Wells is a 82 y.o. female.  The history is provided by the patient.  Weakness  Primary symptoms include focal weakness, speech change. This is a new problem. Episode onset: 2 hrs prior to arrival. The problem has been resolved. There was right upper extremity focality noted. There has been no fever. Pertinent negatives include no shortness of breath, no chest pain, no vomiting and no headaches. Associated medical issues comments: previous h/o TIA.  patient presents for concerns for mini stroke Pt reports she was "stirring gravy" and had weakness in her right hand/arm for about 10-15 minutes She spoke to son on the phone at same time and it is reported she had slurred speech No fever/vomiting No visual changes No HA No leg weakness She does not think she had facial droop She is now back to baseline She reports previous h/o TIA several yrs ago  Past Medical History:  Diagnosis Date  . Arthritis   . GERD (gastroesophageal reflux disease)   . Other and unspecified hyperlipidemia   . Pulmonary fibrosis (HCC)   . Unspecified essential hypertension     Patient Active Problem List   Diagnosis Date Noted  . Cough 08/29/2010  . GERD 05/14/2009  . CYSTITIS, ACUTE 05/05/2009  . HYPERLIPIDEMIA 04/14/2009  . HYPERTENSION 04/14/2009  . PULMONARY FIBROSIS ILD POST INFLAMMATORY CHRONIC 04/14/2009    Past Surgical History:  Procedure Laterality Date  . foot susrgery    . NASAL SINUS SURGERY    . REVISION TOTAL KNEE ARTHROPLASTY    . TOTAL HIP ARTHROPLASTY     right  . VESICOVAGINAL FISTULA CLOSURE W/ TAH  1978    OB History    No data available       Home Medications    Prior to Admission medications   Medication Sig Start Date End Date Taking? Authorizing Provider  acetaminophen  (TYLENOL) 500 MG tablet Take 500 mg by mouth every 6 (six) hours as needed.    Yes Historical Provider, MD  ALPRAZolam (XANAX) 0.25 MG tablet Take 0.25 mg by mouth daily as needed. For anxiety   Yes Historical Provider, MD  aspirin EC 81 MG tablet Take 81 mg by mouth daily.   Yes Historical Provider, MD  Azilsartan-Chlorthalidone 40-25 MG TABS Take 0.5 mg by mouth daily.   Yes Historical Provider, MD  fluticasone (FLONASE) 50 MCG/ACT nasal spray 2 sprays by Nasal route daily.     Yes Historical Provider, MD  levothyroxine (SYNTHROID, LEVOTHROID) 137 MCG tablet Take 137 mcg by mouth daily before breakfast.   Yes Historical Provider, MD  meloxicam (MOBIC) 15 MG tablet Take 15 mg by mouth daily.     Yes Historical Provider, MD  oxymetazoline (AFRIN) 0.05 % nasal spray Place 2 sprays into the nose daily as needed for congestion.    Yes Historical Provider, MD  rosuvastatin (CRESTOR) 10 MG tablet Take 10 mg by mouth daily.     Yes Historical Provider, MD  famotidine (PEPCID) 20 MG tablet One at bedtime 09/30/10 09/30/11  Nyoka CowdenMichael B Wert, MD  oxyCODONE-acetaminophen (PERCOCET) 5-325 MG per tablet Take 2 tablets by mouth every 4 (four) hours as needed for pain. Patient not taking: Reported on 01/21/2016 04/16/12   Charlynne Panderavid Hsienta Yao, MD    Family History Family History  Problem Relation Age of Onset  . Heart disease Father   . Atopy Neg Hx     Social History Social History  Substance Use Topics  . Smoking status: Never Smoker  . Smokeless tobacco: Never Used  . Alcohol use No     Allergies   Macrodantin [nitrofurantoin macrocrystal]   Review of Systems Review of Systems  Constitutional: Negative for fever.  Eyes: Negative for visual disturbance.  Respiratory: Negative for shortness of breath.   Cardiovascular: Negative for chest pain.  Gastrointestinal: Negative for vomiting.  Neurological: Positive for speech change, focal weakness, speech difficulty and weakness. Negative for headaches.    All other systems reviewed and are negative.    Physical Exam Updated Vital Signs BP 175/83   Pulse 67   Temp 98 F (36.7 C)   Resp 16   SpO2 99%   Physical Exam CONSTITUTIONAL: Well developed/well nourished HEAD: Normocephalic/atraumatic EYES: EOMI/PERRL, no nystagmus,no ptosis ENMT: Mucous membranes moist NECK: supple no meningeal signs CV: S1/S2 noted, no murmurs/rubs/gallops noted LUNGS: Lungs are clear to auscultation bilaterally, no apparent distress ABDOMEN: soft, nontender NEURO:Awake/alert, face symmetric, no arm or leg drift is noted Equal 5/5 strength with shoulder abduction, elbow flex/extension, wrist flex/extension in upper extremities Cranial nerves 3/4/5/6/11/06/08/11/12 tested and intact Sensation to light touch intact in all extremities EXTREMITIES: pulses normal, full ROM SKIN: warm, color normal PSYCH: no abnormalities of mood noted   ED Treatments / Results  Labs (all labs ordered are listed, but only abnormal results are displayed) Labs Reviewed  COMPREHENSIVE METABOLIC PANEL - Abnormal; Notable for the following:       Result Value   BUN 22 (*)    Alkaline Phosphatase 35 (*)    All other components within normal limits  I-STAT CHEM 8, ED - Abnormal; Notable for the following:    BUN 24 (*)    Calcium, Ion 1.14 (*)    Hemoglobin 15.6 (*)    All other components within normal limits  PROTIME-INR  APTT  CBC  DIFFERENTIAL  I-STAT TROPOININ, ED  CBG MONITORING, ED    EKG  EKG Interpretation  Date/Time:  Friday January 21 2016 12:30:23 EDT Ventricular Rate:  79 PR Interval:  216 QRS Duration: 88 QT Interval:  372 QTC Calculation: 426 R Axis:   -49 Text Interpretation:  Sinus rhythm with 1st degree A-V block Left anterior fascicular block Anterior infarct , age undetermined Abnormal ECG Confirmed by Bebe Shaggy  MD, Tamarcus Condie ([id number]) on 01/21/2016 1:41:59 PM       Radiology Ct Head Wo Contrast  Result Date: 01/21/2016 CLINICAL DATA:   Weakness in the right arm last night. Slurred speech. EXAM: CT HEAD WITHOUT CONTRAST TECHNIQUE: Contiguous axial images were obtained from the base of the skull through the vertex without intravenous contrast. COMPARISON:  None. FINDINGS: Brain: No evidence of acute infarction, hemorrhage, hydrocephalus, extra-axial collection or mass lesion/mass effect. There is prominence of the sulci identified bilaterally compatible with age related parenchymal volume loss. Vascular: No hyperdense vessel or unexpected calcification. Skull: The osseous skull is intact. Sinuses/Orbits: Retention cyst versus polyp is identified in the right maxillary sinus. The remaining paranasal sinuses are clear. The mastoid air cells are clear. Other: None IMPRESSION: 1. No acute a intracranial abnormalities. 2. Right maxillary sinus polyp versus retention cyst. Electronically Signed   By: Signa Kell M.D.   On: 01/21/2016 13:49    Procedures Procedures   Medications Ordered in ED Medications - No data to display   Initial  Impression / Assessment and Plan / ED Course  I have reviewed the triage vital signs and the nursing notes.  Pertinent labs  results that were available during my care of the patient were reviewed by me and considered in my medical decision making (see chart for details).  Clinical Course    Pt here with probable TIA She is now back to baseline She is awake/alert She does NOT want to be admitted We discussed risk of going home including risk of recurrent stroke and she understands this and accepts risk She will continue ASA daily Advised need for close PCP followup Advised she can return at anytime D/w family who is at bedside  Final Clinical Impressions(s) / ED Diagnoses   Final diagnoses:  Transient cerebral ischemia, unspecified transient cerebral ischemia type    New Prescriptions New Prescriptions   No medications on file     Zadie Rhine, MD 01/21/16 1535

## 2016-01-27 ENCOUNTER — Ambulatory Visit (HOSPITAL_COMMUNITY): Payer: Medicare Other | Attending: Cardiology

## 2016-01-27 ENCOUNTER — Other Ambulatory Visit: Payer: Self-pay

## 2016-01-27 ENCOUNTER — Encounter: Payer: Self-pay | Admitting: Cardiovascular Disease

## 2016-01-27 ENCOUNTER — Ambulatory Visit (INDEPENDENT_AMBULATORY_CARE_PROVIDER_SITE_OTHER): Payer: Medicare Other | Admitting: Cardiovascular Disease

## 2016-01-27 VITALS — BP 140/90 | HR 72 | Ht 60.0 in | Wt 140.4 lb

## 2016-01-27 DIAGNOSIS — R0609 Other forms of dyspnea: Secondary | ICD-10-CM

## 2016-01-27 DIAGNOSIS — Z7189 Other specified counseling: Secondary | ICD-10-CM | POA: Diagnosis not present

## 2016-01-27 DIAGNOSIS — Z7689 Persons encountering health services in other specified circumstances: Secondary | ICD-10-CM

## 2016-01-27 DIAGNOSIS — G458 Other transient cerebral ischemic attacks and related syndromes: Secondary | ICD-10-CM | POA: Insufficient documentation

## 2016-01-27 DIAGNOSIS — J8410 Pulmonary fibrosis, unspecified: Secondary | ICD-10-CM

## 2016-01-27 LAB — ECHOCARDIOGRAM COMPLETE
Height: 60 in
Weight: 2246.4 oz

## 2016-01-27 NOTE — Progress Notes (Signed)
Cardiology Office Note   Date:  01/27/2016   ID:  Kayla Wells, DOB [birth year], MRN [id number]  PCP:  Kayla Hy, MD  Cardiologist:   Charlton Haws, MD   Chief Complaint  Patient presents with  . Establish Care      History of Present Illness: Kayla Wells is a 82 y.o. female who presents for evaluation of TIA, dyspnea and left arm pain. She is a non smoker but has ILD Seen in ER 9/22 with RUE weakness and speech changes. Previous TIA years ago  CT NAD   ECG SR poor R wave progression ? Anterior MI and LAFB  Carotids 05/07/15 Less than 40% bilateral Dx   Her left arm pain is non anginal sounding and occurs at rest. Worsening with motion No associated SSCP. Dyspnea is chronic and related to ILD She has seen Dr Sherene Sires But does not like him " he has no bedside manner"    Denies palpitations or history of PAF.  No contraindications to anticoagulation  Past Medical History:  Diagnosis Date  . Arthritis   . GERD (gastroesophageal reflux disease)   . Other and unspecified hyperlipidemia   . Pulmonary fibrosis (HCC)   . Unspecified essential hypertension     Past Surgical History:  Procedure Laterality Date  . foot susrgery    . NASAL SINUS SURGERY    . REVISION TOTAL KNEE ARTHROPLASTY    . TOTAL HIP ARTHROPLASTY     right  . VESICOVAGINAL FISTULA CLOSURE W/ TAH  1978     Current Outpatient Prescriptions  Medication Sig Dispense Refill  . acetaminophen (TYLENOL) 500 MG tablet Take 500 mg by mouth every 6 (six) hours as needed.     . ALPRAZolam (XANAX) 0.25 MG tablet Take 0.25 mg by mouth daily as needed. For anxiety    . aspirin EC 81 MG tablet Take 81 mg by mouth daily.    . Azilsartan-Chlorthalidone 40-25 MG TABS Take 0.5 mg by mouth daily as needed (BP>140).     . famotidine (PEPCID) 20 MG tablet Take 20 mg by mouth daily.    . fluticasone (FLONASE) 50 MCG/ACT nasal spray 2 sprays by Nasal route daily.      Marland Kitchen levothyroxine (SYNTHROID, LEVOTHROID) 137 MCG  tablet Take 137 mcg by mouth daily before breakfast.    . meloxicam (MOBIC) 15 MG tablet Take 15 mg by mouth daily.      Marland Kitchen oxymetazoline (AFRIN) 0.05 % nasal spray Place 2 sprays into the nose daily as needed for congestion.     . rosuvastatin (CRESTOR) 10 MG tablet Take 10 mg by mouth daily.       No current facility-administered medications for this visit.     Allergies:   Macrodantin [nitrofurantoin macrocrystal]    Social History:  The patient  reports that she has never smoked. She has never used smokeless tobacco. She reports that she does not drink alcohol or use drugs.   Family History:  The patient's family history includes Heart disease in her father.    ROS:  Please see the history of present illness.   Otherwise, review of systems are positive for none.   All other systems are reviewed and negative.    PHYSICAL EXAM: VS:  BP 140/90   Pulse 72   Ht 5' (1.524 m)   Wt 63.7 kg (140 lb 6.4 oz)   SpO2 94%   BMI 27.42 kg/m  , BMI Body mass index is 27.42 kg/m.  Affect appropriate Healthy:  appears stated age HEENT: normal Neck supple with no adenopathy JVP normal no bruits no thyromegaly Lungs interstitial crackles at base no wheezing and good diaphragmatic motion Heart:  S1/S2 no murmur, no rub, gallop or click PMI normal Abdomen: benighn, BS positve, no tenderness, no AAA no bruit.  No HSM or HJR Distal pulses intact with no bruits No edema Neuro non-focal Skin warm and dry No muscular weakness    EKG: SR LAFB ? Anterior MI poor R wave progression    Recent Labs: 01/21/2016: ALT 19; BUN 24; Creatinine, Ser 0.80; Hemoglobin 15.6; Platelets 227; Potassium 3.6; Sodium 141    Lipid Panel No results found for: CHOL, TRIG, HDL, CHOLHDL, VLDL, LDLCALC, LDLDIRECT    Wt Readings from Last 3 Encounters:  01/27/16 63.7 kg (140 lb 6.4 oz)  12/23/13 69.9 kg (154 lb)  12/16/13 71.2 kg (157 lb)      Other studies Reviewed: Additional studies/ records that were  reviewed today include: ER notes, lab, CT And notes [facility] .    ASSESSMENT AND PLAN:  1. TIA: concern for recurring events carotids ok in January. Will do echo today as ECG suggests possible old anterior MI r/o SOE. Have discussed with EP doing LINK next Week for possible PAF. Dr [facility] to arrange MRI/MRA  Continue ASA for now 2. Chest pain: atypical no acute ECG changes troponin negative in ER will defer Any stress testing until neuro w/u complete 3. ILD: and dyspnea will facilitate fu with pulmonary Dr Delton CoombesByrum    Current medicines are reviewed at length with the patient today.  The patient does not have concerns regarding medicines.  The following changes have been made:  no change  ECG Echo LINQ Refer to pulmonary for ILD  Disposition:   FU with me next available      Signed, Charlton HawsPeter Allana Shrestha, MD  01/27/2016 11:48 AM    [facility] [address], [place], [place]  [address] Phone: [phone]; Fax: [phone]

## 2016-01-27 NOTE — Patient Instructions (Addendum)
Medication Instructions:  Your physician recommends that you continue on your current medications as directed. Please refer to the Current Medication list given to you today.  Labwork: NONE  Testing/Procedures: Your physician has requested that you have an echocardiogram. Echocardiography is a painless test that uses sound waves to create images of your heart. It provides your doctor with information about the size and shape of your heart and how well your heart's chambers and valves are working. This procedure takes approximately one hour. There are no restrictions for this procedure.  Follow-Up: Your physician wants you to follow-up next available with Dr. Eden EmmsNishan. You will receive a reminder letter in the mail two months in advance. If you don't receive a letter, please call our office to schedule the follow-up appointment.  You have been referred to Dr. Delton CoombesByrum for dyspnea.  You have been referred to EP doctor for Loop Recorder.    If you need a refill on your cardiac medications before your next appointment, please call your pharmacy.

## 2016-01-31 ENCOUNTER — Telehealth (HOSPITAL_COMMUNITY): Payer: Self-pay | Admitting: Nurse Practitioner

## 2016-01-31 NOTE — Telephone Encounter (Signed)
ILR implant rescheduled to Thursday with Dr Johney FrameAllred. Pt to arrive at short stay at 7AM. Pt aware and agrees with plan.   Gypsy BalsamAmber Brittin Belnap, NP 01/31/2016 9:35 AM

## 2016-02-03 ENCOUNTER — Ambulatory Visit (HOSPITAL_COMMUNITY)
Admission: RE | Admit: 2016-02-03 | Discharge: 2016-02-03 | Disposition: A | Discharge status: Home / Self Care | Payer: Medicare Other | Source: Ambulatory Visit | Attending: Internal Medicine | Admitting: Internal Medicine

## 2016-02-03 ENCOUNTER — Encounter (HOSPITAL_COMMUNITY): Payer: Self-pay | Admitting: Internal Medicine

## 2016-02-03 ENCOUNTER — Encounter (HOSPITAL_COMMUNITY)
Admission: RE | Disposition: A | Discharge status: Home / Self Care | Payer: Self-pay | Source: Ambulatory Visit | Attending: Internal Medicine

## 2016-02-03 DIAGNOSIS — Z7982 Long term (current) use of aspirin: Secondary | ICD-10-CM | POA: Diagnosis not present

## 2016-02-03 DIAGNOSIS — Z8249 Family history of ischemic heart disease and other diseases of the circulatory system: Secondary | ICD-10-CM | POA: Diagnosis not present

## 2016-02-03 DIAGNOSIS — I639 Cerebral infarction, unspecified: Secondary | ICD-10-CM | POA: Diagnosis not present

## 2016-02-03 DIAGNOSIS — Z8673 Personal history of transient ischemic attack (TIA), and cerebral infarction without residual deficits: Secondary | ICD-10-CM | POA: Insufficient documentation

## 2016-02-03 DIAGNOSIS — Z96641 Presence of right artificial hip joint: Secondary | ICD-10-CM | POA: Diagnosis not present

## 2016-02-03 DIAGNOSIS — J8410 Pulmonary fibrosis, unspecified: Secondary | ICD-10-CM | POA: Diagnosis not present

## 2016-02-03 DIAGNOSIS — M1990 Unspecified osteoarthritis, unspecified site: Secondary | ICD-10-CM | POA: Insufficient documentation

## 2016-02-03 DIAGNOSIS — E785 Hyperlipidemia, unspecified: Secondary | ICD-10-CM | POA: Diagnosis not present

## 2016-02-03 DIAGNOSIS — K219 Gastro-esophageal reflux disease without esophagitis: Secondary | ICD-10-CM | POA: Insufficient documentation

## 2016-02-03 DIAGNOSIS — I638 Other cerebral infarction: Secondary | ICD-10-CM | POA: Diagnosis not present

## 2016-02-03 DIAGNOSIS — I10 Essential (primary) hypertension: Secondary | ICD-10-CM | POA: Diagnosis not present

## 2016-02-03 HISTORY — PX: EP IMPLANTABLE DEVICE: SHX172B

## 2016-02-03 SURGERY — LOOP RECORDER INSERTION
Anesthesia: LOCAL

## 2016-02-03 MED ORDER — LIDOCAINE-EPINEPHRINE 1 %-1:100000 IJ SOLN
INTRAMUSCULAR | Status: AC
  Filled 2016-02-03: qty 1

## 2016-02-03 MED ORDER — LIDOCAINE-EPINEPHRINE 1 %-1:100000 IJ SOLN
INTRAMUSCULAR | Status: DC | PRN
  Administered 2016-02-03: 10 mL via INTRADERMAL

## 2016-02-03 SURGICAL SUPPLY — 2 items
LOOP REVEAL LINQSYS (Prosthesis & Implant Heart) ×1 IMPLANT
PACK LOOP INSERTION (CUSTOM PROCEDURE TRAY) ×2 IMPLANT

## 2016-02-03 NOTE — Discharge Instructions (Signed)
See extra instruction sheet.

## 2016-02-03 NOTE — H&P (Signed)
H&P    Patient ID: Kayla Wells MRN: [id number], DOB/AGE: Aug 28, 1929 82 y.o.  Admit date: 02/03/2016 Date of visit/procedure: 02/03/2016   Primary Physician: Julian Hy, MD Primary Cardiologist: Dr. Eden Emms  Reason : ILR implant  HPI: Kayla Wells is a 82 y.o. female who presented to Dr. Eden Emms for evaluation of TIA, dyspnea and left arm pain. She has HTN, is a non smoker but has ILD Seen in ER 9/22 with RUE weakness and speech changes. Previous TIA years ago  CT NAD   ECG noted SR poor R wave progression ? Anterior MI and LAFB, all EKGs have been reviewed and are SR  Carotids 05/07/15 Less than 40% bilateral Dx   01/27/16: TTE Study Conclusions - Left ventricle: The cavity size was normal. Wall thickness was   normal. Systolic function was normal. The estimated ejection   fraction was in the range of 55% to 60%. Wall motion was normal;   there were no regional wall motion abnormalities. Doppler   parameters are consistent with abnormal left ventricular   relaxation (grade 1 diastolic dysfunction). - Aortic valve: Trileaflet; mildly thickened, moderately calcified   leaflets. - Aorta: Ascending aortic diameter: 41 mm (S). - Ascending aorta: The ascending aorta was mildly dilated. - Mitral valve: Moderately calcified annulus. There was trivial   regurgitation. Impressions: - No cardiac source of emboli was indentified.   She had c/o left arm pain Dr. Eden Emms felt sounded non anginal occurs at rest.  Dyspnea is chronic and related to ILD She has seen Dr Sherene Sires  Denies palpitations or history of PAF.  No contraindications to anticoagulation    Past Medical History:  Diagnosis Date  . Arthritis   . GERD (gastroesophageal reflux disease)   . Other and unspecified hyperlipidemia   . Pulmonary fibrosis (HCC)   . Unspecified essential hypertension      Surgical History:  Past Surgical History:  Procedure Laterality Date  . foot susrgery    . NASAL SINUS SURGERY     . REVISION TOTAL KNEE ARTHROPLASTY    . TOTAL HIP ARTHROPLASTY     right  . VESICOVAGINAL FISTULA CLOSURE W/ TAH  1978     Prescriptions Prior to Admission  Medication Sig Dispense Refill Last Dose  . acetaminophen (TYLENOL) 500 MG tablet Take 500-1,000 mg by mouth every 6 (six) hours as needed for mild pain (depends on pain).    Taking  . ALPRAZolam (XANAX) 0.25 MG tablet Take 0.25 mg by mouth daily as needed. For anxiety   Taking  . aspirin EC 81 MG tablet Take 81 mg by mouth daily.   Taking  . Azilsartan-Chlorthalidone 40-25 MG TABS Take 0.5 mg by mouth daily as needed (BP>140).    Taking  . B Complex Vitamins (B-COMPLEX/B-12 PO) Take 1 tablet by mouth daily.     . fluticasone (FLONASE) 50 MCG/ACT nasal spray Place 2 sprays into the nose every evening.    Taking  . levothyroxine (SYNTHROID, LEVOTHROID) 137 MCG tablet Take 137 mcg by mouth daily before breakfast.   Taking  . meloxicam (MOBIC) 15 MG tablet Take 15 mg by mouth daily as needed for pain.    Taking  . oxymetazoline (AFRIN) 0.05 % nasal spray Place 2 sprays into the nose every evening.    Taking  . rosuvastatin (CRESTOR) 10 MG tablet Take 10 mg by mouth daily.     Taking    Inpatient Medications:   Allergies:  Allergies  Allergen Reactions  .  Macrodantin [Nitrofurantoin Macrocrystal] Other (See Comments)    Advised by MD not to take this med anymore    Social History   Social History  . Marital status: Married    Spouse name: N/A  . Number of children: N/A  . Years of education: N/A   Occupational History  . retired    Social History Main Topics  . Smoking status: Never Smoker  . Smokeless tobacco: Never Used  . Alcohol use No  . Drug use: No  . Sexual activity: Not on file   Other Topics Concern  . Not on file   Social History Narrative  . No narrative on file     Family History  Problem Relation Age of Onset  . Heart disease Father   . Atopy Neg Hx      Review of Systems: All other systems  reviewed and are otherwise negative except as noted above.  Physical Exam: Vitals:   02/03/16 0721  BP: (!) 153/79  Pulse: 73  Resp: 18  Temp: 97.8 F (36.6 C)  SpO2: 95%  Weight: 140 lb (63.5 kg)  Height: 4\' 11"  (1.499 m)    GEN- The patient is well appearing, alert and oriented x 3 today.   HEENT: normocephalic, atraumatic; sclera clear, conjunctiva pink; hearing intact; oropharynx clear; neck supple, no JVP Lymph- no cervical lymphadenopathy Lungs- Clear to ausculation bilaterally, normal work of breathing.  No wheezes, rales, rhonchi Heart- Regular rate and rhythm, no murmurs, rubs or gallops, PMI not laterally displaced GI- soft, non-tender, non-distended, bowel sounds present Extremities- no clubbing, cyanosis, or edema; DP/PT/radial pulses 2+ bilaterally MS- no significant deformity or atrophy Skin- warm and dry, no rash or lesion Psych- euthymic mood, full affect Neuro- no gross deficits observed  Labs:   Lab Results  Component Value Date   WBC 6.7 01/21/2016   HGB 15.6 (H) 01/21/2016   HCT 46.0 01/21/2016   MCV 90.0 01/21/2016   PLT 227 01/21/2016   No results for input(s): NA, K, CL, CO2, BUN, CREATININE, CALCIUM, PROT, BILITOT, ALKPHOS, ALT, AST, GLUCOSE in the last 168 hours.  Invalid input(s): LABALBU  EKGs reviewed   Assessment and Plan 1. Cryptogenic stroke  The patient has had prior mini strokes and TIAs of unclear etiology.  There is a concern for afib as the cause.  She has been referred by Dr Eden EmmsNishan for implantable loop recorder implantation to monitor for afib long term. The indication for loop recorder insertion / monitoring for AF in setting of cryptogenic stroke was discussed with the patient. The loop recorder insertion procedure was reviewed in detail including risks and benefits. These risks include but are not limited to bleeding and infection. The patient expressed verbal understanding and agrees to proceed. The patient was also counseled  regarding wound care and device follow-up.  Randolm IdolSigned, Janace Decker MD 02/03/2016, 8:04 AM

## 2016-02-15 ENCOUNTER — Other Ambulatory Visit: Payer: Self-pay | Admitting: Endocrinology

## 2016-02-15 DIAGNOSIS — G459 Transient cerebral ischemic attack, unspecified: Secondary | ICD-10-CM

## 2016-02-16 NOTE — Progress Notes (Signed)
Cardiology Office Note   Date:  02/18/2016   ID:  Kayla Wells, DOB 02/20/1930, MRN [id number]  PCP:  Julian HySOUTH,STEPHEN ALAN, MD  Cardiologist:   Charlton HawsPeter Adarrius Graeff, MD   Chief Complaint  Patient presents with  . Other specified transient cerebral ischemias  . Cryptogenic stroke [facility](HCC)      History of Present Illness: Kayla Wells is a 82 y.o. female who was first seen 01/27/16  for evaluation of TIA, dyspnea and left arm pain. She is a non smoker but has ILD Seen in ER 9/22 with RUE weakness and speech changes. Previous TIA years ago  CT NAD   ECG SR poor R wave progression ? Anterior MI and LAFB  Carotids 05/07/15 Less than 40% bilateral Dx   Her left arm pain is non anginal sounding and occurs at rest. Worsening with motion No associated SSCP. Dyspnea is chronic and related to ILD She has seen Dr Sherene SiresWert But does not like him " he has no bedside manner"    Denies palpitations or history of PAF.  No contraindications to anticoagulation  Echo reviewed 01/27/16   Normal EF and no SOE Study Conclusions  - Left ventricle: The cavity size was normal. Wall thickness was   normal. Systolic function was normal. The estimated ejection   fraction was in the range of 55% to 60%. Wall motion was normal;   there were no regional wall motion abnormalities. Doppler   parameters are consistent with abnormal left ventricular   relaxation (grade 1 diastolic dysfunction). - Aortic valve: Trileaflet; mildly thickened, moderately calcified   leaflets. - Aorta: Ascending aortic diameter: 41 mm (S). - Ascending aorta: The ascending aorta was mildly dilated. - Mitral valve: Moderately calcified annulus. There was trivial   regurgitation.  Loop recorder inserted by Dr Johney FrameAllred 02/03/16 MRI/MRA pending ordered by Dr [facility] 03/01/16  Past Medical History:  Diagnosis Date  . Arthritis   . GERD (gastroesophageal reflux disease)   . Other and unspecified hyperlipidemia   . Pulmonary fibrosis (HCC)   .  Unspecified essential hypertension     Past Surgical History:  Procedure Laterality Date  . EP IMPLANTABLE DEVICE N/A 02/03/2016   Procedure: Loop Recorder Insertion;  Surgeon: Hillis RangeJames Allred, MD;  Location: [facility] INVAS[facility];  Service: Cardiovascular;  Laterality: N/A;  . foot susrgery    . NASAL SINUS SURGERY    . REVISION TOTAL KNEE ARTHROPLASTY    . TOTAL HIP ARTHROPLASTY     right  . VESICOVAGINAL FISTULA CLOSURE W/ TAH  1978     Current Outpatient Prescriptions  Medication Sig Dispense Refill  . acetaminophen (TYLENOL) 500 MG tablet Take 500-1,000 mg by mouth every 6 (six) hours as needed for mild pain (depends on pain).     Marland Kitchen. ALPRAZolam (XANAX) 0.25 MG tablet Take 0.25 mg by mouth daily as needed. For anxiety    . aspirin EC 81 MG tablet Take 81 mg by mouth daily.    . Azilsartan-Chlorthalidone 40-25 MG TABS Take 0.5 mg by mouth daily as needed (BP>140).     . B Complex Vitamins (B-COMPLEX/B-12 PO) Take 1 tablet by mouth daily.    . fluticasone (FLONASE) 50 MCG/ACT nasal spray Place 2 sprays into the nose every evening.     Marland Kitchen. levothyroxine (SYNTHROID, LEVOTHROID) 137 MCG tablet Take 137 mcg by mouth daily before breakfast.    . meloxicam (MOBIC) 15 MG tablet Take 15 mg by mouth daily as needed for pain.     .Marland Kitchen  oxymetazoline (AFRIN) 0.05 % nasal spray Place 2 sprays into the nose every evening.     . rosuvastatin (CRESTOR) 10 MG tablet Take 10 mg by mouth daily.       No current facility-administered medications for this visit.     Allergies:   Macrodantin [nitrofurantoin macrocrystal]    Social History:  The patient  reports that she has never smoked. She has never used smokeless tobacco. She reports that she does not drink alcohol or use drugs.   Family History:  The patient's family history includes Heart disease in her father.    ROS:  Please see the history of present illness.   Otherwise, review of systems are positive for none.   All other systems are reviewed and  negative.    PHYSICAL EXAM: VS:  BP 118/72   Pulse 68   Ht 4\' 11"  (1.499 m)   Wt 64 kg (141 lb)   BMI 28.48 kg/m  , BMI Body mass index is 28.48 kg/m. Affect appropriate Healthy:  appears stated age HEENT: normal Neck supple with no adenopathy JVP normal no bruits no thyromegaly Lungs interstitial crackles at base no wheezing and good diaphragmatic motion Heart:  S1/S2 no murmur, no rub, gallop or click PMI normal Abdomen: benighn, BS positve, no tenderness, no AAA no bruit.  No HSM or HJR Distal pulses intact with no bruits No edema Neuro non-focal Skin warm and dry No muscular weakness    EKG: SR LAFB ? Anterior MI poor R wave progression    Recent Labs: 01/21/2016: ALT 19; BUN 24; Creatinine, Ser 0.80; Hemoglobin 15.6; Platelets 227; Potassium 3.6; Sodium 141    Lipid Panel No results found for: CHOL, TRIG, HDL, CHOLHDL, VLDL, LDLCALC, LDLDIRECT    Wt Readings from Last 3 Encounters:  02/18/16 64 kg (141 lb)  02/03/16 63.5 kg (140 lb)  01/27/16 63.7 kg (140 lb 6.4 oz)      Other studies Reviewed: Additional studies/ records that were reviewed today include: ER notes, lab, CT And notes [facility] Dr Evlyn Kanner .    ASSESSMENT AND PLAN:  1. TIA: concern for recurring events carotids ok in January. No loop events to date Echo no SOE MRI/MRA pending   Continue ASA for now 2. Chest pain: atypical no acute ECG changes troponin negative in ER will defer Any stress testing until neuro w/u complete 3. ILD: and dyspnea will facilitate fu with pulmonary Dr Delton Coombes    Current medicines are reviewed at length with the patient today.  The patient does not have concerns regarding medicines.  The following changes have been made:  no change  Charlton Haws

## 2016-02-17 ENCOUNTER — Encounter: Payer: Self-pay | Admitting: Internal Medicine

## 2016-02-17 ENCOUNTER — Ambulatory Visit (INDEPENDENT_AMBULATORY_CARE_PROVIDER_SITE_OTHER): Payer: Medicare Other | Admitting: *Deleted

## 2016-02-17 DIAGNOSIS — I639 Cerebral infarction, unspecified: Secondary | ICD-10-CM

## 2016-02-17 LAB — CUP PACEART INCLINIC DEVICE CHECK: Date Time Interrogation Session: 20171019141428

## 2016-02-17 NOTE — Progress Notes (Signed)
Wound check appointment, s/p ILR implant on 02/03/16. Steri-strips removed. Wound without redness or edema. Incision edges approximated, wound well healed. Battery status: good. R-waves 0.452mV. No symptom, tachy, pause, brady, or AF episodes. Monthly summary reports and ROV with JA PRN.

## 2016-02-18 ENCOUNTER — Ambulatory Visit (INDEPENDENT_AMBULATORY_CARE_PROVIDER_SITE_OTHER): Payer: Medicare Other | Admitting: Cardiovascular Disease

## 2016-02-18 ENCOUNTER — Encounter: Payer: Self-pay | Admitting: Cardiovascular Disease

## 2016-02-18 VITALS — BP 118/72 | HR 68 | Ht 59.0 in | Wt 141.0 lb

## 2016-02-18 DIAGNOSIS — G458 Other transient cerebral ischemic attacks and related syndromes: Secondary | ICD-10-CM

## 2016-02-18 DIAGNOSIS — I639 Cerebral infarction, unspecified: Secondary | ICD-10-CM

## 2016-02-18 NOTE — Patient Instructions (Signed)

## 2016-03-01 ENCOUNTER — Ambulatory Visit
Admission: RE | Admit: 2016-03-01 | Discharge: 2016-03-01 | Disposition: A | Discharge status: Home / Self Care | Payer: Medicare Other | Source: Ambulatory Visit | Attending: Endocrinology | Admitting: Endocrinology

## 2016-03-01 DIAGNOSIS — G459 Transient cerebral ischemic attack, unspecified: Secondary | ICD-10-CM

## 2016-03-01 MED ORDER — GADOBENATE DIMEGLUMINE 529 MG/ML IV SOLN
13.0000 mL | Freq: Once | INTRAVENOUS | Status: AC | PRN
  Administered 2016-03-01: 13 mL via INTRAVENOUS

## 2016-03-06 ENCOUNTER — Encounter: Payer: Self-pay | Admitting: Emergency Medicine

## 2016-03-06 ENCOUNTER — Ambulatory Visit (INDEPENDENT_AMBULATORY_CARE_PROVIDER_SITE_OTHER): Payer: Medicare Other | Admitting: *Deleted

## 2016-03-06 ENCOUNTER — Ambulatory Visit (INDEPENDENT_AMBULATORY_CARE_PROVIDER_SITE_OTHER): Payer: Medicare Other | Admitting: Emergency Medicine

## 2016-03-06 ENCOUNTER — Other Ambulatory Visit: Payer: Medicare Other

## 2016-03-06 VITALS — BP 124/80 | HR 76 | Ht 59.5 in | Wt 142.0 lb

## 2016-03-06 DIAGNOSIS — I639 Cerebral infarction, unspecified: Secondary | ICD-10-CM | POA: Diagnosis not present

## 2016-03-06 DIAGNOSIS — J8410 Pulmonary fibrosis, unspecified: Secondary | ICD-10-CM | POA: Diagnosis not present

## 2016-03-06 DIAGNOSIS — R1310 Dysphagia, unspecified: Secondary | ICD-10-CM

## 2016-03-06 NOTE — Progress Notes (Signed)
Carelink Summary Report / Loop Recorder 

## 2016-03-06 NOTE — Patient Instructions (Addendum)
We will perform a repeat CT scan of your chest, no contrast, high resolution cuts Walking oximetry today shows that your oxygen level drops with exertion. You would quality to wear oxygen with exertion if yo were willing to do so. We can revisit this issue in the future. Please think about whethe ryou would want to do this.  We will perform lab work today We will perform a modified barium swallowing evaluation  Start taking your flonase 2 sprays each nostril a few hours before bedtime.  We may decide to repeat your breathing tests depending on your other testing  Follow with Dr Delton CoombesByrum in 2 months or sooner if you have any problems.

## 2016-03-06 NOTE — Assessment & Plan Note (Signed)
Interstitial lung disease with continued basilar crackles. She desaturated with exertion today. Etiology is unclear, consider Macrodantin, consider chronic aspiration. She needs an autoimmune evaluation. We will consider repeating her pulmonary function testing depending on how the remainder of the evaluation unfolds. I will repeat her CT scan of the chest to quantify the degree of her interstitial disease.

## 2016-03-06 NOTE — Progress Notes (Signed)
Subjective:    Patient ID: Kayla Wells, female    DOB: 10/14/1929, 82 y.o.   MRN: [id number]  HPI  82 year old never smoker with a history of hypertension, GERD, dysphagia, hyperlipidemia CVD with recent TIA. She has been evaluated before for interstitial lung disease noted on CT scan of the chest in December 2010. It was felt that this may be related to nitrofurantoin exposure. She presents today for re-eval of the ILD.   She was dx with ILD in setting pleuritic pain in 2010. She was treated with prednisone at the time and the pain resolved. nitrofurantoin was stopped, fish oil was stopped. Imaging 2012 showed similar ILD on CXR. No other intervention was made. PFT in 2012 showed evidence for restriction and decrease DLCO.   She has chronic nasal congestion, daily cough that is non-productive. She has had labile BP, TIA's. States that she has slow progressive exertional SOB. Uses flonase in the evenings. Has to rest after doing her house work. Has been worse over the last 2-3 years. She has dysphasia when she eats and drinks, especially when not sitting up straight. Happens a few times a month.     Review of Systems  Constitutional: Positive for unexpected weight change. Negative for fever.  HENT: Positive for congestion, dental problem and trouble swallowing. Negative for ear pain, nosebleeds, postnasal drip, rhinorrhea, sinus pressure, sneezing and sore throat.   Eyes: Negative for redness and itching.  Respiratory: Positive for cough and shortness of breath. Negative for chest tightness and wheezing.   Cardiovascular: Negative for palpitations and leg swelling.  Gastrointestinal: Negative for nausea and vomiting.  Genitourinary: Negative for dysuria.  Musculoskeletal: Negative for joint swelling.  Skin: Negative for rash.  Neurological: Negative for headaches.  Hematological: Does not bruise/bleed easily.  Psychiatric/Behavioral: Negative for dysphoric mood. The patient is  nervous/anxious.     Past Medical History:  Diagnosis Date  . Arthritis   . GERD (gastroesophageal reflux disease)   . Other and unspecified hyperlipidemia   . Pulmonary fibrosis (HCC)   . Unspecified essential hypertension      Family History  Problem Relation Age of Onset  . Heart disease Father   . Atopy Neg Hx      Social History   Social History  . Marital status: Married    Spouse name: N/A  . Number of children: N/A  . Years of education: N/A   Occupational History  . retired    Social History Main Topics  . Smoking status: Never Smoker  . Smokeless tobacco: Never Used  . Alcohol use No  . Drug use: No  . Sexual activity: Not on file   Other Topics Concern  . Not on file   Social History Narrative  . No narrative on file     Allergies  Allergen Reactions  . Macrodantin [Nitrofurantoin Macrocrystal] Other (See Comments)    Advised by MD not to take this med anymore     Outpatient Medications Prior to Visit  Medication Sig Dispense Refill  . acetaminophen (TYLENOL) 500 MG tablet Take 500-1,000 mg by mouth every 6 (six) hours as needed for mild pain (depends on pain).     Marland Kitchen. ALPRAZolam (XANAX) 0.25 MG tablet Take 0.25 mg by mouth daily as needed. For anxiety    . aspirin EC 81 MG tablet Take 81 mg by mouth daily.    . Azilsartan-Chlorthalidone 40-25 MG TABS Take 0.5 mg by mouth daily as needed (BP>140).     .Marland Kitchen  B Complex Vitamins (B-COMPLEX/B-12 PO) Take 1 tablet by mouth daily.    . fluticasone (FLONASE) 50 MCG/ACT nasal spray Place 2 sprays into the nose every evening.     Marland Kitchen. levothyroxine (SYNTHROID, LEVOTHROID) 137 MCG tablet Take 137 mcg by mouth daily before breakfast.    . meloxicam (MOBIC) 15 MG tablet Take 15 mg by mouth daily as needed for pain.     Marland Kitchen. oxymetazoline (AFRIN) 0.05 % nasal spray Place 2 sprays into the nose every evening.     . rosuvastatin (CRESTOR) 10 MG tablet Take 10 mg by mouth daily.       No facility-administered medications  prior to visit.         Objective:   Physical Exam Vitals:   03/06/16 1126 03/06/16 1127  BP:  124/80  Pulse:  76  SpO2:  96%  Weight: 142 lb (64.4 kg)   Height: 4' 11.5" (1.511 m)    Gen: Pleasant, well-nourished, in no distress,  normal affect  ENT: No lesions,  mouth clear,  oropharynx clear, no postnasal drip  Neck: No JVD, no TMG, no carotid bruits  Lungs: No use of accessory muscles,bibasiular insp crackles  Cardiovascular: RRR, heart sounds normal, no murmur or gallops, no peripheral edema  Musculoskeletal: No deformities, no cyanosis or clubbing  Neuro: alert, non focal  Skin: Warm, no lesions or rashes      Assessment & Plan:  PULMONARY FIBROSIS ILD POST INFLAMMATORY CHRONIC Interstitial lung disease with continued basilar crackles. She desaturated with exertion today. Etiology is unclear, consider Macrodantin, consider chronic aspiration. She needs an autoimmune evaluation. We will consider repeating her pulmonary function testing depending on how the remainder of the evaluation unfolds. I will repeat her CT scan of the chest to quantify the degree of her interstitial disease.  Levy Pupaobert Rosario Duey, MD, PhD 03/06/2016, 11:57 AM [facility] [phone] or if no answer [phone]

## 2016-03-07 ENCOUNTER — Telehealth: Payer: Self-pay | Admitting: Emergency Medicine

## 2016-03-07 ENCOUNTER — Other Ambulatory Visit (HOSPITAL_COMMUNITY): Payer: Self-pay | Admitting: Emergency Medicine

## 2016-03-07 DIAGNOSIS — J8410 Pulmonary fibrosis, unspecified: Secondary | ICD-10-CM

## 2016-03-07 DIAGNOSIS — R1319 Other dysphagia: Secondary | ICD-10-CM

## 2016-03-07 LAB — ANTI-SCLERODERMA ANTIBODY: SCLERODERMA (SCL-70) (ENA) ANTIBODY, IGG: NEGATIVE

## 2016-03-07 LAB — SJOGREN'S SYNDROME ANTIBODS(SSA + SSB)
SSA (Ro) (ENA) Antibody, IgG: <1
SSB (La) (ENA) Antibody, IgG: <1

## 2016-03-07 LAB — ANTI-DNA ANTIBODY, DOUBLE-STRANDED: DS DNA AB: 6 [IU]/mL — AB

## 2016-03-07 LAB — ANTI-SMITH ANTIBODY: ENA SM Ab Ser-aCnc: <1

## 2016-03-07 LAB — RHEUMATOID FACTOR: Rhuematoid fact SerPl-aCnc: <14 IU/mL (ref <14)

## 2016-03-07 LAB — ANA: Anti Nuclear Antibody(ANA): POSITIVE — AB

## 2016-03-07 LAB — CYCLIC CITRUL PEPTIDE ANTIBODY, IGG

## 2016-03-07 LAB — ANTI-NUCLEAR AB-TITER (ANA TITER): ANA Titer 1: 1:160 {titer} — ABNORMAL HIGH

## 2016-03-07 LAB — RNP ANTIBODY: Ribonucleic Protein(ENA) Antibody, IgG: <1

## 2016-03-07 LAB — ANTI-JO 1 ANTIBODY, IGG: Anti JO-1: <0.2 AI (ref 0.0–0.9)

## 2016-03-07 NOTE — Telephone Encounter (Signed)
Spoke with pt, states she has decided that she would like to start on 02.  Pt was walked in office yesterday, qualified for 2lpm with exertion.    RB please advise if pt needs nocturnal 02 also, or just with exertion.  Thanks!

## 2016-03-08 LAB — ALDOLASE: ALDOLASE: 6.8 U/L (ref <=8.1)

## 2016-03-08 NOTE — Telephone Encounter (Signed)
Start 2L/min with exertion with a POC.   Also order an ONO on RA so we can determine if she needs at night.

## 2016-03-08 NOTE — Telephone Encounter (Signed)
Pt aware of below message. Orders placed. Pt voiced understanding & had no further questions. Nothing further needed.

## 2016-03-10 ENCOUNTER — Ambulatory Visit (INDEPENDENT_AMBULATORY_CARE_PROVIDER_SITE_OTHER)
Admission: RE | Admit: 2016-03-10 | Discharge: 2016-03-10 | Disposition: A | Discharge status: Home / Self Care | Payer: Medicare Other | Source: Ambulatory Visit | Attending: Emergency Medicine | Admitting: Emergency Medicine

## 2016-03-10 DIAGNOSIS — J8410 Pulmonary fibrosis, unspecified: Secondary | ICD-10-CM

## 2016-03-13 ENCOUNTER — Other Ambulatory Visit: Payer: Medicare Other

## 2016-03-14 ENCOUNTER — Ambulatory Visit (HOSPITAL_COMMUNITY)
Admission: RE | Admit: 2016-03-14 | Discharge: 2016-03-14 | Disposition: A | Discharge status: Home / Self Care | Payer: Medicare Other | Source: Ambulatory Visit | Attending: Emergency Medicine | Admitting: Emergency Medicine

## 2016-03-14 DIAGNOSIS — R1310 Dysphagia, unspecified: Secondary | ICD-10-CM | POA: Insufficient documentation

## 2016-03-14 DIAGNOSIS — J8410 Pulmonary fibrosis, unspecified: Secondary | ICD-10-CM | POA: Insufficient documentation

## 2016-03-14 DIAGNOSIS — R1319 Other dysphagia: Secondary | ICD-10-CM

## 2016-03-14 DIAGNOSIS — I10 Essential (primary) hypertension: Secondary | ICD-10-CM | POA: Insufficient documentation

## 2016-03-14 DIAGNOSIS — K219 Gastro-esophageal reflux disease without esophagitis: Secondary | ICD-10-CM | POA: Insufficient documentation

## 2016-03-14 DIAGNOSIS — M1990 Unspecified osteoarthritis, unspecified site: Secondary | ICD-10-CM | POA: Diagnosis not present

## 2016-03-14 DIAGNOSIS — K228 Other specified diseases of esophagus: Secondary | ICD-10-CM | POA: Insufficient documentation

## 2016-03-14 DIAGNOSIS — E785 Hyperlipidemia, unspecified: Secondary | ICD-10-CM | POA: Diagnosis not present

## 2016-04-03 ENCOUNTER — Ambulatory Visit (INDEPENDENT_AMBULATORY_CARE_PROVIDER_SITE_OTHER): Payer: Medicare Other | Admitting: *Deleted

## 2016-04-03 DIAGNOSIS — I639 Cerebral infarction, unspecified: Secondary | ICD-10-CM

## 2016-04-03 NOTE — Progress Notes (Signed)
Carelink Summary Report / Loop Recorder 

## 2016-04-07 ENCOUNTER — Encounter: Payer: Self-pay | Admitting: Internal Medicine

## 2016-04-21 LAB — CUP PACEART REMOTE DEVICE CHECK
MDC IDC PG IMPLANT DT: 20171005
MDC IDC SESS DTM: 20171104113545

## 2016-04-21 NOTE — Progress Notes (Signed)
Carelink summary report received. Battery status OK. Normal device function. No new symptom episodes, tachy episodes, brady, or pause episodes. No new AF episodes. Monthly summary reports and ROV/PRN 

## 2016-05-03 ENCOUNTER — Ambulatory Visit (INDEPENDENT_AMBULATORY_CARE_PROVIDER_SITE_OTHER): Payer: Medicare Other | Admitting: *Deleted

## 2016-05-03 DIAGNOSIS — I639 Cerebral infarction, unspecified: Secondary | ICD-10-CM | POA: Diagnosis not present

## 2016-05-04 ENCOUNTER — Telehealth: Payer: Self-pay | Admitting: Cardiovascular Disease

## 2016-05-04 NOTE — Telephone Encounter (Signed)
°  New Prob   Pt has some questions regarding her loop recorder. Please call.

## 2016-05-04 NOTE — Progress Notes (Signed)
Carelink Summary Report / Loop Recorder 

## 2016-05-04 NOTE — Telephone Encounter (Signed)
Mrs. Kayla Wells is not at home at night and is spending most of her time with her husband at rehab. She was concerned that we had not been receiving transmissions from her loop recorder. I have advised her to send weekly manual transmissions when she is home until her husband is back at home. She verbalizes understanding and is agreeable.

## 2016-05-09 ENCOUNTER — Ambulatory Visit: Payer: Medicare Other | Admitting: Emergency Medicine

## 2016-05-10 ENCOUNTER — Ambulatory Visit (INDEPENDENT_AMBULATORY_CARE_PROVIDER_SITE_OTHER): Payer: Medicare Other | Admitting: Diagnostic Neuroimaging

## 2016-05-10 ENCOUNTER — Encounter: Payer: Self-pay | Admitting: Diagnostic Neuroimaging

## 2016-05-10 VITALS — BP 172/87 | HR 71 | Ht 59.0 in | Wt 145.0 lb

## 2016-05-10 DIAGNOSIS — M4802 Spinal stenosis, cervical region: Secondary | ICD-10-CM | POA: Diagnosis not present

## 2016-05-10 DIAGNOSIS — G039 Meningitis, unspecified: Secondary | ICD-10-CM | POA: Diagnosis not present

## 2016-05-10 DIAGNOSIS — R9089 Other abnormal findings on diagnostic imaging of central nervous system: Secondary | ICD-10-CM

## 2016-05-10 NOTE — Progress Notes (Signed)
[facility]  PATIENT: Kayla Wells DOB: 1930-04-22  REFERRING CLINICIAN: S [place] HISTORY FROM: patient  REASON FOR VISIT: new consult    HISTORICAL  CHIEF COMPLAINT:  Chief Complaint  Patient presents with  . Abnormal MRI brain    rm 7, New Pt, son- Tim    HISTORY OF PRESENT ILLNESS:   83 year old right-handed female here for evaluation of abnormal MRI and gait/balance difficulty. Patient has history of mini strokes, arm surgery, hip replacement, foot surgery, hypercholesteremia, anxiety, fibromyalgia.   Summer 2017 patient fell twice from her swing at home, hitting her head on concrete/iron railing. No loss of consciousness. She had swelling and pain on her back of her head. She did not seek medical attention for these falls/head injuries.  September 2017 patient had 5 minute episode of right arm weakness and slurred speech. Later that month she had another episode of right arm weakness and slurred speech lasting for one hour. Patient went to the hospital for evaluation on 01/21/16 for TIA/stroke workup. Patient opted to go home instead of staying for workup at the hospital.  Patient followed up with PCP who ordered MRI scan of the brain. Patient was found to have diffuse pachymeningeal enhancement, but no other acute findings. No significant intracranial or extra cranial stenosis was noted. However possible C3-4 spinal stenosis was noted on sagittal MRA head views.  Since that time patient continues that have balance and gait difficulty, tripping and falling. She denies any significant headache, neck pain, vision changes or slurred speech. No positional headache changes or problems. No stable and dizziness.   REVIEW OF SYSTEMS: Full 14 system review of systems performed and negative with exception of: Fatigue ringing in ears itching incontinence constipation shortness of breath cramps anxiety decreased energy slurred speech weakness restless  legs.  ALLERGIES: Allergies  Allergen Reactions  . Macrodantin [Nitrofurantoin Macrocrystal] Other (See Comments)    Advised by MD not to take this med anymore    HOME MEDICATIONS: Outpatient Medications Prior to Visit  Medication Sig Dispense Refill  . acetaminophen (TYLENOL) 500 MG tablet Take 500-1,000 mg by mouth every 6 (six) hours as needed for mild pain (depends on pain).     Marland Kitchen ALPRAZolam (XANAX) 0.25 MG tablet Take 0.25 mg by mouth daily as needed. For anxiety    . aspirin EC 81 MG tablet Take 81 mg by mouth daily.    . Azilsartan-Chlorthalidone 40-25 MG TABS Take 0.5 mg by mouth daily as needed (BP>140).     . B Complex Vitamins (B-COMPLEX/B-12 PO) Take 1 tablet by mouth daily.    . fluticasone (FLONASE) 50 MCG/ACT nasal spray Place 2 sprays into the nose every evening.     Marland Kitchen levothyroxine (SYNTHROID, LEVOTHROID) 137 MCG tablet Take 137 mcg by mouth daily before breakfast.    . oxymetazoline (AFRIN) 0.05 % nasal spray Place 2 sprays into the nose every evening.     . rosuvastatin (CRESTOR) 10 MG tablet Take 10 mg by mouth daily.      . meloxicam (MOBIC) 15 MG tablet Take 15 mg by mouth daily as needed for pain.      No facility-administered medications prior to visit.     PAST MEDICAL HISTORY: Past Medical History:  Diagnosis Date  . Arthritis   . GERD (gastroesophageal reflux disease)   . Other and unspecified hyperlipidemia   . Pulmonary fibrosis (HCC)   . Unspecified essential hypertension     PAST SURGICAL HISTORY: Past Surgical History:  Procedure Laterality Date  . ABDOMINAL HYSTERECTOMY  1978  . EP IMPLANTABLE DEVICE N/A 02/03/2016   Procedure: Loop Recorder Insertion;  Surgeon: Hillis RangeJames Allred, MD;  Location: [facility]VASIVE [facility];  Service: Cardiovascular;  Laterality: N/A;  . foot susrgery    . NASAL SINUS SURGERY    . REVISION TOTAL KNEE ARTHROPLASTY    . rotator cuff surgery Left 2014  . TOTAL HIP ARTHROPLASTY     right  . VESICOVAGINAL FISTULA CLOSURE W/  TAH  1978    FAMILY HISTORY: Family History  Problem Relation Age of Onset  . Heart disease Father   . Stroke Father   . Atopy Neg Hx     SOCIAL HISTORY:  Social History   Social History  . Marital status: Married    Spouse name: Max  . Number of children: 2  . Years of education: 14   Occupational History  . retired    Social History Main Topics  . Smoking status: Never Smoker  . Smokeless tobacco: Never Used  . Alcohol use No  . Drug use: No  . Sexual activity: Not on file   Other Topics Concern  . Not on file   Social History Narrative   Lives with husband   Caffeine- coffee, tea     PHYSICAL EXAM  GENERAL EXAM/CONSTITUTIONAL: Vitals:  Vitals:   05/10/16 1059  BP: (!) 172/87  Pulse: 71  Weight: 145 lb (65.8 kg)  Height: 4\' 11"  (1.499 m)     Body mass index is 29.29 kg/m.  Visual Acuity Screening   Right eye Left eye Both eyes  Without correction:     With correction: 20/50 20/50      Patient is in no distress; well developed, nourished and groomed; neck is supple  CARDIOVASCULAR:  Examination of carotid arteries is normal; no carotid bruits  Regular rate and rhythm, no murmurs  Examination of peripheral vascular system by observation and palpation is normal  EYES:  Ophthalmoscopic exam of optic discs and posterior segments is normal; no papilledema or hemorrhages  MUSCULOSKELETAL:  Gait, strength, tone, movements noted in Neurologic exam below  NEUROLOGIC: MENTAL STATUS:  No flowsheet data found.  awake, alert, oriented to person, place and time  recent and remote memory intact  normal attention and concentration  language fluent, comprehension intact, naming intact,   fund of knowledge appropriate  CRANIAL NERVE:   2nd - no papilledema on fundoscopic exam  2nd, 3rd, 4th, 6th - pupils equal and reactive to light, visual fields full to confrontation, extraocular muscles intact, no nystagmus  5th - facial sensation  symmetric  7th - facial strength symmetric  8th - hearing intact  9th - palate elevates symmetrically, uvula midline  11th - shoulder shrug symmetric  12th - tongue protrusion midline  MOTOR:   normal bulk and tone, full strength in the BUE, BLE  EXCEPT BILATERAL HIP FLEXION 4  SENSORY:   normal and symmetric to light touch, temperature, vibration  COORDINATION:   finger-nose-finger, fine finger movements normal  REFLEXES:   deep tendon reflexes TRACE and symmetric  GAIT/STATION:   narrow based gait; SLOW TO RISE; UNSTEADY; STIFF GAIT; USES CANE    DIAGNOSTIC DATA (LABS, IMAGING, TESTING) - I reviewed patient records, labs, notes, testing and imaging myself where available.  Lab Results  Component Value Date   WBC 6.7 01/21/2016   HGB 15.6 (H) 01/21/2016   HCT 46.0 01/21/2016   MCV 90.0 01/21/2016   PLT 227 01/21/2016  Component Value Date/Time   NA 141 01/21/2016 1245   K 3.6 01/21/2016 1245   CL 103 01/21/2016 1245   CO2 25 01/21/2016 1220   GLUCOSE 96 01/21/2016 1245   BUN 24 (H) 01/21/2016 1245   CREATININE 0.80 01/21/2016 1245   CALCIUM 9.7 01/21/2016 1220   PROT 7.2 01/21/2016 1220   ALBUMIN 3.9 01/21/2016 1220   AST 26 01/21/2016 1220   ALT 19 01/21/2016 1220   ALKPHOS 35 (L) 01/21/2016 1220   BILITOT 0.6 01/21/2016 1220   GFRNONAA >60 01/21/2016 1220   GFRAA >60 01/21/2016 1220   No results found for: CHOL, HDL, LDLCALC, LDLDIRECT, TRIG, CHOLHDL No results found for: ZOXW9U No results found for: VITAMINB12 No results found for: TSH   03/01/16 MRI brain [I reviewed images myself and agree with interpretation. -VRP]  - Symmetric pachymeningeal enhancement, greatest anteriorly. Intracranial hypotension is probably the most likely cause, although this appearance can be idiopathic. More concerning abnormalities include inflammatory or granulomatous processes such as rheumatoid arthritis or sarcoidosis, chronic infection, or neoplasia. The  latter are not favored. - No abnormal parenchymal enhancement or evidence for acute stroke/hemorrhage. - Mild atrophy and small vessel disease. - Suspected significant spinal stenosis at C3-4, related to central extrusion and anterolisthesis. Correlate clinically for myelopathy.  03/01/16 MRA head/neck [I reviewed images myself and agree with interpretation. -VRP]  - No intracranial or extracranial flow reducing stenosis of significance.  03/10/16 CT chest  1. Fibrotic interstitial lung disease characterized by patchy confluent subpleural reticulation, traction bronchiolectasis, mild architectural distortion and bibasilar honeycombing, with a slight basilar gradient. Interval progression of findings since 04/07/2009 chest CT. High-resolution CT findings are most consistent with usual interstitial pneumonia (UIP), although the rate of progression is somewhat slow. 2. Mild cardiomegaly. Dilated main pulmonary artery (3.5 cm), increased, suggesting pulmonary arterial hypertension . 3. Aortic atherosclerosis.  Three-vessel coronary atherosclerosis.  4. Nonspecific focal coarse mural calcification anteriorly in the lower thoracic esophagus near the esophagogastric junction, increased. Consider correlation with upper endoscopy as clinically warranted.  01/27/16 TTE - No cardiac source of emboli was indentified.     ASSESSMENT AND PLAN  83 y.o. year old female here with new onset gait and balance difficulty, falls, with transient right arm weakness/numbness and slurred speech 2. May have represented transient ischemic attacks.  Also found to have nonspecific dural enhancement, likely an incidental finding, but could be related to history of head traumas in the past and mild CSF leak. No significant clinical symptoms to correlate with intracranial hypotension or dural enhancement at this time. Will repeat MRI brain to follow this up.  Also found to have cervical spinal stenosis on MRI brain. Will  follow this up with MRI cervical spine to rule out and evaluate for cervical myelopathy.  Ddx:  1. Abnormal finding on MRI of brain   2. Pachymeningitis   3. Spinal stenosis in cervical region      PLAN: - repeat MRI brain w/wo to follow up dural enhancement (likely intracranial hypotension; possibly post-traumatic); then may consider additional testing such as spinal tap and lab work - check MRI cervical spine (follow up cervical spinal stenosis) - use rollator walker; caution with balance and walking and fall precautions  Orders Placed This Encounter  Procedures  . For home use only DME 4 wheeled rolling walker with seat  . MR BRAIN W WO CONTRAST  . MR CERVICAL SPINE WO CONTRAST   Return in about 2 months (around 07/08/2016).  I reviewed images,  labs, notes, records myself. I summarized findings and reviewed with patient, for this high risk condition (abnl MRI brain; cervical spinal stenosis) requiring high complexity decision making.    Suanne Marker, MD 05/10/2016, 11:55 AM Certified in Neurology, Neurophysiology and Neuroimaging  [facility] [address], [address] [place], [place] [address] [phone]

## 2016-05-10 NOTE — Patient Instructions (Signed)
Thank you for coming to see [place] at [facility]. I hope we have been able to provide you high quality care today.  You may receive a patient satisfaction survey over the next few weeks. We would appreciate your feedback and comments so that we may continue to improve ourselves and the health of our patients.  - I will check MRI brain and cervical spine   - use rollator walker   ~~~~~~~~~~~~~~~~~~~~~~~~~~~~~~~~~~~~~~~~~~~~~~~~~~~~~~~~~~~~~~~~~  DR. Taeshaun Rames'S GUIDE TO HAPPY AND HEALTHY LIVING These are some of my general health and wellness recommendations. Some of them may apply to you better than others. Please use common sense as you try these suggestions and feel free to ask me any questions.   ACTIVITY/FITNESS Mental, social, emotional and physical stimulation are very important for brain and body health. Try learning a new activity (arts, music, language, sports, games).  Keep moving your body to the best of your abilities. You can do this at home, inside or outside, the park, community center, gym or anywhere you like. Consider a physical therapist or personal trainer to get started. Consider the app Sworkit. Fitness trackers such as smart-watches, smart-phones or Fitbits can help as well.   NUTRITION Eat more plants: colorful vegetables, nuts, seeds and berries.  Eat less sugar, salt, preservatives and processed foods.  Avoid toxins such as cigarettes and alcohol.  Drink water when you are thirsty. Warm water with a slice of lemon is an excellent morning drink to start the day.  Consider these websites for more information The Nutrition Source (https://www.henry-hernandez.biz/) Precision Nutrition (WindowBlog.ch)   RELAXATION Consider practicing mindfulness meditation or other relaxation techniques such as deep breathing, prayer, yoga, tai chi, massage. See website mindful.org or the apps Headspace or Calm to help  get started.   SLEEP Try to get at least 7-8+ hours sleep per day. Regular exercise and reduced caffeine will help you sleep better. Practice good sleep hygeine techniques. See website sleep.org for more information.   PLANNING Prepare estate planning, living will, healthcare POA documents. Sometimes this is best planned with the help of an attorney. Theconversationproject.org and agingwithdignity.org are excellent resources.

## 2016-05-19 LAB — CUP PACEART REMOTE DEVICE CHECK
Date Time Interrogation Session: 20171204123529
MDC IDC PG IMPLANT DT: 20171005

## 2016-05-19 NOTE — Progress Notes (Signed)
Carelink summary report received. Battery status OK. Normal device function. No new symptom episodes, tachy episodes, brady, or pause episodes. No new AF episodes. Monthly summary reports and ROV/PRN 

## 2016-05-22 ENCOUNTER — Ambulatory Visit: Payer: Medicare Other | Admitting: Emergency Medicine

## 2016-05-31 ENCOUNTER — Ambulatory Visit: Payer: Medicare Other | Admitting: Internal Medicine

## 2016-05-31 ENCOUNTER — Ambulatory Visit
Admission: RE | Admit: 2016-05-31 | Discharge: 2016-05-31 | Disposition: A | Discharge status: Home / Self Care | Payer: Medicare Other | Source: Ambulatory Visit | Attending: Diagnostic Neuroimaging | Admitting: Diagnostic Neuroimaging

## 2016-05-31 DIAGNOSIS — G039 Meningitis, unspecified: Secondary | ICD-10-CM

## 2016-05-31 DIAGNOSIS — R9089 Other abnormal findings on diagnostic imaging of central nervous system: Secondary | ICD-10-CM | POA: Diagnosis not present

## 2016-05-31 DIAGNOSIS — M4802 Spinal stenosis, cervical region: Secondary | ICD-10-CM

## 2016-05-31 MED ORDER — GADOBENATE DIMEGLUMINE 529 MG/ML IV SOLN
13.0000 mL | Freq: Once | INTRAVENOUS | Status: AC | PRN
  Administered 2016-05-31: 13 mL via INTRAVENOUS

## 2016-06-01 ENCOUNTER — Encounter: Payer: Self-pay | Admitting: Emergency Medicine

## 2016-06-01 ENCOUNTER — Ambulatory Visit (INDEPENDENT_AMBULATORY_CARE_PROVIDER_SITE_OTHER): Payer: Medicare Other | Admitting: Emergency Medicine

## 2016-06-01 DIAGNOSIS — J8410 Pulmonary fibrosis, unspecified: Secondary | ICD-10-CM | POA: Diagnosis not present

## 2016-06-01 NOTE — Assessment & Plan Note (Signed)
We tried her on supplement oxygen but she was not interested in keeping it because she did not want concentrator with a long hose. She likely desaturate with exertion. Her interstitial disease has progressed based on her most recent CT scan of the chest. Her ANA is positive. I discussed referral to rheumatology she wanted to defer for now. We are being conservative w management at this time although I am concerned that low oxygen levels could be contributing to her TIAs. Revisit this at her next visit

## 2016-06-01 NOTE — Progress Notes (Signed)
Subjective:    Patient ID: Kayla Wells, female    DOB: 03/28/1930, 83 y.o.   MRN: [id number]  HPI  83 year old never smoker with a history of hypertension, GERD, dysphagia, hyperlipidemia CVD with recent TIA. She has been evaluated before for interstitial lung disease noted on CT scan of the chest in December 2010. It was felt that this may be related to nitrofurantoin exposure. She presents today for re-eval of the ILD.   She was dx with ILD in setting pleuritic pain in 2010. She was treated with prednisone at the time and the pain resolved. nitrofurantoin was stopped, fish oil was stopped. Imaging 2012 showed similar ILD on CXR. No other intervention was made. PFT in 2012 showed evidence for restriction and decrease DLCO.   She has chronic nasal congestion, daily cough that is non-productive. She has had labile BP, TIA's. States that she has slow progressive exertional SOB. Uses flonase in the evenings. Has to rest after doing her house work. Has been worse over the last 2-3 years. She has dysphasia when she eats and drinks, especially when not sitting up straight. Happens a few times a month.   ROV 06/01/16 -- This follow-up visit for her sister lung disease. She has been on nitrofurantoin in the past, also a hs dysphagia and probably chronic aspiration. Repeat Ct scan chest done repeat CT scan of her chest done 03/10/16 was reviewed personally. This showed slight progression of her interstitial changes a UIP pattern compared with 2010. Swallowing evaluation done 03/15/16 did not show any confirmed aspiration or penetration. Her autoimmune evaluation shows ANA positive, ratio 1:160, homogeneous pattern. All of her other autoimmune labs were negative. She was started on home O2, did not keep it because she was worried that the cord would be a tripping hazard. Her breathing has not changed. She is under eval for TIA's with neurology   Review of Systems  Constitutional: Positive for unexpected  weight change. Negative for fever.  HENT: Positive for congestion, dental problem and trouble swallowing. Negative for ear pain, nosebleeds, postnasal drip, rhinorrhea, sinus pressure, sneezing and sore throat.   Eyes: Negative for redness and itching.  Respiratory: Positive for cough and shortness of breath. Negative for chest tightness and wheezing.   Cardiovascular: Negative for palpitations and leg swelling.  Gastrointestinal: Negative for nausea and vomiting.  Genitourinary: Negative for dysuria.  Musculoskeletal: Negative for joint swelling.  Skin: Negative for rash.  Neurological: Negative for headaches.  Hematological: Does not bruise/bleed easily.  Psychiatric/Behavioral: Negative for dysphoric mood. The patient is nervous/anxious.     Past Medical History:  Diagnosis Date  . Arthritis   . GERD (gastroesophageal reflux disease)   . Other and unspecified hyperlipidemia   . Pulmonary fibrosis (HCC)   . Unspecified essential hypertension      Family History  Problem Relation Age of Onset  . Heart disease Father   . Stroke Father   . Atopy Neg Hx      Social History   Social History  . Marital status: Married    Spouse name: Max  . Number of children: 2  . Years of education: 14   Occupational History  . retired    Social History Main Topics  . Smoking status: Never Smoker  . Smokeless tobacco: Never Used  . Alcohol use No  . Drug use: No  . Sexual activity: Not on file   Other Topics Concern  . Not on file   Social History Narrative  Lives with husband   Caffeine- coffee, tea     Allergies  Allergen Reactions  . Macrodantin [Nitrofurantoin Macrocrystal] Other (See Comments)    Advised by MD not to take this med anymore     Outpatient Medications Prior to Visit  Medication Sig Dispense Refill  . acetaminophen (TYLENOL) 500 MG tablet Take 500-1,000 mg by mouth every 6 (six) hours as needed for mild pain (depends on pain).     Marland Kitchen ALPRAZolam (XANAX)  0.25 MG tablet Take 0.25 mg by mouth daily as needed. For anxiety    . aspirin EC 81 MG tablet Take 81 mg by mouth daily.    . Azilsartan-Chlorthalidone 40-25 MG TABS Take 0.5 mg by mouth daily as needed (BP>140).     . B Complex Vitamins (B-COMPLEX/B-12 PO) Take 1 tablet by mouth daily.    . fluticasone (FLONASE) 50 MCG/ACT nasal spray Place 2 sprays into the nose every evening.     Marland Kitchen levothyroxine (SYNTHROID, LEVOTHROID) 137 MCG tablet Take 137 mcg by mouth daily before breakfast.    . oxymetazoline (AFRIN) 0.05 % nasal spray Place 2 sprays into the nose every evening.     . rosuvastatin (CRESTOR) 10 MG tablet Take 10 mg by mouth daily.      . traMADol (ULTRAM) 50 MG tablet Take by mouth every 6 (six) hours as needed.     No facility-administered medications prior to visit.         Objective:   Physical Exam Vitals:   06/01/16 1048  BP: (!) 160/100  Pulse: 75  SpO2: 97%  Weight: 142 lb 3.2 oz (64.5 kg)  Height: 4\' 11"  (1.499 m)   Gen: Pleasant, well-nourished, in no distress,  normal affect  ENT: No lesions,  mouth clear,  oropharynx clear, no postnasal drip  Neck: No JVD, no TMG, no carotid bruits  Lungs: No use of accessory muscles,bibasiular insp crackles  Cardiovascular: RRR, heart sounds normal, no murmur or gallops, no peripheral edema  Musculoskeletal: No deformities, no cyanosis or clubbing  Neuro: alert, non focal  Skin: Warm, no lesions or rashes      Assessment & Plan:  PULMONARY FIBROSIS ILD POST INFLAMMATORY CHRONIC We tried her on supplement oxygen but she was not interested in keeping it because she did not want concentrator with a long hose. She likely desaturate with exertion. Her interstitial disease has progressed based on her most recent CT scan of the chest. Her ANA is positive. I discussed referral to rheumatology she wanted to defer for now. We are being conservative w management at this time although I am concerned that low oxygen levels could be  contributing to her TIAs. Revisit this at her next visit  Levy Pupa, MD, PhD 06/01/2016, 11:19 AM [facility] [phone] or if no answer [phone]

## 2016-06-01 NOTE — Patient Instructions (Addendum)
We will send you to be evaluated by rheumatology for inflammation in your joints and lungs when you are ready to do so. Hold off for now. Your ANA labwork was positive.  We will order a smaller more portable oxygen system for you if / when you are interested in getting it.  Follow with Dr Delton CoombesByrum in 6 months or sooner if you have any problems

## 2016-06-02 ENCOUNTER — Ambulatory Visit (INDEPENDENT_AMBULATORY_CARE_PROVIDER_SITE_OTHER): Payer: Medicare Other | Admitting: *Deleted

## 2016-06-02 DIAGNOSIS — I639 Cerebral infarction, unspecified: Secondary | ICD-10-CM | POA: Diagnosis not present

## 2016-06-05 NOTE — Progress Notes (Signed)
Carelink Summary Report / Loop Recorder 

## 2016-06-06 ENCOUNTER — Telehealth: Payer: Self-pay | Admitting: *Deleted

## 2016-06-06 NOTE — Telephone Encounter (Signed)
LMOVM requesting call back.  Gave [facility] phone number for return call.  Home and first cell numbers out of service.  Will request a manual Carelink transmission for review of episodes.

## 2016-06-13 NOTE — Telephone Encounter (Signed)
Manual transmission received on 06/09/16.  Episodes printed for review by MD.

## 2016-06-14 NOTE — Telephone Encounter (Signed)
Reviewed "AF" episodes with Dr. Johney FrameAllred.  ECGs indicate "likely sinus, cannot exclude A-flutter".  Will continue to monitor for atrial arrhythmias via Carelink monitor.

## 2016-06-16 ENCOUNTER — Telehealth: Payer: Self-pay | Admitting: *Deleted

## 2016-06-16 NOTE — Telephone Encounter (Signed)
MRI C spine showed mild degenerative changes at level C5/6 and more moderate degenerative changes at C3/4, all in all, no spinal cord changes or herniated disc, no findings that may warrant urgent FU or surgical eval.  MRI brain showed mild atrophy/volume loss, but not unusual for age, stable findings in keeping with aging and stable findings overall from 11/17 with respect to the contrast enhancement, which is reassuring. No immediate actions required, proceed as discussed by Dr. Marjory LiesPenumalli during visit last month, ie fall precaution, using walker etc.  Please call patient or caretaker to notify.

## 2016-06-16 NOTE — Telephone Encounter (Signed)
Per Dr Frances FurbishAthar, LVM informing patient that her MRI brain and cervical spine (neck) basically show age -related stable  changes.  Advised there are no findings that warrant an urgent follow up or surgical evaluation. Advised she continue to use precaution against falling, use her walker and call for any questions, needs.,  Advised her the office is now closed, reopens Monday. Left office number.

## 2016-06-17 LAB — CUP PACEART REMOTE DEVICE CHECK
Implantable Pulse Generator Implant Date: 20171005
MDC IDC SESS DTM: 20180103123606

## 2016-06-20 ENCOUNTER — Other Ambulatory Visit: Payer: Self-pay | Admitting: Internal Medicine

## 2016-06-29 LAB — CUP PACEART REMOTE DEVICE CHECK
Date Time Interrogation Session: 20180202123615
MDC IDC PG IMPLANT DT: 20171005

## 2016-06-29 NOTE — Progress Notes (Signed)
Carelink summary report received. Battery status OK. Normal device function. No new symptom episodes, tachy episodes, brady, or pause episodes. No new AF episodes. Monthly summary reports and ROV/PRN 

## 2016-07-03 ENCOUNTER — Ambulatory Visit (INDEPENDENT_AMBULATORY_CARE_PROVIDER_SITE_OTHER): Payer: Medicare Other | Admitting: *Deleted

## 2016-07-03 DIAGNOSIS — I639 Cerebral infarction, unspecified: Secondary | ICD-10-CM | POA: Diagnosis not present

## 2016-07-03 NOTE — Progress Notes (Signed)
Carelink Summary Report / Loop Recorder 

## 2016-07-15 LAB — CUP PACEART REMOTE DEVICE CHECK
Date Time Interrogation Session: 20180304123813
Implantable Pulse Generator Implant Date: 20171005

## 2016-07-15 NOTE — Progress Notes (Signed)
Carelink summary report received. Battery status OK. Normal device function. No new symptom episodes, tachy episodes, brady, or pause episodes. 9 AF 0.1% ECGs appear SR w/ PACs. Monthly summary reports and ROV/PRN

## 2016-07-25 ENCOUNTER — Other Ambulatory Visit: Payer: Self-pay | Admitting: Internal Medicine

## 2016-07-25 ENCOUNTER — Telehealth: Payer: Self-pay | Admitting: *Deleted

## 2016-07-25 NOTE — Telephone Encounter (Signed)
LVM requesting patient call back to reschedule follow up. Provider will be out of the office. Left number.

## 2016-07-26 ENCOUNTER — Ambulatory Visit: Payer: Medicare Other | Admitting: Diagnostic Neuroimaging

## 2016-08-01 ENCOUNTER — Ambulatory Visit (INDEPENDENT_AMBULATORY_CARE_PROVIDER_SITE_OTHER): Payer: Medicare Other | Admitting: *Deleted

## 2016-08-01 DIAGNOSIS — I639 Cerebral infarction, unspecified: Secondary | ICD-10-CM | POA: Diagnosis not present

## 2016-08-01 NOTE — Progress Notes (Signed)
Carelink Summary Report / Loop Recorder 

## 2016-08-07 LAB — CUP PACEART REMOTE DEVICE CHECK
Implantable Pulse Generator Implant Date: 20171005
MDC IDC SESS DTM: 20180403123607

## 2016-08-07 NOTE — Progress Notes (Signed)
Carelink summary report received. Battery status OK. Normal device function. No new symptom episodes, tachy episodes, brady, or pause episodes. 1 AF 0% available ECG appears SR w/ PACs. See ECG. Monthly summary reports and ROV/PRN

## 2016-08-31 ENCOUNTER — Ambulatory Visit (INDEPENDENT_AMBULATORY_CARE_PROVIDER_SITE_OTHER): Payer: Medicare Other | Admitting: *Deleted

## 2016-08-31 DIAGNOSIS — I639 Cerebral infarction, unspecified: Secondary | ICD-10-CM | POA: Diagnosis not present

## 2016-09-04 NOTE — Progress Notes (Signed)
Carelink Summary Report / Loop Recorder 

## 2016-09-08 ENCOUNTER — Ambulatory Visit: Payer: Medicare Other | Admitting: Diagnostic Neuroimaging

## 2016-09-12 LAB — CUP PACEART REMOTE DEVICE CHECK
Date Time Interrogation Session: 20180503133639
MDC IDC PG IMPLANT DT: 20171005

## 2016-10-02 ENCOUNTER — Ambulatory Visit (INDEPENDENT_AMBULATORY_CARE_PROVIDER_SITE_OTHER): Payer: Medicare Other | Admitting: *Deleted

## 2016-10-02 DIAGNOSIS — I639 Cerebral infarction, unspecified: Secondary | ICD-10-CM

## 2016-10-02 NOTE — Progress Notes (Signed)
Carelink Summary Report / Loop Recorder 

## 2016-10-04 LAB — CUP PACEART REMOTE DEVICE CHECK
Date Time Interrogation Session: 20180602133830
MDC IDC PG IMPLANT DT: 20171005

## 2016-10-18 ENCOUNTER — Encounter: Payer: Self-pay | Admitting: Nurse Practitioner

## 2016-10-30 ENCOUNTER — Ambulatory Visit (INDEPENDENT_AMBULATORY_CARE_PROVIDER_SITE_OTHER): Payer: Medicare Other | Admitting: *Deleted

## 2016-10-30 DIAGNOSIS — I639 Cerebral infarction, unspecified: Secondary | ICD-10-CM

## 2016-10-30 NOTE — Progress Notes (Signed)
Carelink Summary Report / Loop Recorder 

## 2016-11-07 ENCOUNTER — Encounter: Payer: Self-pay | Admitting: Nurse Practitioner

## 2016-11-07 ENCOUNTER — Encounter (INDEPENDENT_AMBULATORY_CARE_PROVIDER_SITE_OTHER): Payer: Self-pay

## 2016-11-07 ENCOUNTER — Ambulatory Visit (INDEPENDENT_AMBULATORY_CARE_PROVIDER_SITE_OTHER): Payer: Medicare Other | Admitting: Nurse Practitioner

## 2016-11-07 VITALS — BP 132/74 | HR 72 | Ht 59.0 in | Wt 134.0 lb

## 2016-11-07 DIAGNOSIS — R1310 Dysphagia, unspecified: Secondary | ICD-10-CM

## 2016-11-07 DIAGNOSIS — R634 Abnormal weight loss: Secondary | ICD-10-CM | POA: Diagnosis not present

## 2016-11-07 NOTE — Progress Notes (Signed)
HPI:  Patient is an 83 year old female known very remotely to Dr. Marina GoodellPerry. She apparently has a history of achalasia, I do not have her old office records except for an EGD done in 2004. The EGD was done for dysphagia and revealed a distal esophageal stricture. Stricture was dilated under fluoroscopy with a savory dilator up to 18 mm. Patient has pulmonary fibrosis, she is followed by Dr. Sherene SiresWert.  She is supposed to be on oxygen at home but does not use it. Patient referred by PCP Dr. Evlyn KannerSouth for dysphagia and abnormal chest CT scan revealing nonspecific focal coarse mural calcifications anteriorly in the lower thoracic esophagus near the GE junction. She has lost about 8 pounds since January but reports a great appetite. Patient has chronic dysphagia but for the most part eats what she wants.  She has to manage eating habits by eating slowly, taking small bites and being careful to sit up straight during meals. She isn't interested in EGD with dilation as the last one didn't have lasting effects anyway. She has no heartburn or other GERD symptoms. Her husband has been undergoing chemotherapy, though she has enough on her plate at this time. Labs from late June reviewed. LFTs normal.    Past Medical History:  Diagnosis Date  . Arthritis   . Cervical stenosis (uterine cervix)   . Esophageal stricture   . GERD (gastroesophageal reflux disease)   . Hypothyroidism   . Osteopenia   . Other and unspecified hyperlipidemia   . Pulmonary fibrosis (HCC)   . Unspecified essential hypertension      Past Surgical History:  Procedure Laterality Date  . ABDOMINAL HYSTERECTOMY  1978  . EP IMPLANTABLE DEVICE N/A 02/03/2016   Procedure: Loop Recorder Insertion;  Surgeon: Hillis RangeJames Allred, MD;  Location: [facility];  Service: Cardiovascular;  Laterality: N/A;  . foot susrgery    . NASAL SINUS SURGERY    . REVISION TOTAL KNEE ARTHROPLASTY    . rotator cuff surgery Left 2014  . TOTAL HIP ARTHROPLASTY      right  . VESICOVAGINAL FISTULA CLOSURE W/ TAH  1978   Family History  Problem Relation Age of Onset  . Heart disease Father   . Stroke Father   . Tuberculosis Maternal Grandfather   . Atopy Neg Hx    Social History  Substance Use Topics  . Smoking status: Never Smoker  . Smokeless tobacco: Never Used  . Alcohol use No   Current Outpatient Prescriptions  Medication Sig Dispense Refill  . acetaminophen (TYLENOL) 500 MG tablet Take 500-1,000 mg by mouth every 6 (six) hours as needed for mild pain (depends on pain).     Marland Kitchen. ALPRAZolam (XANAX) 0.25 MG tablet Take 0.25 mg by mouth daily as needed. For anxiety    . aspirin EC 81 MG tablet Take 81 mg by mouth daily.    . Azilsartan-Chlorthalidone 40-25 MG TABS Take 0.5 mg by mouth daily as needed (BP>140).     . B Complex Vitamins (B-COMPLEX/B-12 PO) Take 1 tablet by mouth daily.    . fluticasone (FLONASE) 50 MCG/ACT nasal spray Place 2 sprays into the nose every evening.     Marland Kitchen. levothyroxine (SYNTHROID, LEVOTHROID) 137 MCG tablet Take 137 mcg by mouth daily before breakfast.    . oxymetazoline (AFRIN) 0.05 % nasal spray Place 2 sprays into the nose every evening.     . rosuvastatin (CRESTOR) 10 MG tablet Take 10 mg by mouth daily.      .Marland Kitchen  traMADol (ULTRAM) 50 MG tablet Take by mouth every 6 (six) hours as needed.     No current facility-administered medications for this visit.    Allergies  Allergen Reactions  . Macrodantin [Nitrofurantoin Macrocrystal] Other (See Comments)    Advised by MD not to take this med anymore     Review of Systems: Positive for allergy, sinus trouble, vision changes, fatigue, itching, shortness breath, sleeping problems and urine leakage. All other systems reviewed and negative except where noted in HPI.    Physical Exam: BP 132/74 (BP Location: Right Arm, Patient Position: Sitting, Cuff Size: Normal)   Pulse 72   Ht 4\' 11"  (1.499 m)   Wt 134 lb (60.8 kg)   BMI 27.06 kg/m  Constitutional:   Well-developed, white female in no acute distress. Psychiatric: Normal mood and affect. Behavior is normal. EENT: Pupils normal.  Conjunctivae are normal. No scleral icterus. Neck supple.  Cardiovascular: Normal rate, regular rhythm. No edema Pulmonary/chest: Effort normal and breath sounds normal. No wheezing, rales or rhonchi. Abdominal: Soft, nondistended. Nontender. Bowel sounds active throughout. There are no masses palpable. No hepatomegaly. Lymphadenopathy: No cervical adenopathy noted. Neurological: Alert and oriented to person place and time. Skin: Skin is warm and dry. No rashes noted.   ASSESSMENT AND PLAN:  83 year old female with chronic dysphagia. She apparently has a history of achalasia, known remotely to Dr. Marina Goodell. I do not have office records dating back that far with the exception of an EGD in 2004 with findings of an esophageal stricture which was dilated. Patient referred by PCP because of her chronic dysphagia but also for evaluation of a CT scan of the chest revealing some nonspecific focal coarse mural calcifications in the lower thoracic esophagus near the GE junction. I don't know if findings are clinically significant . Her weight is down 6 pounds over the last year but her chronic dysphagia has not progressed. Patient adamantly refuses an EGD at this time. We discussed a barium swallow but despite findings patient will not proceed with a follow-up EGD at this time -Advised her to continue with dysphagia precautions.Eat small bites, chew well with liquids in between bites to avoid food impaction. -Explained to patient that we would be happy to see her back and proceed with workup at any time.    Willette Cluster, NP  11/07/2016, 1:48 PM  Cc: Adrian Prince, MD

## 2016-11-07 NOTE — Patient Instructions (Addendum)
If you are age 165 or older, your body mass index should be between 23-30. Your Body mass index is 27.06 kg/m. If this is out of the aforementioned range listed, please consider follow up with your Primary Care Provider.  If you are age 81 or younger, your body mass index should be between 19-25. Your Body mass index is 27.06 kg/m. If this is out of the aformentioned range listed, please consider follow up with your Primary Care Provider.   Patient informed to call back if she decides she wants to reschedule esophogram.  Thank you for choosing me and [facility].   Willette ClusterPaula Guenther, NP

## 2016-11-09 LAB — CUP PACEART REMOTE DEVICE CHECK
MDC IDC PG IMPLANT DT: 20171005
MDC IDC SESS DTM: 20180702144352

## 2016-11-10 ENCOUNTER — Encounter: Payer: Self-pay | Admitting: Diagnostic Neuroimaging

## 2016-11-10 ENCOUNTER — Ambulatory Visit (INDEPENDENT_AMBULATORY_CARE_PROVIDER_SITE_OTHER): Payer: Medicare Other | Admitting: Diagnostic Neuroimaging

## 2016-11-10 VITALS — BP 139/81 | HR 65 | Ht 59.0 in | Wt 134.2 lb

## 2016-11-10 DIAGNOSIS — M4802 Spinal stenosis, cervical region: Secondary | ICD-10-CM

## 2016-11-10 DIAGNOSIS — G039 Meningitis, unspecified: Secondary | ICD-10-CM | POA: Diagnosis not present

## 2016-11-10 DIAGNOSIS — R9089 Other abnormal findings on diagnostic imaging of central nervous system: Secondary | ICD-10-CM | POA: Diagnosis not present

## 2016-11-10 DIAGNOSIS — R269 Unspecified abnormalities of gait and mobility: Secondary | ICD-10-CM

## 2016-11-10 NOTE — Progress Notes (Signed)
Assessment, plans, and patient preference is noted

## 2016-11-10 NOTE — Progress Notes (Signed)
[facility]  PATIENT: Kayla Wells DOB: [birth year]  REFERRING CLINICIAN: [place] HISTORY FROM: patient  REASON FOR VISIT: follow up    HISTORICAL  CHIEF COMPLAINT:  Chief Complaint  Patient presents with  . Follow-up  . ABL MRI    Has had one fall June 2018, no injury.   Using cane.     HISTORY OF PRESENT ILLNESS:   UPDATE 11/10/16: Since last visit, stable, except 1 fall when she tripped on vacuum cleaner cord. MRI results reviewed. 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   PRIOR HPI (05/10/16): 83 year old right-handed female here for evaluation of abnormal MRI and gait/balance difficulty. Patient has history of mini strokes, arm surgery, hip replacement, foot surgery, hypercholesteremia, anxiety, fibromyalgia. Summer 2017 patient fell twice from her swing at home, hitting her head on concrete/iron railing. No loss of consciousness. She had swelling and pain on her back of her head. She did not seek medical attention for these falls/head injuries. September 2017 patient had 5 minute episode of right arm weakness and slurred speech. Later that month she had another episode of right arm weakness and slurred speech lasting for one hour. Patient went to the hospital for evaluation on 01/21/16 for TIA/stroke workup. Patient opted to go home instead of staying for workup at the hospital. Patient followed up with PCP who ordered MRI scan of the brain. Patient was found to have diffuse pachymeningeal enhancement, but no other acute findings. No significant intracranial or extra cranial stenosis was noted. However possible C3-4 spinal stenosis was noted on sagittal MRA head views. Since that time patient continues that have balance and gait difficulty, tripping and falling. She denies any significant headache, neck pain, vision changes or slurred speech. No positional headache changes or problems. No stable and  dizziness.   REVIEW OF SYSTEMS: Full 14 system review of systems performed and negative with exception of: numbness neck stiff muscle cramps fatigue ringing in ears shortness of breath.    ALLERGIES: Allergies  Allergen Reactions  . Fish Oil   . Macrodantin [Nitrofurantoin Macrocrystal] Other (See Comments)    Advised by MD not to take this med anymore    HOME MEDICATIONS: Outpatient Medications Prior to Visit  Medication Sig Dispense Refill  . acetaminophen (TYLENOL) 500 MG tablet Take 500-1,000 mg by mouth every 6 (six) hours as needed for mild pain (depends on pain).     Marland Kitchen ALPRAZolam (XANAX) 0.25 MG tablet Take 0.25 mg by mouth daily as needed. For anxiety    . aspirin EC 81 MG tablet Take 81 mg by mouth daily.    . Azilsartan-Chlorthalidone 40-25 MG TABS Take 0.5 mg by mouth daily as needed (BP>140).     . B Complex Vitamins (B-COMPLEX/B-12 PO) Take 1 tablet by mouth daily.    . fluticasone (FLONASE) 50 MCG/ACT nasal spray Place 2 sprays into the nose every evening.     Marland Kitchen levothyroxine (SYNTHROID, LEVOTHROID) 137 MCG tablet Take 137 mcg by mouth daily before breakfast.    . oxymetazoline (AFRIN) 0.05 % nasal spray Place 2 sprays into the nose every evening.     . rosuvastatin (CRESTOR) 10 MG tablet Take 10 mg by mouth daily.      . traMADol (ULTRAM) 50 MG tablet Take by mouth every 6 (six) hours as needed.     No facility-administered medications prior to visit.     PAST MEDICAL HISTORY: Past Medical History:  Diagnosis Date  . Arthritis   . Cervical stenosis (uterine cervix)   . Esophageal stricture   . GERD (gastroesophageal reflux disease)   . Hypothyroidism   . Osteopenia   . Other and unspecified hyperlipidemia   . Pulmonary fibrosis (HCC)   . Unspecified essential hypertension     PAST SURGICAL HISTORY: Past Surgical History:  Procedure Laterality Date  . ABDOMINAL HYSTERECTOMY  1978  . EP IMPLANTABLE DEVICE N/A 02/03/2016   Procedure: Loop Recorder  Insertion;  Surgeon: Hillis Range, MD;  Location: [facility];  Service: Cardiovascular;  Laterality: N/A;  . foot susrgery    . NASAL SINUS SURGERY    . REVISION TOTAL KNEE ARTHROPLASTY    . rotator cuff surgery Left 2014  . TOTAL HIP ARTHROPLASTY     right  . VESICOVAGINAL FISTULA CLOSURE W/ TAH  1978    FAMILY HISTORY: Family History  Problem Relation Age of Onset  . Heart disease Father   . Stroke Father   . Tuberculosis Maternal Grandfather   . Atopy Neg Hx     SOCIAL HISTORY:  Social History   Social History  . Marital status: Married    Spouse name: Max  . Number of children: 2  . Years of education: 14   Occupational History  . retired    Social History Main Topics  . Smoking status: Never Smoker  . Smokeless tobacco: Never Used  . Alcohol use No  . Drug use: No  . Sexual activity: Not on file   Other Topics Concern  . Not on file   Social History Narrative   Lives with husband   Caffeine- coffee, tea     PHYSICAL EXAM  GENERAL EXAM/CONSTITUTIONAL: Vitals:  Vitals:   11/10/16 0925  BP: 139/81  Pulse: 65  Weight: 134 lb 3.2 oz (60.9 kg)  Height: 4\' 11"  (1.499 m)   Body mass index is 27.11 kg/m. No exam data present  Patient is in no distress; well developed, nourished and groomed; neck is supple  CARDIOVASCULAR:  Examination of carotid arteries is normal; no carotid bruits  Regular rate and rhythm, no murmurs  Examination of peripheral vascular system by observation and palpation is normal  EYES:  Ophthalmoscopic exam of optic discs and posterior segments is normal; no papilledema or hemorrhages  MUSCULOSKELETAL:  Gait, strength, tone, movements noted in Neurologic exam below  NEUROLOGIC: MENTAL STATUS:  No flowsheet data found.  awake, alert, oriented to person, place and time  recent and remote memory intact  normal attention and concentration  language fluent, comprehension intact, naming intact,   fund of  knowledge appropriate  CRANIAL NERVE:   2nd - no papilledema on fundoscopic exam  2nd, 3rd, 4th, 6th - pupils equal and reactive to light, visual fields full to confrontation, extraocular muscles intact, no nystagmus  5th - facial sensation symmetric  7th - facial strength symmetric  8th - hearing intact  9th - palate elevates symmetrically, uvula midline  11th - shoulder shrug symmetric  12th - tongue protrusion midline  MOTOR:   normal bulk and tone, full strength in the BUE, BLE  EXCEPT BILATERAL HIP FLEXION 4  SENSORY:   normal and symmetric to light touch, temperature, vibration  COORDINATION:   finger-nose-finger, fine finger movements normal  REFLEXES:   deep tendon reflexes TRACE and symmetric  GAIT/STATION:   SLOW TO RISE; UNSTEADY; STIFF GAIT; USES SINGLE POINT CANE    DIAGNOSTIC DATA (LABS, IMAGING, TESTING) - I reviewed  patient records, labs, notes, testing and imaging myself where available.  Lab Results  Component Value Date   WBC 6.7 01/21/2016   HGB 15.6 (H) 01/21/2016   HCT 46.0 01/21/2016   MCV 90.0 01/21/2016   PLT 227 01/21/2016      Component Value Date/Time   NA 141 01/21/2016 1245   K 3.6 01/21/2016 1245   CL 103 01/21/2016 1245   CO2 25 01/21/2016 1220   GLUCOSE 96 01/21/2016 1245   BUN 24 (H) 01/21/2016 1245   CREATININE 0.80 01/21/2016 1245   CALCIUM 9.7 01/21/2016 1220   PROT 7.2 01/21/2016 1220   ALBUMIN 3.9 01/21/2016 1220   AST 26 01/21/2016 1220   ALT 19 01/21/2016 1220   ALKPHOS 35 (L) 01/21/2016 1220   BILITOT 0.6 01/21/2016 1220   GFRNONAA >60 01/21/2016 1220   GFRAA >60 01/21/2016 1220   No results found for: CHOL, HDL, LDLCALC, LDLDIRECT, TRIG, CHOLHDL No results found for: ZOXW9U No results found for: VITAMINB12 No results found for: TSH   03/01/16 MRI brain [I reviewed images myself and agree with interpretation. -VRP]  - Symmetric pachymeningeal enhancement, greatest anteriorly. Intracranial  hypotension is probably the most likely cause, although this appearance can be idiopathic. More concerning abnormalities include inflammatory or granulomatous processes such as rheumatoid arthritis or sarcoidosis, chronic infection, or neoplasia. The latter are not favored. - No abnormal parenchymal enhancement or evidence for acute stroke/hemorrhage. - Mild atrophy and small vessel disease. - Suspected significant spinal stenosis at C3-4, related to central extrusion and anterolisthesis. Correlate clinically for myelopathy.  03/01/16 MRA head/neck [I reviewed images myself and agree with interpretation. -VRP]  - No intracranial or extracranial flow reducing stenosis of significance.  03/10/16 CT chest  1. Fibrotic interstitial lung disease characterized by patchy confluent subpleural reticulation, traction bronchiolectasis, mild architectural distortion and bibasilar honeycombing, with a slight basilar gradient. Interval progression of findings since 04/07/2009 chest CT. High-resolution CT findings are most consistent with usual interstitial pneumonia (UIP), although the rate of progression is somewhat slow. 2. Mild cardiomegaly. Dilated main pulmonary artery (3.5 cm), increased, suggesting pulmonary arterial hypertension . 3. Aortic atherosclerosis.  Three-vessel coronary atherosclerosis.  4. Nonspecific focal coarse mural calcification anteriorly in the lower thoracic esophagus near the esophagogastric junction, increased. Consider correlation with upper endoscopy as clinically warranted.  01/27/16 TTE - No cardiac source of emboli was indentified.  05/31/16 MRI cervical spine (without) demonstrating: 1. At C3-4: pseudo disc bulging (posterior and rightward), facet hypertrophy, with moderate spinal stenosis and moderate biforaminal stenosis. No cord signal abnormalities. 2. At C5-6: broad osteophytic bar, uncovertebral joint hypertrophy, with mild spinal stenosis and mild left foraminal  stenosis. No cord signal abnormalities.  05/31/16 MRI brain (with and without) demonstrating: 1. Stable, symmetric and diffuse pachymeningeal enhancement on post-contrast views. Likely represents findings related to intracranial hypotension. Other less likely considerations include autoimmune, inflammatory, granulomatous, infectious or neoplastic etiologies.  2. Mild perisylvian and anterior temporal atrophy. 3. Mild periventricular and subcortical foci of non-specific T2 hyperintensities. 4. Moderate spinal stenosis at C3-4, better visualized on MRI cervical spine from the same day.  5. No acute findings. 6. No significant change from MRI on 03/01/16.      ASSESSMENT AND PLAN  83 y.o. year old female here with new onset gait and balance difficulty, falls, with transient right arm weakness/numbness and slurred speech 2. May have represented transient ischemic attacks.  Also found to have nonspecific dural enhancement, likely an incidental finding, but could be related to  history of head traumas in the past and mild CSF leak. No significant clinical symptoms to correlate with intracranial hypotension or dural enhancement at this time. Repeat MRI brain was stable.   Also found to have cervical spinal stenosis on MRI brain. Follow up MRI cervical spine shows moderate spinal stenosis at C3-4 and mild spinal stenosis at C5-6. However, no cord signal abnormalities.    Dx:  1. Abnormal finding on MRI of brain   2. Pachymeningitis   3. Spinal stenosis in cervical region   4. Gait difficulty      PLAN:  I spent 25 minutes of face to face time with patient. Greater than 50% of time was spent in counseling and coordination of care with patient. In summary we discussed:   NON-SPECIFIC DURAL ENHANCEMENT ON MRI BRAIN - not likely a serious finding at this time; likely incidental - stable from Nov 2017 to Jan 2018 - monitor for now  GAIT DIFFICULTY - multi-factorial; also with moderate  spinal stenosis at C3-4 and mild spinal stenosis at C5-6. However, no cord signal abnormalities.  - discussed mgmt options; will continue conservative approarch at this time - use rollator walker; caution with balance and walking and fall precautions - consider PT evaluation  Return if symptoms worsen or fail to improve, for return to PCP. follow up with us in neurology clinic as needed    Suanne MarkerVIKRAM R. PENUMALLI, MD 11/10/2016, 9:39 AM Certified in Neurology, Neurophysiology and Neuroimaging  [facility] [address], [address] [place], [place] [address] [phone]

## 2016-11-10 NOTE — Patient Instructions (Signed)
-   monitor symptoms  - caution with balance and falling  - consider physical therapy evaluation

## 2016-11-24 ENCOUNTER — Ambulatory Visit (HOSPITAL_COMMUNITY): Payer: Medicare Other

## 2016-11-29 ENCOUNTER — Ambulatory Visit (INDEPENDENT_AMBULATORY_CARE_PROVIDER_SITE_OTHER): Payer: Medicare Other | Admitting: *Deleted

## 2016-11-29 DIAGNOSIS — I639 Cerebral infarction, unspecified: Secondary | ICD-10-CM

## 2016-11-30 NOTE — Progress Notes (Signed)
Carelink Summary Report / Loop Recorder 

## 2016-12-11 LAB — CUP PACEART REMOTE DEVICE CHECK
Date Time Interrogation Session: 20180801164109
MDC IDC PG IMPLANT DT: 20171005

## 2016-12-13 ENCOUNTER — Other Ambulatory Visit: Payer: Self-pay | Admitting: Internal Medicine

## 2016-12-21 ENCOUNTER — Emergency Department (HOSPITAL_COMMUNITY): Payer: Medicare Other

## 2016-12-21 ENCOUNTER — Emergency Department (HOSPITAL_COMMUNITY)
Admission: EM | Admit: 2016-12-21 | Discharge: 2016-12-21 | Disposition: A | Discharge status: Home / Self Care | Payer: Medicare Other | Attending: Emergency Medicine | Admitting: Emergency Medicine

## 2016-12-21 ENCOUNTER — Encounter (HOSPITAL_COMMUNITY): Payer: Self-pay

## 2016-12-21 DIAGNOSIS — Y999 Unspecified external cause status: Secondary | ICD-10-CM | POA: Insufficient documentation

## 2016-12-21 DIAGNOSIS — Y929 Unspecified place or not applicable: Secondary | ICD-10-CM | POA: Diagnosis not present

## 2016-12-21 DIAGNOSIS — Y939 Activity, unspecified: Secondary | ICD-10-CM | POA: Insufficient documentation

## 2016-12-21 DIAGNOSIS — T84020A Dislocation of internal right hip prosthesis, initial encounter: Secondary | ICD-10-CM | POA: Insufficient documentation

## 2016-12-21 DIAGNOSIS — Y792 Prosthetic and other implants, materials and accessory orthopedic devices associated with adverse incidents: Secondary | ICD-10-CM | POA: Diagnosis not present

## 2016-12-21 DIAGNOSIS — X500XXA Overexertion from strenuous movement or load, initial encounter: Secondary | ICD-10-CM | POA: Diagnosis not present

## 2016-12-21 DIAGNOSIS — S73004A Unspecified dislocation of right hip, initial encounter: Secondary | ICD-10-CM

## 2016-12-21 DIAGNOSIS — S79911A Unspecified injury of right hip, initial encounter: Secondary | ICD-10-CM | POA: Diagnosis present

## 2016-12-21 MED ORDER — ETOMIDATE 2 MG/ML IV SOLN
INTRAVENOUS | Status: AC
  Administered 2016-12-21: 9.52 mg via INTRAVENOUS
  Filled 2016-12-21: qty 10

## 2016-12-21 MED ORDER — ETOMIDATE 2 MG/ML IV SOLN
0.1500 mg/kg | Freq: Once | INTRAVENOUS | Status: AC
  Administered 2016-12-21: 9.52 mg via INTRAVENOUS

## 2016-12-21 NOTE — ED Provider Notes (Signed)
[facility]-EMERGENCY DEPT Provider Note   CSN: [id number] Arrival date & time: 12/21/16  2023     History   Chief Complaint Chief Complaint  Patient presents with  . Right hip pain    HPI Kayla Wells is a 83 y.o. female.  She injured her right hip, when she bent over to plug in a lamp.  She was able to get to a chair and sit down, without falling after this incident.  She denies pain other than her right hip.  She last ate a meal, at 3:30 PM today.  She denies other recent or concurrent medical problems.  She is taking her usual medications.  She has had prior hip replacement, remotely, and several episodes of hip dislocation subsequent to that.  There are no other known modifying factors.   HPI  Past Medical History:  Diagnosis Date  . Arthritis   . Cervical stenosis (uterine cervix)   . Esophageal stricture   . GERD (gastroesophageal reflux disease)   . Hypothyroidism   . Osteopenia   . Other and unspecified hyperlipidemia   . Pulmonary fibrosis (HCC)   . Unspecified essential hypertension     Patient Active Problem List   Diagnosis Date Noted  . Cryptogenic stroke (HCC) 02/03/2016  . Cough 08/29/2010  . GERD 05/14/2009  . CYSTITIS, ACUTE 05/05/2009  . HYPERLIPIDEMIA 04/14/2009  . HYPERTENSION 04/14/2009  . PULMONARY FIBROSIS ILD POST INFLAMMATORY CHRONIC 04/14/2009    Past Surgical History:  Procedure Laterality Date  . ABDOMINAL HYSTERECTOMY  1978  . EP IMPLANTABLE DEVICE N/A 02/03/2016   Procedure: Loop Recorder Insertion;  Surgeon: Hillis Range, MD;  Location: [facility];  Service: Cardiovascular;  Laterality: N/A;  . foot susrgery    . NASAL SINUS SURGERY    . REVISION TOTAL KNEE ARTHROPLASTY    . rotator cuff surgery Left 2014  . TOTAL HIP ARTHROPLASTY     right  . VESICOVAGINAL FISTULA CLOSURE W/ TAH  1978    OB History    No data available       Home Medications    Prior to Admission medications   Medication Sig Start Date End Date  Taking? Authorizing Provider  acetaminophen (TYLENOL) 500 MG tablet Take 500-1,000 mg by mouth every 6 (six) hours as needed for mild pain (depends on pain).     [provider]  ALPRAZolam Prudy Feeler) 0.25 MG tablet Take 0.25 mg by mouth daily as needed. For anxiety    [provider]  aspirin EC 81 MG tablet Take 81 mg by mouth daily.    [provider]  Azilsartan-Chlorthalidone 40-25 MG TABS Take 0.5 mg by mouth daily as needed (BP>140).     [provider]  B Complex Vitamins (B-COMPLEX/B-12 PO) Take 1 tablet by mouth daily.    [provider]  fluticasone (FLONASE) 50 MCG/ACT nasal spray Place 2 sprays into the nose every evening.     [provider]  levothyroxine (SYNTHROID, LEVOTHROID) 137 MCG tablet Take 137 mcg by mouth daily before breakfast.    [provider]  oxymetazoline (AFRIN) 0.05 % nasal spray Place 2 sprays into the nose every evening.     [provider]  polyethylene glycol (MIRALAX / GLYCOLAX) packet Take 17 g by mouth daily as needed.    [provider]  rosuvastatin (CRESTOR) 10 MG tablet Take 10 mg by mouth daily.      [provider]  traMADol (ULTRAM) 50 MG tablet Take  by mouth every 6 (six) hours as needed.    [provider]    Family History Family History  Problem Relation Age of Onset  . Heart disease Father   . Stroke Father   . Tuberculosis Maternal Grandfather   . Atopy Neg Hx     Social History Social History  Substance Use Topics  . Smoking status: Never Smoker  . Smokeless tobacco: Never Used  . Alcohol use No     Allergies   Fish oil and Macrodantin [nitrofurantoin macrocrystal]   Review of Systems Review of Systems  All other systems reviewed and are negative.    Physical Exam Updated Vital Signs BP (!) 158/72   Pulse (!) 58   Temp 97.6 F (36.4 C) (Oral)   Resp 14   Ht 4\' 11"  (1.499 m)   Wt 63.5 kg (140 lb)   SpO2 98%   BMI  28.28 kg/m   Physical Exam  Constitutional: She is oriented to person, place, and time. She appears well-developed and well-nourished. No distress.  HENT:  Head: Normocephalic and atraumatic.  Eyes: Pupils are equal, round, and reactive to light. Conjunctivae and EOM are normal.  Neck: Normal range of motion and phonation normal. Neck supple.  Cardiovascular: Normal rate and regular rhythm.   Pulmonary/Chest: Effort normal and breath sounds normal. She exhibits no tenderness.  Abdominal: Soft. She exhibits no distension. There is no tenderness. There is no guarding.  Musculoskeletal:  Right hip is tender to palpation.  Right leg shortened and internally rotated, consistent with hip injury.  Neurological: She is alert and oriented to person, place, and time. She exhibits normal muscle tone.  Skin: Skin is warm and dry.  Psychiatric: She has a normal mood and affect. Her behavior is normal. Judgment and thought content normal.  Nursing note and vitals reviewed.    ED Treatments / Results  Labs (all labs ordered are listed, but only abnormal results are displayed) Labs Reviewed - No data to display  EKG  EKG Interpretation None       Radiology Dg Hip Port Unilat W Or Wo Pelvis 1 View Right  Result Date: 12/21/2016 CLINICAL DATA:  Post reduction right hip EXAM: DG HIP (WITH OR WITHOUT PELVIS) 1V PORT RIGHT COMPARISON:  12/21/2016 at 2043 hours FINDINGS: Satisfactory reduction of the right hip arthroplasty femoral head into the acetabular component. No fracture is seen. Visualized bony pelvis appears intact. IMPRESSION: Satisfactory reduction of the right total hip arthroplasty, as above. Electronically Signed   By: Charline Bills M.D.   On: 12/21/2016 22:23   Dg Hip Unilat  With Pelvis 2-3 Views Right  Result Date: 12/21/2016 CLINICAL DATA:  Bent over, felt a pop. EXAM: DG HIP (WITH OR WITHOUT PELVIS) 2-3V RIGHT COMPARISON:  None. FINDINGS: Status post RIGHT hip total  arthroplasty. Dislocated femoral component projecting superior to the acetabular cup. No acute fracture deformity. Old Bilateral superior and inferior pubic rami fractures. Osteopenia. Scattered injection granulomas. IMPRESSION: Dislocated RIGHT hip total arthroplasty. Electronically Signed   By: Awilda Metro M.D.   On: 12/21/2016 21:04    Procedures .Sedation Date/Time: 12/21/2016 10:05 PM Performed by: Mancel Bale Authorized by: Mancel Bale   Consent:    Consent obtained:  Written   Consent given by:  Patient   Risks discussed:  Inadequate sedation, allergic reaction, nausea and vomiting   Alternatives discussed:  Analgesia without sedation Indications:    Procedure performed:  Dislocation reduction   Procedure necessitating sedation performed by:  Physician performing sedation   Intended level of sedation:  Deep Pre-sedation assessment:    Time since last food or drink:  5   ASA classification: class 2 - patient with mild systemic disease     Neck mobility: normal     Mouth opening:  3 or more finger widths   Mallampati score:  II - soft palate, uvula, fauces visible   Pre-sedation assessments completed and reviewed: airway patency, cardiovascular function, hydration status and mental status     Pre-sedation assessments completed and reviewed: nausea/vomiting not reviewed     Pre-sedation assessment completed:  12/21/2016 9:45 PM Immediate pre-procedure details:    Reassessment: Patient reassessed immediately prior to procedure     Reviewed: vital signs and relevant labs/tests     Verified: bag valve mask available, emergency equipment available, intubation equipment available, IV patency confirmed and oxygen available   Procedure details (see MAR for exact dosages):    Sedation start time:  12/21/2016 9:52 PM   Preoxygenation:  Nasal cannula   Sedation:  Etomidate   Analgesia:  Fentanyl   Intra-procedure monitoring:  Blood pressure monitoring, cardiac monitor,  continuous capnometry, continuous pulse oximetry, frequent LOC assessments and frequent vital sign checks   Intra-procedure events: none     Sedation end time:  12/21/2016 10:03 PM   Total sedation time (minutes):  11 Post-procedure details:    Post-sedation assessment completed:  12/21/2016 10:07 PM   Attendance: Constant attendance by certified staff until patient recovered     Recovery: Patient returned to pre-procedure baseline     Estimated blood loss (see I/O flowsheets): no     Patient is stable for discharge or admission: yes     Patient tolerance:  Tolerated well, no immediate complications Reduction of dislocation Date/Time: 12/21/2016 10:08 PM Performed by: Mancel Bale Authorized by: Mancel Bale  Consent: Verbal consent obtained. Written consent obtained. Consent given by: patient Patient understanding: patient states understanding of the procedure being performed Patient consent: the patient's understanding of the procedure matches consent given Procedure consent: procedure consent matches procedure scheduled Relevant documents: relevant documents present and verified Test results: test results available and properly labeled Site marked: the operative site was not marked Imaging studies: imaging studies available Patient identity confirmed: verbally with patient Time out: Immediately prior to procedure a "time out" was called to verify the correct patient, procedure, equipment, support staff and site/side marked as required. Local anesthesia used: no  Anesthesia: Local anesthesia used: no  Sedation: Patient sedated: yes Sedation type: (Deep) Sedatives: etomidate Analgesia: fentanyl Sedation start date/time: 12/21/2016 9:52 PM Sedation end date/time: 12/21/2016 10:05 PM Vitals: Vital signs were monitored during sedation. Patient tolerance: Patient tolerated the procedure well with no immediate complications  .Splint Application Date/Time: 12/21/2016 10:09  PM Performed by: Mancel Bale Authorized by: Mancel Bale   Consent:    Consent obtained:  Verbal and written   Consent given by:  Patient   Alternatives discussed:  No treatment Pre-procedure details:    Sensation:  Normal   Skin color:  Pink Procedure details:    Laterality:  Right   Location:  Knee   Splint type:  Knee immobilizer Post-procedure details:    Pain:  Improved   Sensation:  Normal   Skin color:  Pink   Patient tolerance of procedure:  Tolerated well, no immediate complications      (including critical care time)  Medications Ordered in ED Medications  etomidate (AMIDATE) injection 9.52 mg (9.52 mg Intravenous Given 12/21/16  2153)     Initial Impression / Assessment and Plan / ED Course  I have reviewed the triage vital signs and the nursing notes.  Pertinent labs & imaging results that were available during my care of the patient were reviewed by me and considered in my medical decision making (see chart for details).      Patient Vitals for the past 24 hrs:  BP Temp Temp src Pulse Resp SpO2 Height Weight  12/21/16 2215 (!) 158/72 - - (!) 58 14 98 % - -  12/21/16 2205 (!) 149/69 - - 63 18 98 % - -  12/21/16 2200 (!) 165/79 - - 62 15 96 % - -  12/21/16 2158 (!) 175/69 - - 64 16 94 % - -  12/21/16 2155 (!) 190/139 - - 63 18 92 % - -  12/21/16 2150 (!) 165/75 - - (!) 59 17 98 % - -  12/21/16 2145 (!) 169/82 - - 62 14 99 % - -  12/21/16 2133 - - - - - - 4\' 11"  (1.499 m) 63.5 kg (140 lb)  12/21/16 2114 (!) 159/71 97.6 F (36.4 C) Oral 62 18 94 % - -    10:10 PM Reevaluation with update and discussion. After initial assessment and treatment, an updated evaluation reveals she remains comfortable and has no further complaints.  Findings discussed with patient and son, all questions answered. Marquinn Meschke L       Final Clinical Impressions(s) / ED Diagnoses   Final diagnoses:  Dislocation of right hip, initial encounter (HCC)    Dislocation  of prosthetic hip, reduced with procedural sedation.  No fracture evident.  No indication for further treatment or hospitalization at this time  Nursing Notes Reviewed/ Care Coordinated Applicable Imaging Reviewed Interpretation of Laboratory Data incorporated into ED treatment  The patient appears reasonably screened and/or stabilized for discharge and I doubt any other medical condition or other [facility] requiring further screening, evaluation, or treatment in the ED at this time prior to discharge.  Plan: Home Medications-continue usual medications.  APAP for pain; Home Treatments-knee immobilizer except when bathing; return here if the recommended treatment, does not improve the symptoms; Recommended follow up-orthopedic follow-up 1 week and as needed   New Prescriptions New Prescriptions   No medications on file     Mancel Bale, MD 12/21/16 2233

## 2016-12-21 NOTE — ED Notes (Signed)
Bed: KZ60 Expected date:  Expected time:  Means of arrival:  Comments: EMS hip pain

## 2016-12-21 NOTE — Discharge Instructions (Signed)
Keep the knee immobilizer on except when changing and bathing.  When the immobilizer is off be very careful to avoid any bending at the waist, or bending of the right knee.  Take Tylenol as needed for pain.

## 2016-12-21 NOTE — ED Notes (Signed)
2155 vital repeated d/t pt being moved around.

## 2016-12-21 NOTE — ED Triage Notes (Signed)
Patient arrives by EMS and states she was reaching over to plug something in and dislocated her right hip-states this has happened several times. EMS administered 150 mcg fentanyl with decreased pain to 3/10. BP 140/64 HR 70 RR 18 O2 sat 96% on RA-shortening and rotation to RLE. Patient is alert and oriented.

## 2016-12-29 ENCOUNTER — Ambulatory Visit (INDEPENDENT_AMBULATORY_CARE_PROVIDER_SITE_OTHER): Payer: Medicare Other | Admitting: *Deleted

## 2016-12-29 DIAGNOSIS — I639 Cerebral infarction, unspecified: Secondary | ICD-10-CM | POA: Diagnosis not present

## 2016-12-29 LAB — CUP PACEART REMOTE DEVICE CHECK
MDC IDC PG IMPLANT DT: 20171005
MDC IDC SESS DTM: 20180831173951

## 2016-12-29 NOTE — Progress Notes (Signed)
Carelink Summary Report / Loop Recorder 

## 2016-12-29 NOTE — Progress Notes (Signed)
Carelink summary report received. Battery status OK. Normal device function. No new symptom episodes, tachy episodes, brady, or pause episodes. 2 AF- 1 w/ ECG appears SR w/ PACs, 1 noted in EPIC as SR w/ non sustained atrial arrhythmias. Monthly summary reports and ROV/PRN

## 2017-01-02 ENCOUNTER — Telehealth: Payer: Self-pay | Admitting: *Deleted

## 2017-01-02 NOTE — Telephone Encounter (Signed)
Spoke with patient regarding sending manual transmission. 5 AF episodes noted, 1 available ECG appears False-- SR with PACs. Patient sent manual transmission successfully. Advised patient once manual transmission received will review it and will call back if further recommendations are needed. Patient verbalized understanding.

## 2017-01-03 NOTE — Telephone Encounter (Signed)
Manual Transmission received. 5 AF episodes appear False, SR with PACs. Will place in Dr. Alexander BergeronAllred's LINQ Folder for review.

## 2017-01-29 ENCOUNTER — Ambulatory Visit (INDEPENDENT_AMBULATORY_CARE_PROVIDER_SITE_OTHER): Payer: Medicare Other | Admitting: *Deleted

## 2017-01-29 DIAGNOSIS — I639 Cerebral infarction, unspecified: Secondary | ICD-10-CM | POA: Diagnosis not present

## 2017-01-29 NOTE — Progress Notes (Signed)
Carelink Summary Report / Loop Recorder 

## 2017-01-30 LAB — CUP PACEART REMOTE DEVICE CHECK
Date Time Interrogation Session: 20180930214027
MDC IDC PG IMPLANT DT: 20171005

## 2017-01-31 ENCOUNTER — Telehealth: Payer: Self-pay | Admitting: Cardiology

## 2017-01-31 NOTE — Telephone Encounter (Signed)
Spoke w/ pt and requested that she send a manual transmission b/c her home monitor has not updated in at least 14 days.   

## 2017-02-27 ENCOUNTER — Ambulatory Visit (INDEPENDENT_AMBULATORY_CARE_PROVIDER_SITE_OTHER): Payer: Medicare Other | Admitting: *Deleted

## 2017-02-27 DIAGNOSIS — I639 Cerebral infarction, unspecified: Secondary | ICD-10-CM | POA: Diagnosis not present

## 2017-03-01 NOTE — Progress Notes (Signed)
Carelink Summary Report / Loop Recorder 

## 2017-03-02 ENCOUNTER — Other Ambulatory Visit: Payer: Self-pay | Admitting: Internal Medicine

## 2017-03-02 LAB — CUP PACEART REMOTE DEVICE CHECK
Implantable Pulse Generator Implant Date: 20171005
MDC IDC SESS DTM: 20181030221114

## 2017-03-29 ENCOUNTER — Ambulatory Visit (INDEPENDENT_AMBULATORY_CARE_PROVIDER_SITE_OTHER): Payer: Medicare Other | Admitting: *Deleted

## 2017-03-29 DIAGNOSIS — I639 Cerebral infarction, unspecified: Secondary | ICD-10-CM

## 2017-03-30 NOTE — Progress Notes (Signed)
Carelink Summary Report / Loop Recorder 

## 2017-04-10 LAB — CUP PACEART REMOTE DEVICE CHECK
Implantable Pulse Generator Implant Date: 20171005
MDC IDC SESS DTM: 20181129224004

## 2017-04-30 ENCOUNTER — Ambulatory Visit (INDEPENDENT_AMBULATORY_CARE_PROVIDER_SITE_OTHER): Payer: Medicare Other | Admitting: *Deleted

## 2017-04-30 DIAGNOSIS — I639 Cerebral infarction, unspecified: Secondary | ICD-10-CM

## 2017-04-30 NOTE — Progress Notes (Signed)
Carelink Summary Report / Loop Recorder 

## 2017-05-10 LAB — CUP PACEART REMOTE DEVICE CHECK
Implantable Pulse Generator Implant Date: 20171005
MDC IDC SESS DTM: 20181229230837

## 2017-05-28 ENCOUNTER — Ambulatory Visit (INDEPENDENT_AMBULATORY_CARE_PROVIDER_SITE_OTHER): Payer: Medicare Other | Admitting: *Deleted

## 2017-05-28 DIAGNOSIS — I639 Cerebral infarction, unspecified: Secondary | ICD-10-CM

## 2017-05-29 NOTE — Progress Notes (Signed)
Carelink Summary Report / Loop Recorder 

## 2017-06-07 LAB — CUP PACEART REMOTE DEVICE CHECK
Implantable Pulse Generator Implant Date: 20171005
MDC IDC SESS DTM: 20190129003944

## 2017-06-27 ENCOUNTER — Telehealth: Payer: Self-pay | Admitting: Cardiology

## 2017-06-27 NOTE — Telephone Encounter (Signed)
LMOVM requesting that pt send manual transmission b/c home monitor has not updated in at least 14 days.    

## 2017-06-29 ENCOUNTER — Other Ambulatory Visit: Payer: Self-pay | Admitting: Internal Medicine

## 2017-07-02 ENCOUNTER — Ambulatory Visit (INDEPENDENT_AMBULATORY_CARE_PROVIDER_SITE_OTHER): Payer: Medicare Other | Admitting: *Deleted

## 2017-07-02 DIAGNOSIS — I639 Cerebral infarction, unspecified: Secondary | ICD-10-CM

## 2017-07-02 NOTE — Progress Notes (Signed)
Carelink Summary Report / Loop Recorder 

## 2017-07-16 ENCOUNTER — Telehealth: Payer: Self-pay | Admitting: Cardiology

## 2017-07-16 NOTE — Telephone Encounter (Signed)
Spoke w/ pt and informed her that her home monitor is currently working and up to date.

## 2017-07-23 ENCOUNTER — Other Ambulatory Visit: Payer: Self-pay | Admitting: Internal Medicine

## 2017-08-02 ENCOUNTER — Ambulatory Visit (INDEPENDENT_AMBULATORY_CARE_PROVIDER_SITE_OTHER): Payer: Medicare Other | Admitting: *Deleted

## 2017-08-02 DIAGNOSIS — I639 Cerebral infarction, unspecified: Secondary | ICD-10-CM

## 2017-08-06 NOTE — Progress Notes (Signed)
Carelink Summary Report / Loop Recorder 

## 2017-08-07 LAB — CUP PACEART REMOTE DEVICE CHECK
Date Time Interrogation Session: 20190303010812
MDC IDC PG IMPLANT DT: 20171005

## 2017-09-04 ENCOUNTER — Ambulatory Visit (INDEPENDENT_AMBULATORY_CARE_PROVIDER_SITE_OTHER): Payer: Medicare Other | Admitting: *Deleted

## 2017-09-04 DIAGNOSIS — I639 Cerebral infarction, unspecified: Secondary | ICD-10-CM

## 2017-09-05 LAB — CUP PACEART REMOTE DEVICE CHECK
Date Time Interrogation Session: 20190405013912
Implantable Pulse Generator Implant Date: 20171005

## 2017-09-05 NOTE — Progress Notes (Signed)
Carelink Summary Report / Loop Recorder 

## 2017-09-06 ENCOUNTER — Other Ambulatory Visit: Payer: Self-pay | Admitting: Internal Medicine

## 2017-09-25 LAB — CUP PACEART REMOTE DEVICE CHECK
Date Time Interrogation Session: 20190508013945
Implantable Pulse Generator Implant Date: 20171005

## 2017-10-08 ENCOUNTER — Ambulatory Visit (INDEPENDENT_AMBULATORY_CARE_PROVIDER_SITE_OTHER): Payer: Medicare Other | Admitting: *Deleted

## 2017-10-08 DIAGNOSIS — I639 Cerebral infarction, unspecified: Secondary | ICD-10-CM | POA: Diagnosis not present

## 2017-10-08 NOTE — Progress Notes (Signed)
Carelink Summary Report / Loop Recorder 

## 2017-10-30 ENCOUNTER — Telehealth: Payer: Self-pay | Admitting: Cardiology

## 2017-10-30 NOTE — Telephone Encounter (Signed)
LMOVM requesting that pt send manual transmission b/c home monitor has not updated in at least 14 days.    

## 2017-11-09 ENCOUNTER — Ambulatory Visit (INDEPENDENT_AMBULATORY_CARE_PROVIDER_SITE_OTHER): Payer: Medicare Other | Admitting: *Deleted

## 2017-11-09 DIAGNOSIS — I639 Cerebral infarction, unspecified: Secondary | ICD-10-CM

## 2017-11-12 NOTE — Progress Notes (Signed)
Carelink Summary Report / Loop Recorder 

## 2017-11-13 LAB — CUP PACEART REMOTE DEVICE CHECK
Implantable Pulse Generator Implant Date: 20171005
MDC IDC SESS DTM: 20190610020523

## 2017-11-15 ENCOUNTER — Other Ambulatory Visit: Payer: Self-pay | Admitting: Internal Medicine

## 2017-11-15 ENCOUNTER — Telehealth: Payer: Self-pay | Admitting: *Deleted

## 2017-11-15 NOTE — Telephone Encounter (Signed)
Manual transmission received. 4 "AF" episodes appear SR with PACs and some PAT. Will review with Dr. Johney FrameAllred.

## 2017-11-15 NOTE — Telephone Encounter (Signed)
Spoke with patient regarding sending manual transmission in order to review "AF" episodes. 1 available ECG appears SR with PACs. Patient states she will send a manual transmission.

## 2017-11-21 NOTE — Telephone Encounter (Signed)
Dr. Graciela HusbandsKlein reviewed ECGs on 11/20/17.  He agreed that ECGs show SR w/PACs and brief disorganized atrial activity.

## 2017-11-22 ENCOUNTER — Other Ambulatory Visit: Payer: Self-pay | Admitting: Internal Medicine

## 2017-12-12 ENCOUNTER — Ambulatory Visit (INDEPENDENT_AMBULATORY_CARE_PROVIDER_SITE_OTHER): Payer: Medicare Other | Admitting: *Deleted

## 2017-12-12 DIAGNOSIS — I639 Cerebral infarction, unspecified: Secondary | ICD-10-CM

## 2017-12-13 NOTE — Progress Notes (Signed)
Carelink Summary Report / Loop Recorder 

## 2017-12-18 ENCOUNTER — Telehealth: Payer: Self-pay | Admitting: Cardiology

## 2017-12-18 NOTE — Telephone Encounter (Signed)
LMOVM requesting that pt send manual transmission b/c home monitor has not updated in at least 14 days.    

## 2017-12-20 LAB — CUP PACEART REMOTE DEVICE CHECK
Date Time Interrogation Session: 20190713020641
Implantable Pulse Generator Implant Date: 20171005

## 2018-01-09 ENCOUNTER — Telehealth: Payer: Self-pay | Admitting: *Deleted

## 2018-01-09 NOTE — Telephone Encounter (Signed)
LVMOM regarding sending manual transmission. 2 AF episodes noted-- available ECG appears false, SR with ectopy. Gave [facility] phone number to call back.

## 2018-01-10 NOTE — Telephone Encounter (Signed)
Patient called back and I informed her to send a manual transmission. After attempting to help pt trouble shoot her monitor I instructed her to call tech support for further help trouble shooting her home monitor.

## 2018-01-10 NOTE — Telephone Encounter (Signed)
Manual transmission received. 2 "AF" episodes appear SR with atrial ectopy. Will place in Dr. Amedeo PlentyAllreds folder for review.

## 2018-01-14 ENCOUNTER — Ambulatory Visit (INDEPENDENT_AMBULATORY_CARE_PROVIDER_SITE_OTHER): Payer: Medicare Other | Admitting: *Deleted

## 2018-01-14 DIAGNOSIS — I639 Cerebral infarction, unspecified: Secondary | ICD-10-CM | POA: Diagnosis not present

## 2018-01-15 ENCOUNTER — Other Ambulatory Visit: Payer: Self-pay | Admitting: Internal Medicine

## 2018-01-15 NOTE — Progress Notes (Signed)
Carelink Summary Report / Loop Recorder 

## 2018-01-16 ENCOUNTER — Telehealth: Payer: Self-pay | Admitting: *Deleted

## 2018-01-16 NOTE — Telephone Encounter (Signed)
Manual transmission received. Will review ECGs with Dr. Johney FrameAllred on 01/17/18.

## 2018-01-16 NOTE — Telephone Encounter (Signed)
LMOM requesting manual Carelink transmission for review. Gave direct DC phone number for questions/concerns.  Received alert for 2 "AF" episodes. Will review full episodes when manual transmission received.

## 2018-01-17 LAB — CUP PACEART REMOTE DEVICE CHECK
Implantable Pulse Generator Implant Date: 20171005
MDC IDC SESS DTM: 20190815020740

## 2018-01-18 ENCOUNTER — Other Ambulatory Visit: Payer: Self-pay | Admitting: Internal Medicine

## 2018-01-23 NOTE — Telephone Encounter (Signed)
ECGs reviewed by Dr. Johney FrameAllred on 01/17/18. "AF" episode from 9/16 at 0120 is probable AF per Dr. Johney FrameAllred, 12min duration. Plan to continue monitoring for now. If trend continues, plan to discuss OAC.

## 2018-01-27 LAB — CUP PACEART REMOTE DEVICE CHECK
Implantable Pulse Generator Implant Date: 20171005
MDC IDC SESS DTM: 20190917044126

## 2018-01-28 ENCOUNTER — Encounter: Payer: Self-pay | Admitting: *Deleted

## 2018-01-28 NOTE — Progress Notes (Signed)
Received alert for 3 "AF" episodes. Episode #61 appears SR w/PACs and nonsustained atrial arrhythmias. Episodes #62 and #63 are indeterminate. See ECGs below. Episode #59 from 01/14/18 (see scanned media) was thought to be probable AF, duration.     Routed to Dr. Johney Frame for review and recommendations.     12 minute episode if AF noted.  I have not seen her in several years.  Please schedule office visit with EP PA to evaluate for anticoagulation candidacy.    Hillis Range MD, [facility] 02/10/2018 8:09 PM

## 2018-01-31 ENCOUNTER — Other Ambulatory Visit: Payer: Self-pay | Admitting: Internal Medicine

## 2018-02-11 NOTE — Progress Notes (Signed)
Cardiology Office Note Date:  02/12/2018  Patient ID:  Kayla Wells, Kayla Wells 1929-07-01, MRN [id number] PCP:  Adrian Prince, MD  Cardiologist:  Dr. Eden Emms Electrophysiologist: Dr. Johney Frame (ILR implant Sep 16, 2015)    Chief Complaint: abnormal Loop transmission  History of Present Illness: Kayla Wells is a 84 y.o. female with history of recurrent TIA (pt reprots 3 total) >>> ILR implanted, ILD, GERD, HTN.  She comes in today to discuss findings on loop transmission.  She feels like she is doing OK.  Her husband died in 09-16-22 after 3 years in Hospice care, being a widow has been a difficult transition, though she feels she is doing ok. She mentions she sees pulmonary care and they have historically (a year ago) recommended O2 though she never feels SOB and has never started it.  She intends on f/u with them now that her life is starting ito settle down some.  She denies any cardiac awareness, no CP, palpitations.  She will infrequently feels fleetingly lightheaded with standing, though no near syncope or syncope.  She denies any hematological/bleeding history, mentions she bruises easily in her older years.  She reports a couple falls 2 years ago, these sounded mechanical/trip with a swing in her yard that has since been taken down.  She uses a cane with ambulation.  She reports using her "PRN" BP pill about 3 days a week.  Mentions a couple months ago her PMD decreasing her synthroid.   Device information: MDT ILR, implanted 02/03/16, recurrent TIA    Past Medical History:  Diagnosis Date  . Arthritis   . Cervical stenosis (uterine cervix)   . Esophageal stricture   . GERD (gastroesophageal reflux disease)   . Hypothyroidism   . Osteopenia   . Other and unspecified hyperlipidemia   . Pulmonary fibrosis (HCC)   . Unspecified essential hypertension     Past Surgical History:  Procedure Laterality Date  . ABDOMINAL HYSTERECTOMY  1978  . EP IMPLANTABLE DEVICE N/A 02/03/2016   Procedure:  Loop Recorder Insertion;  Surgeon: Hillis Range, MD;  Location: [facility];  Service: Cardiovascular;  Laterality: N/A;  . foot susrgery    . NASAL SINUS SURGERY    . REVISION TOTAL KNEE ARTHROPLASTY    . rotator cuff surgery Left 2014  . TOTAL HIP ARTHROPLASTY     right  . VESICOVAGINAL FISTULA CLOSURE W/ TAH  1978    Current Outpatient Medications  Medication Sig Dispense Refill  . acetaminophen (TYLENOL) 500 MG tablet Take 500-1,000 mg by mouth every 6 (six) hours as needed for mild pain (depends on pain).     Marland Kitchen ALPRAZolam (XANAX) 0.25 MG tablet Take 0.25 mg by mouth daily as needed. For anxiety    . aspirin EC 81 MG tablet Take 81 mg by mouth daily.    . Azilsartan-Chlorthalidone 40-25 MG TABS Take 0.5 mg by mouth daily as needed (BP>140).     . B Complex Vitamins (B-COMPLEX/B-12 PO) Take 1 tablet by mouth daily.    . fluticasone (FLONASE) 50 MCG/ACT nasal spray Place 2 sprays into the nose every evening.     Marland Kitchen levothyroxine (SYNTHROID, LEVOTHROID) 137 MCG tablet Take 137 mcg by mouth daily before breakfast.    . oxymetazoline (AFRIN) 0.05 % nasal spray Place 2 sprays into the nose every evening.     . polyethylene glycol (MIRALAX / GLYCOLAX) packet Take 17 g by mouth daily as needed.    . rosuvastatin (CRESTOR) 10 MG tablet  Take 10 mg by mouth daily.      . traMADol (ULTRAM) 50 MG tablet Take by mouth every 6 (six) hours as needed.     No current facility-administered medications for this visit.     Allergies:   Fish oil and Macrodantin [nitrofurantoin macrocrystal]   Social History:  The patient  reports that she has never smoked. She has never used smokeless tobacco. She reports that she does not drink alcohol or use drugs.   Family History:  The patient's family history includes Heart disease in her father; Stroke in her father; Tuberculosis in her maternal grandfather.  ROS:  Please see the history of present illness.  All other systems are reviewed and otherwise  negative.   PHYSICAL EXAM:  VS:  BP 132/90   Pulse 63   Ht 4\' 11"  (1.499 m)   Wt 137 lb (62.1 kg)   BMI 27.67 kg/m  BMI: Body mass index is 27.67 kg/m. Well nourished, well developed, in no acute distress, thin body habitus  HEENT: normocephalic, atraumatic  Neck: no JVD, carotid bruits or masses Cardiac:  RRR; no significant murmurs, no rubs, or gallops Lungs:  CTA b/l, no wheezing, rhonchi or rales  Abd: soft, nontender, MS: no deformity, age appropriate atrophy Ext: no edema  Skin: warm and dry, no rash Neuro:  No gross deficits appreciated Psych: euthymic mood, full affect  ILR site is stable, no tethering or discomfort   EKG:  Done today shows SR 63bpm, LAD, poor R progression ILR interrogation: battery is good, SR today, R waves 0.72mV. 69 AF episodes. EGMs available for review are evaluated. Some look like SR with PACs 2 look like AFibl/flutter Episodes are brief, seconds to 23 minutes.  No EGMs with longer episodes available  01/27/16: TTE Study Conclusions - Left ventricle: The cavity size was normal. Wall thickness was   normal. Systolic function was normal. The estimated ejection   fraction was in the range of 55% to 60%. Wall motion was normal;   there were no regional wall motion abnormalities. Doppler   parameters are consistent with abnormal left ventricular   relaxation (grade 1 diastolic dysfunction). - Aortic valve: Trileaflet; mildly thickened, moderately calcified   leaflets. - Aorta: Ascending aortic diameter: 41 mm (S). - Ascending aorta: The ascending aorta was mildly dilated. - Mitral valve: Moderately calcified annulus. There was trivial   regurgitation. Impressions: - No cardiac source of emboli was indentified.  Recent Labs: No results found for requested labs within last 8760 hours.  No results found for requested labs within last 8760 hours.   CrCl cannot be calculated (Patient's most recent lab result is older than the maximum 21 days  allowed.).   Wt Readings from Last 3 Encounters:  02/12/18 137 lb (62.1 kg)  12/21/16 140 lb (63.5 kg)  11/10/16 134 lb 3.2 oz (60.9 kg)     Other studies reviewed: Additional studies/records reviewed today include: summarized above  ASSESSMENT AND PLAN:  1. TIA hx >> loop implant 2017 2. PAFib noted on loop      CHA2DS2Vasc is 5 I discussed her risk score, hx of TIA's, with the patient (in lay-man's terms), AFib mechanism of clot/stroke and rational for anticoagulation.  Discussed potential risks/benefits of anticoagulation.  She state understanding.  I hesitant, wonders if her recent stress has anything to do with it.    She is hesitant, is concerned about potential cost of the medicine and would like to have an opportunity to discuss with  her PMD. I gave her the names of Eliquis and xarelto as the 2 medicines we tend to use here.  She will look into the cost and discuss with her PMD as well his recommendations.  Labs from her PMD dated 12/24/17 Creat 0.7 H/H 15/48 TSH 0.02 (pt reports her synthroid was reduced)  I have made a 6 week f/u for her here, she will let us know what her decision is with her PMD.   Disposition: as above  Current medicines are reviewed at length with the patient today.  The patient did not have any concerns regarding medicines  Signed, Francis Dowse, PA-C 02/12/2018 3:53 PM     [facility] [address] [place] [address] [phone] (office)  [phone] (fax)

## 2018-02-12 ENCOUNTER — Ambulatory Visit (INDEPENDENT_AMBULATORY_CARE_PROVIDER_SITE_OTHER): Payer: Medicare Other | Admitting: Physician Assistant

## 2018-02-12 VITALS — BP 132/90 | HR 63 | Ht 59.0 in | Wt 137.0 lb

## 2018-02-12 DIAGNOSIS — I480 Paroxysmal atrial fibrillation: Secondary | ICD-10-CM

## 2018-02-12 DIAGNOSIS — I10 Essential (primary) hypertension: Secondary | ICD-10-CM

## 2018-02-12 NOTE — Patient Instructions (Addendum)
Medication Instructions:   Your physician recommends that you continue on your current medications as directed. Please refer to the Current Medication list given to you today.   If you need a refill on your cardiac medications before your next appointment, please call your pharmacy.  Labwork: NONE ORDERED  TODAY    Testing/Procedures: NONE ORDERED  TODAY    Follow-Up:  IN  6 WEEKS  WITH URSUY    Remote monitoring is used to monitor your Pacemaker of ICD from home. This monitoring reduces the number of office visits required to check your device to one time per year. It allows [place] to keep an eye on the functioning of your device to ensure it is working properly. You are scheduled for a device check from home on..02-18-18  You may send your transmission at any time that day. If you have a wireless device, the transmission will be sent automatically. After your physician reviews your transmission, you will receive a postcard with your next transmission date.  Any Other Special Instructions Will Be Listed Below (If Applicable).  PLEASE DISCUSS WITH YOUR PRIMARY CARE DOCTOR AND INSURANCE FOR COVERAGE AND COST FOR XARELTO OR  ELIQUIS FOR YOUR NEW DIAGNOSIS OF ATRIAL FIBRILLATION

## 2018-02-13 ENCOUNTER — Telehealth: Payer: Self-pay | Admitting: Physician Assistant

## 2018-02-13 NOTE — Telephone Encounter (Signed)
  Pt c/o medication issue: 1. Name of Medication: not prescribed yet  2. How are you currently taking this medication (dosage and times per day)? Na  3. Are you having a reaction (difficulty breathing--STAT)?  Na  4. What is your medication issue?   Patient saw Keitha Butte yesterday and discussed being put on medication. Pt was told to speak with her primary care and let Keitha Butte know if she wanted the medication. She would like for Keitha Butte to go ahead and prescribe medication and patient said she is okay with whatever medication Keitha Butte chooses.

## 2018-02-14 NOTE — Telephone Encounter (Signed)
Attempted to call, no voicemail, will call later.

## 2018-02-14 NOTE — Telephone Encounter (Signed)
Spoke with the patient, she contacted Dr. Rinaldo Cloud office and his nurse stated that our office should handle this decision. The patient said she was willing to start the medication and would need to look over pricing.

## 2018-02-14 NOTE — Telephone Encounter (Signed)
° °  Patient would like to know if she will be prescribed  Xarelto or Eliquis, they are both tier 3 on her insurance plan.

## 2018-02-18 ENCOUNTER — Ambulatory Visit (INDEPENDENT_AMBULATORY_CARE_PROVIDER_SITE_OTHER): Payer: Medicare Other | Admitting: *Deleted

## 2018-02-18 DIAGNOSIS — I639 Cerebral infarction, unspecified: Secondary | ICD-10-CM | POA: Diagnosis not present

## 2018-02-18 NOTE — Progress Notes (Signed)
Carelink Summary Report / Loop Recorder 

## 2018-02-20 MED ORDER — RIVAROXABAN 20 MG PO TABS: 20.0000 mg | ORAL_TABLET | Freq: Every day | ORAL | 6 refills | Status: DC

## 2018-02-20 NOTE — Telephone Encounter (Signed)
SPOKE WITH PT AND PT AWARE OF STOPPING ASPIRIN AND STARTING XARELTO 20 MG ONCE A DAY.  RX:  XARELTO 20 MG ONCE A DAY  SENT TO PHARMACY [facility] [address] 30 TABS 6 REFILLS

## 2018-02-20 NOTE — Telephone Encounter (Signed)
LMOVM TO CONTACT CLINIC BACK FOR CHANGES. wIL CONTACT PT BACK LATER TO MAKE SURE THEY GET MEDICATION CHANGES

## 2018-02-27 ENCOUNTER — Telehealth: Payer: Self-pay

## 2018-02-27 NOTE — Telephone Encounter (Signed)
I did not mean to open phone note. 

## 2018-02-27 NOTE — Telephone Encounter (Signed)
LMOVM reminding pt to send remote transmission.   

## 2018-03-13 LAB — CUP PACEART REMOTE DEVICE CHECK
Date Time Interrogation Session: 20191020043637
Implantable Pulse Generator Implant Date: 20171005

## 2018-03-15 ENCOUNTER — Other Ambulatory Visit: Payer: Self-pay | Admitting: Internal Medicine

## 2018-03-22 ENCOUNTER — Ambulatory Visit (INDEPENDENT_AMBULATORY_CARE_PROVIDER_SITE_OTHER): Payer: Medicare Other

## 2018-03-22 DIAGNOSIS — I639 Cerebral infarction, unspecified: Secondary | ICD-10-CM

## 2018-03-22 NOTE — Progress Notes (Signed)
Carelink Summary Report / Loop Recorder 

## 2018-04-04 ENCOUNTER — Encounter: Payer: Medicare Other | Admitting: Physician Assistant

## 2018-04-14 NOTE — Progress Notes (Signed)
Cardiology Office Note Date:  04/14/2018  Patient ID:  Kayla Wells, Kayla Wells 1930-04-13, MRN [id number] PCP:  Adrian Prince, MD  Cardiologist:  Dr. Eden Emms Electrophysiologist: Dr. Johney Frame (ILR implant 2015-10-02)    Chief Complaint: abnormal Loop transmission  History of Present Illness: Kayla Wells is a 84 y.o. female with history of recurrent TIA (pt reprots 3 total) >>> ILR implanted, ILD, GERD, HTN.  I saw her 02/12/18 to discuss findings on loop transmission.  She felt like she was doing OK.  Her husband died in October 02, 2022 after 3 years in Hospice care, being a widow has been a difficult transition, though felt like she was doing ok. She mentioned she sees pulmonary care and they have historically (a year ago) recommended O2 though she never feels SOB and has never started it.  She intended on f/u with them now that her life is starting ito settle down some.  She denied any cardiac awareness, no CP, palpitations.  She reported infrequently feeling fleetingly lightheaded with standing, though no near syncope or syncope.  She denied any hematological/bleeding history, mentions she bruises easily in her older years.  She reported a couple falls 2 years ago, these sounded mechanical/trip with a swing in her yard that has since been taken down.  She uses a cane with ambulation. She reported using her "PRN" BP pill about 3 days a week.  Mentions a couple months ago her PMD decreasing her synthroid.  Her CHA2DS2Vasc score of 5 I discussed her risk score, hx of TIA's, with the patient (in lay-man's terms), AFib mechanism of clot/stroke and rational for anticoagulation.  Discussed potential risks/benefits of anticoagulation.  She state understanding.  I hesitant, wonders if her recent stress has anything to do with it.   She was hesitant, is concerned about potential cost of the medicine and would like to have an opportunity to discuss with her PMD. I gave her the names of Eliquis and xarelto as the 2  medicines we tend to use here.  She planned look into the cost and discuss with her PMD as well his recommendations.  She has since been started on Xarelto, her ASA stopped.  She is doing well, this is her 1st Christmas sine her husband's death and finds herself a little emotional at times, her BP up more then usual but normal for the most part.  She is tolerating the xarelto, has nicked herself while cutting vegetables and feels like she bleed longer then usual, no other bleeding that she has observed.  No CP, palpitations or SOB, she does her ADLs without difficulty.  Has started a weekly exercsie class at a local senior center.   Device information: MDT ILR, implanted 02/03/16, recurrent TIA    Past Medical History:  Diagnosis Date  . Arthritis   . Cervical stenosis (uterine cervix)   . Esophageal stricture   . GERD (gastroesophageal reflux disease)   . Hypothyroidism   . Osteopenia   . Other and unspecified hyperlipidemia   . Pulmonary fibrosis (HCC)   . Unspecified essential hypertension     Past Surgical History:  Procedure Laterality Date  . ABDOMINAL HYSTERECTOMY  1978  . EP IMPLANTABLE DEVICE N/A 02/03/2016   Procedure: Loop Recorder Insertion;  Surgeon: Hillis Range, MD;  Location: [facility];  Service: Cardiovascular;  Laterality: N/A;  . foot susrgery    . NASAL SINUS SURGERY    . REVISION TOTAL KNEE ARTHROPLASTY    . rotator cuff surgery Left  2014  . TOTAL HIP ARTHROPLASTY     right  . VESICOVAGINAL FISTULA CLOSURE W/ TAH  1978    Current Outpatient Medications  Medication Sig Dispense Refill  . acetaminophen (TYLENOL) 500 MG tablet Take 500-1,000 mg by mouth every 6 (six) hours as needed for mild pain (depends on pain).     Marland Kitchen. ALPRAZolam (XANAX) 0.25 MG tablet Take 0.25 mg by mouth daily as needed. For anxiety    . Azilsartan-Chlorthalidone 40-25 MG TABS Take 0.5 mg by mouth daily as needed (BP>140).     . B Complex Vitamins (B-COMPLEX/B-12 PO) Take 1  tablet by mouth daily.    . fluticasone (FLONASE) 50 MCG/ACT nasal spray Place 2 sprays into the nose every evening.     Marland Kitchen. levothyroxine (SYNTHROID, LEVOTHROID) 137 MCG tablet Take 137 mcg by mouth daily before breakfast.    . oxymetazoline (AFRIN) 0.05 % nasal spray Place 2 sprays into the nose every evening.     . polyethylene glycol (MIRALAX / GLYCOLAX) packet Take 17 g by mouth daily as needed.    . rivaroxaban (XARELTO) 20 MG TABS tablet Take 1 tablet (20 mg total) by mouth daily with supper. 30 tablet 6  . rosuvastatin (CRESTOR) 10 MG tablet Take 10 mg by mouth daily.      . traMADol (ULTRAM) 50 MG tablet Take by mouth every 6 (six) hours as needed.     No current facility-administered medications for this visit.     Allergies:   Fish oil and Macrodantin [nitrofurantoin macrocrystal]   Social History:  The patient  reports that she has never smoked. She has never used smokeless tobacco. She reports that she does not drink alcohol or use drugs.   Family History:  The patient's family history includes Heart disease in her father; Stroke in her father; Tuberculosis in her maternal grandfather.  ROS:  Please see the history of present illness.  All other systems are reviewed and otherwise negative.   PHYSICAL EXAM:  VS:  There were no vitals taken for this visit. BMI: There is no height or weight on file to calculate BMI. Well nourished, well developed, in no acute distress, thin body habitus  HEENT: normocephalic, atraumatic  Neck: no JVD, carotid bruits or masses Cardiac:  RRR; no significant murmurs, no rubs, or gallops Lungs:  CTA b/l, no wheezing, rhonchi or rales  Abd: soft, nontender, MS: no deformity, age appropriate atrophy Ext: no edema  Skin: warm and dry, no rash Neuro:  No gross deficits appreciated Psych: euthymic mood, full affect  ILR site is stable, no tethering or discomfort   EKG:  Not done today ILR interrogation: battery and R waves are good, <1% AF  burden with some short episodes  01/27/16: TTE Study Conclusions - Left ventricle: The cavity size was normal. Wall thickness was   normal. Systolic function was normal. The estimated ejection   fraction was in the range of 55% to 60%. Wall motion was normal;   there were no regional wall motion abnormalities. Doppler   parameters are consistent with abnormal left ventricular   relaxation (grade 1 diastolic dysfunction). - Aortic valve: Trileaflet; mildly thickened, moderately calcified   leaflets. - Aorta: Ascending aortic diameter: 41 mm (S). - Ascending aorta: The ascending aorta was mildly dilated. - Mitral valve: Moderately calcified annulus. There was trivial   regurgitation. Impressions: - No cardiac source of emboli was indentified.  Recent Labs: No results found for requested labs within last 8760 hours.  No results found for requested labs within last 8760 hours.   CrCl cannot be calculated (Patient's most recent lab result is older than the maximum 21 days allowed.).   Wt Readings from Last 3 Encounters:  02/12/18 137 lb (62.1 kg)  12/21/16 140 lb (63.5 kg)  11/10/16 134 lb 3.2 oz (60.9 kg)     Other studies reviewed: Additional studies/records reviewed today include: summarized above  ASSESSMENT AND PLAN:  1. TIA hx >> loop implant 2017 2. PAFib noted on loop     CHA2DS2Vasc is 5, on Xarelto, appropriately dosed     get H/H today new to a/c     Labs from her PMD dated 12/24/17 Creat 0.7 H/H 15/48 TSH 0.02 (pt reports her synthroid was reduced)  She mentions likely will need a tooth extraction soon, she will let us know, will not want her off her Xarelto for much more then a day given her h/o TIA  3. High blood pressure     She will keep an eye on this, not typically high, but more emotional of late     She will follow with her PMD for BP if becomes routinely elevated.   Disposition: will continue monthly reports to watch her AF burden, see her back in 6  months otherwise, sooner if needed.  Current medicines are reviewed at length with the patient today.  The patient did not have any concerns regarding medicines  Signed, Francis Dowse, PA-C 04/14/2018 10:26 AM     [facility] [address] [place] [address] [phone] (office)  [phone] (fax)

## 2018-04-16 ENCOUNTER — Ambulatory Visit: Payer: Medicare Other | Admitting: Physician Assistant

## 2018-04-16 VITALS — BP 154/82 | HR 63 | Ht 60.0 in | Wt 140.0 lb

## 2018-04-16 DIAGNOSIS — I480 Paroxysmal atrial fibrillation: Secondary | ICD-10-CM | POA: Diagnosis not present

## 2018-04-16 DIAGNOSIS — Z4509 Encounter for adjustment and management of other cardiac device: Secondary | ICD-10-CM | POA: Diagnosis not present

## 2018-04-16 DIAGNOSIS — Z79899 Other long term (current) drug therapy: Secondary | ICD-10-CM | POA: Diagnosis not present

## 2018-04-16 LAB — HEMOGLOBIN AND HEMATOCRIT, BLOOD
Hematocrit: 41.8 % (ref 34.0–46.6)
Hemoglobin: 14.3 g/dL (ref 11.1–15.9)

## 2018-04-16 NOTE — Patient Instructions (Signed)
Medication Instructions:   Your physician recommends that you continue on your current medications as directed. Please refer to the Current Medication list given to you today.  If you need a refill on your cardiac medications before your next appointment, please call your pharmacy.   Lab work:  H AND H TODAY   If you have labs (blood work) drawn today and your tests are completely normal, you will receive your results only by: Marland Kitchen. MyChart Message (if you have MyChart) OR . A paper copy in the mail If you have any lab test that is abnormal or we need to change your treatment, we will call you to review the results.   Testing/Procedures: NONE ORDERED  TODAY    Follow-Up: [facility], you and your health needs are our priority.  As part of our continuing mission to provide you with exceptional heart care, we have created designated Provider Care Teams.  These Care Teams include your primary Cardiologist (physician) and Advanced Practice Providers (APPs -  Physician Assistants and Nurse Practitioners) who all work together to provide you with the care you need, when you need it. You will need a follow up appointment in 6 months.  Please call our office 2 months in advance to schedule this appointment.  You may see Hillis RangeJames Allred, MD  or one of the following Advanced Practice Providers on your designated Care Team:   Gypsy BalsamAmber Seiler, NP . Francis Dowseenee Ursuy, PA-C  Remote monitoring is used to monitor your Pacemaker of ICD from home. This monitoring reduces the number of office visits required to check your device to one time per year. It allows us to keep an eye on the functioning of your device to ensure it is working properly. You are scheduled for a device check from home on . 07-16-2018 You may send your transmission at any time that day. If you have a wireless device, the transmission will be sent automatically. After your physician reviews your transmission, you will receive a postcard with your  next transmission date.    Any Other Special Instructions Will Be Listed Below (If Applicable).

## 2018-04-24 LAB — CUP PACEART REMOTE DEVICE CHECK
Implantable Pulse Generator Implant Date: 20171005
MDC IDC SESS DTM: 20191225111045

## 2018-04-25 ENCOUNTER — Ambulatory Visit (INDEPENDENT_AMBULATORY_CARE_PROVIDER_SITE_OTHER): Payer: Medicare Other

## 2018-04-25 DIAGNOSIS — I639 Cerebral infarction, unspecified: Secondary | ICD-10-CM | POA: Diagnosis not present

## 2018-04-25 NOTE — Progress Notes (Signed)
Carelink Summary Report / Loop Recorder 

## 2018-05-12 LAB — CUP PACEART REMOTE DEVICE CHECK
Implantable Pulse Generator Implant Date: 20171005
MDC IDC SESS DTM: 20191122091233

## 2018-05-27 ENCOUNTER — Ambulatory Visit (INDEPENDENT_AMBULATORY_CARE_PROVIDER_SITE_OTHER): Payer: Medicare Other

## 2018-05-27 DIAGNOSIS — I639 Cerebral infarction, unspecified: Secondary | ICD-10-CM

## 2018-05-28 LAB — CUP PACEART REMOTE DEVICE CHECK
MDC IDC PG IMPLANT DT: 20171005
MDC IDC SESS DTM: 20200127124238

## 2018-05-28 NOTE — Progress Notes (Signed)
Carelink Summary Report / Loop Recorder 

## 2018-06-13 ENCOUNTER — Encounter: Payer: Self-pay | Admitting: Cardiovascular Disease

## 2018-06-26 NOTE — Progress Notes (Signed)
Cardiology Office Note   Date:  07/01/2018   ID:  [name], DOB Mar 16, 1930, MRN [id number]  PCP:  Adrian Prince, MD  Cardiologist:   Johney Frame  No chief complaint on file.     History of Present Illness: Kayla Wells is a 85 y.o. female who presents for f/u PAF. I have not seen her in years Primarily followed by Dr Graciela Husbands And Dr Johney Frame She has a history of cryptogenic stroke with ILR discovering PAF in 2012. Has been on NOAC  She has ILD and needs home oxygen but has declined Husband died in 10-09-2022 after being in Hospice for 3 years. CHADVASC 8 She is on synthroid replacement with TSH suppressed on 12/24/17 and dose reduced. BP has been running higher Since husbands death   Has had pain in LLE with edema and LE varicosities Needs to see foot doctor for valgus deformity 5 th toe  Living independently with one son that lives next door  Needs f/u with Dr Delton Coombes willing to wear oxygen for her ILD     Past Medical History:  Diagnosis Date  . Arthritis   . Cervical stenosis (uterine cervix)   . Esophageal stricture   . GERD (gastroesophageal reflux disease)   . Hypothyroidism   . Osteopenia   . Other and unspecified hyperlipidemia   . Pulmonary fibrosis (HCC)   . Unspecified essential hypertension     Past Surgical History:  Procedure Laterality Date  . ABDOMINAL HYSTERECTOMY  1978  . EP IMPLANTABLE DEVICE N/A 02/03/2016   Procedure: Loop Recorder Insertion;  Surgeon: Hillis Range, MD;  Location: [facility];  Service: Cardiovascular;  Laterality: N/A;  . foot susrgery    . NASAL SINUS SURGERY    . REVISION TOTAL KNEE ARTHROPLASTY    . rotator cuff surgery Left 2014  . TOTAL HIP ARTHROPLASTY     right  . VESICOVAGINAL FISTULA CLOSURE W/ TAH  1978     Current Outpatient Medications  Medication Sig Dispense Refill  . acetaminophen (TYLENOL) 500 MG tablet Take 500-1,000 mg by mouth every 6 (six) hours as needed for mild pain (depends on pain).     Marland Kitchen  ALPRAZolam (XANAX) 0.25 MG tablet Take 0.25 mg by mouth daily as needed. For anxiety    . Azilsartan-Chlorthalidone 40-25 MG TABS Take 0.5 mg by mouth daily as needed (BP>140).     . B Complex Vitamins (B-COMPLEX/B-12 PO) Take 1 tablet by mouth daily.    . fluticasone (FLONASE) 50 MCG/ACT nasal spray Place 2 sprays into the nose every evening.     Marland Kitchen levothyroxine (SYNTHROID, LEVOTHROID) 137 MCG tablet Take 137 mcg by mouth daily before breakfast.    . oxymetazoline (AFRIN) 0.05 % nasal spray Place 2 sprays into the nose every evening.     . polyethylene glycol (MIRALAX / GLYCOLAX) packet Take 17 g by mouth daily as needed.    . rivaroxaban (XARELTO) 20 MG TABS tablet Take 1 tablet (20 mg total) by mouth daily with supper. 30 tablet 6  . rosuvastatin (CRESTOR) 10 MG tablet Take 10 mg by mouth daily.      . traMADol (ULTRAM) 50 MG tablet Take by mouth every 6 (six) hours as needed.     No current facility-administered medications for this visit.     Allergies:   Fish oil and Macrodantin [nitrofurantoin macrocrystal]    Social History:  The patient  reports that she has never smoked. She has never used smokeless  tobacco. She reports that she does not drink alcohol or use drugs.   Family History:  The patient's family history includes Heart disease in her father; Stroke in her father; Tuberculosis in her maternal grandfather.    ROS:  Please see the history of present illness.   Otherwise, review of systems are positive for none.   All other systems are reviewed and negative.    PHYSICAL EXAM: VS:  BP 130/76   Pulse 70   Ht 5' (1.524 m)   Wt 64.5 kg   SpO2 96%   BMI 27.77 kg/m  , BMI Body mass index is 27.77 kg/m. Affect appropriate Healthy:  appears stated age HEENT: normal Neck supple with no adenopathy JVP normal no bruits no thyromegaly Lungs velcro crackles no wheezing and good diaphragmatic motion Heart:  S1/S2 no murmur, no rub, gallop or click PMI normal Abdomen:  benighn, BS positve, no tenderness, no AAA no bruit.  No HSM or HJR Distal pulses intact with no bruits No edema Neuro non-focal Skin warm and dry No muscular weakness    EKG:  02/12/18 SR rate 78 poor R wave progression    Recent Labs: 04/16/2018: Hemoglobin 14.3    Lipid Panel No results found for: CHOL, TRIG, HDL, CHOLHDL, VLDL, LDLCALC, LDLDIRECT    Wt Readings from Last 3 Encounters:  07/01/18 64.5 kg  04/16/18 63.5 kg  02/12/18 62.1 kg      Other studies Reviewed: Additional studies/ records that were reviewed today include: Notes from EP labs ECG.Multiple PACEART device checks    ASSESSMENT AND PLAN:  1.  PAF:  Maintaining NSR continue xarelto 2.  HTN:  Well controlled.  Continue current medications and low sodium Dash type diet.   3. Thyroid:  F/u with primary for repeat TSH/T4 on lower dose synthroid 4. HLD:  On statin labs with primary  5. COPD/ILD :  F/u [place] willing to wear nocturnal oxygen 6. LLE pain  [place] to r/o DVT less likely on xarelto f/u with primary to refer to foot doctor/podiatry    Current medicines are reviewed at length with the patient today.  The patient does not have concerns regarding medicines.  The following changes have been made:  no change  Labs/ tests ordered today include: LLE [place] r/o DVT  No orders of the defined types were placed in this encounter.    Disposition:   FU with EP in 6 months Dr Johney Frame      Signed, Charlton Haws, MD  07/01/2018 11:22 AM    [facility] [address] [place], [place], [place]  [address] Phone: [phone]; Fax: [phone]

## 2018-06-29 LAB — CUP PACEART REMOTE DEVICE CHECK
Implantable Pulse Generator Implant Date: 20171005
MDC IDC SESS DTM: 20200229131057

## 2018-07-01 ENCOUNTER — Encounter: Payer: Self-pay | Admitting: Cardiovascular Disease

## 2018-07-01 ENCOUNTER — Telehealth: Payer: Self-pay

## 2018-07-01 ENCOUNTER — Ambulatory Visit: Payer: Medicare Other | Admitting: Cardiovascular Disease

## 2018-07-01 ENCOUNTER — Ambulatory Visit (INDEPENDENT_AMBULATORY_CARE_PROVIDER_SITE_OTHER): Payer: Medicare Other | Admitting: *Deleted

## 2018-07-01 ENCOUNTER — Other Ambulatory Visit: Payer: Self-pay | Admitting: Cardiovascular Disease

## 2018-07-01 VITALS — BP 130/76 | HR 70 | Ht 60.0 in | Wt 142.2 lb

## 2018-07-01 DIAGNOSIS — I639 Cerebral infarction, unspecified: Secondary | ICD-10-CM

## 2018-07-01 DIAGNOSIS — J8410 Pulmonary fibrosis, unspecified: Secondary | ICD-10-CM

## 2018-07-01 DIAGNOSIS — E785 Hyperlipidemia, unspecified: Secondary | ICD-10-CM

## 2018-07-01 DIAGNOSIS — M79662 Pain in left lower leg: Secondary | ICD-10-CM | POA: Diagnosis not present

## 2018-07-01 DIAGNOSIS — R600 Localized edema: Secondary | ICD-10-CM

## 2018-07-01 DIAGNOSIS — I480 Paroxysmal atrial fibrillation: Secondary | ICD-10-CM | POA: Diagnosis not present

## 2018-07-01 DIAGNOSIS — I10 Essential (primary) hypertension: Secondary | ICD-10-CM

## 2018-07-01 LAB — CUP PACEART REMOTE DEVICE CHECK
Date Time Interrogation Session: 20200302102327
MDC IDC PG IMPLANT DT: 20171005

## 2018-07-01 NOTE — Patient Instructions (Addendum)
Medication Instructions:   If you need a refill on your cardiac medications before your next appointment, please call your pharmacy.   Lab work:  If you have labs (blood work) drawn today and your tests are completely normal, you will receive your results only by: Marland Kitchen MyChart Message (if you have MyChart) OR . A paper copy in the mail If you have any lab test that is abnormal or we need to change your treatment, we will call you to review the results.  Testing/Procedures: Your physician has requested that you have a left lower extremity venous duplex. This test is an ultrasound of the veins in the leg. It looks at venous blood flow that carries blood from the heart to the leg. Allow one hour for a Lower Venous exam. There are no restrictions or special instructions.  Follow-Up: At [facility], you and your health needs are our priority.  As part of our continuing mission to provide you with exceptional heart care, we have created designated Provider Care Teams.  These Care Teams include your primary Cardiologist (physician) and Advanced Practice Providers (APPs -  Physician Assistants and Nurse Practitioners) who all work together to provide you with the care you need, when you need it. You will need a follow up appointment in 6 months.  Please call our office 2 months in advance to schedule this appointment.  You may see Hillis Range, MD or one of the following Advanced Practice Providers on your designated Care Team:   Gypsy Balsam, NP . Francis Dowse, PA-C  Your physician recommends that you schedule a follow-up appointment as needed with Dr. Eden Emms.   You have been referred to Dr. Delton Coombes with pulmonary.

## 2018-07-01 NOTE — Telephone Encounter (Signed)
07-01-18/11:49am/ Pt at check out today. Pt requested I leave scheduling with Dr. Delton Coombes to her. Pt stated she felt the referral was to place her on oxygen, and she was not ready for that./renee val-check-out

## 2018-07-03 ENCOUNTER — Ambulatory Visit (HOSPITAL_COMMUNITY)
Admission: RE | Admit: 2018-07-03 | Discharge: 2018-07-03 | Disposition: A | Discharge status: Home / Self Care | Payer: Medicare Other | Source: Ambulatory Visit | Attending: Cardiovascular Disease | Admitting: Cardiovascular Disease

## 2018-07-03 DIAGNOSIS — R600 Localized edema: Secondary | ICD-10-CM | POA: Diagnosis present

## 2018-07-03 DIAGNOSIS — M79662 Pain in left lower leg: Secondary | ICD-10-CM | POA: Diagnosis present

## 2018-07-05 ENCOUNTER — Institutional Professional Consult (permissible substitution): Payer: Medicare Other | Admitting: Emergency Medicine

## 2018-07-08 NOTE — Progress Notes (Signed)
Carelink Summary Report / Loop Recorder 

## 2018-07-31 ENCOUNTER — Ambulatory Visit: Payer: Medicare Other | Admitting: Emergency Medicine

## 2018-08-01 ENCOUNTER — Ambulatory Visit (INDEPENDENT_AMBULATORY_CARE_PROVIDER_SITE_OTHER): Payer: Medicare Other | Admitting: *Deleted

## 2018-08-01 ENCOUNTER — Other Ambulatory Visit: Payer: Self-pay

## 2018-08-01 DIAGNOSIS — I639 Cerebral infarction, unspecified: Secondary | ICD-10-CM

## 2018-08-01 LAB — CUP PACEART REMOTE DEVICE CHECK
Date Time Interrogation Session: 20200402130620
Implantable Pulse Generator Implant Date: 20171005

## 2018-08-07 IMAGING — DX DG HIP (WITH OR WITHOUT PELVIS) 1V PORT*R*
1 series · 1 of 1 positions shown · non-contrast
Comparison: 12/21/2016 at 5605 hours

CLINICAL DATA: Post reduction right hip

EXAM:
DG HIP (WITH OR WITHOUT PELVIS) 1V PORT RIGHT

[pelvis ap]
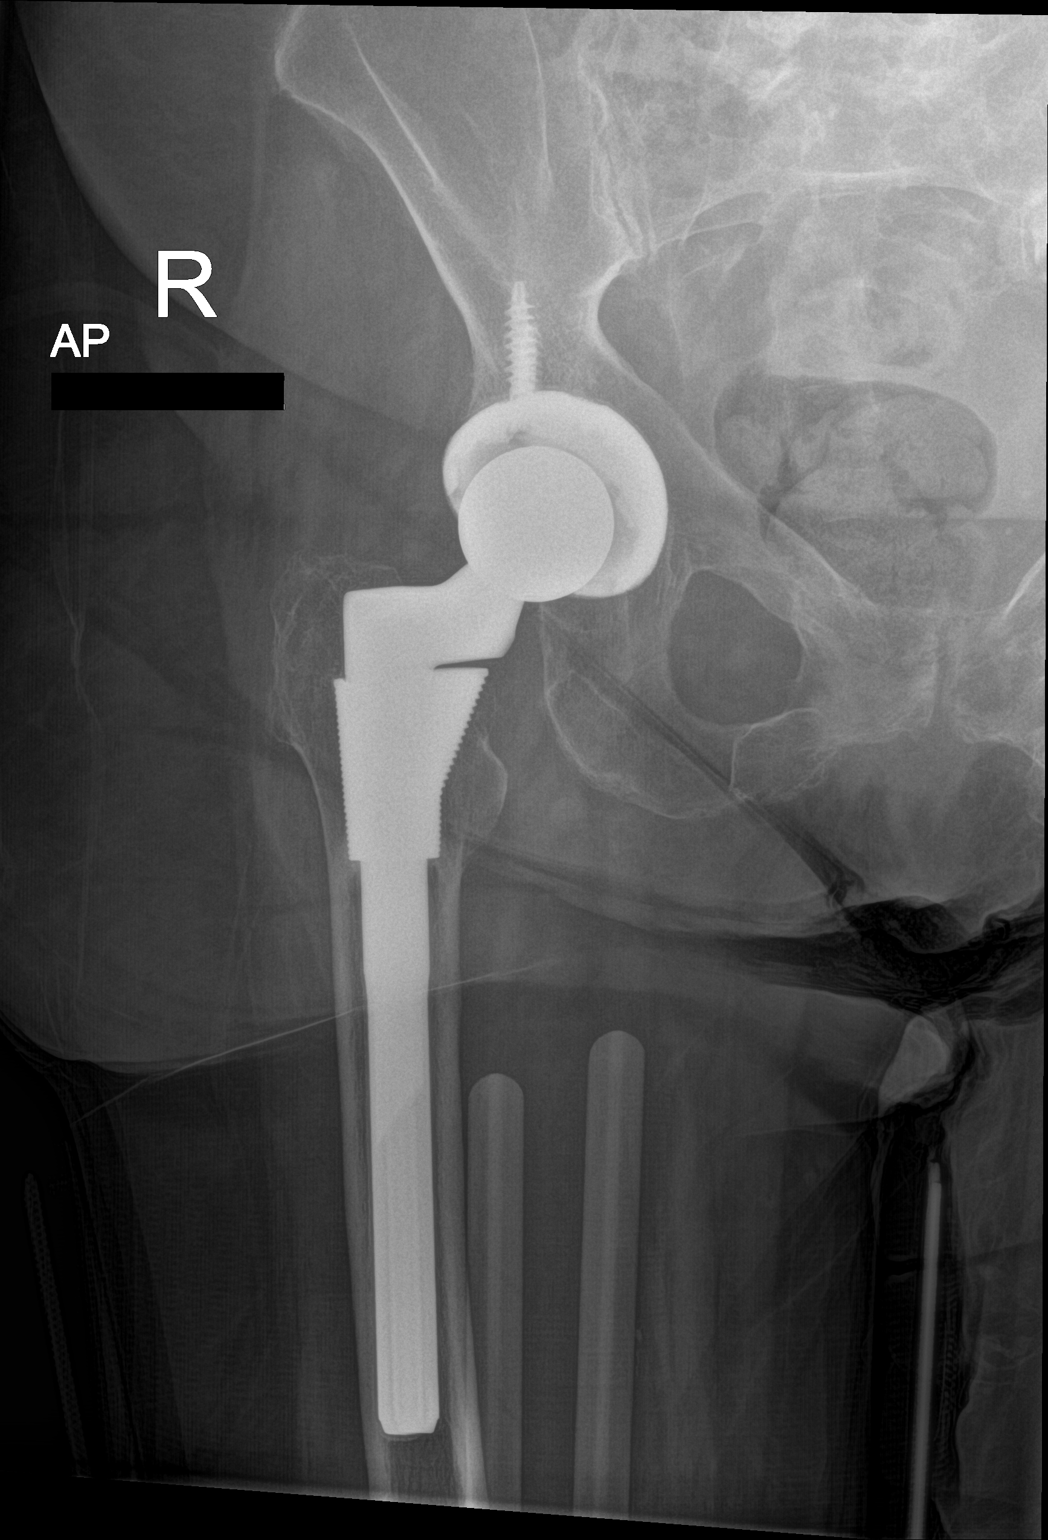

[1 of 1 positions shown; findings below may reference images not displayed]

FINDINGS: Satisfactory reduction of the right hip arthroplasty femoral head
into the acetabular component.

No fracture is seen.

Visualized bony pelvis appears intact.
IMPRESSION: Satisfactory reduction of the right total hip arthroplasty, as
above.

## 2018-08-07 IMAGING — CR DG HIP (WITH OR WITHOUT PELVIS) 2-3V*R*
3 series · 3 of 3 positions shown · non-contrast
Comparison: None.

CLINICAL DATA: Bent over, felt a pop.

EXAM:
DG HIP (WITH OR WITHOUT PELVIS) 2-3V RIGHT

[w hip lat right]
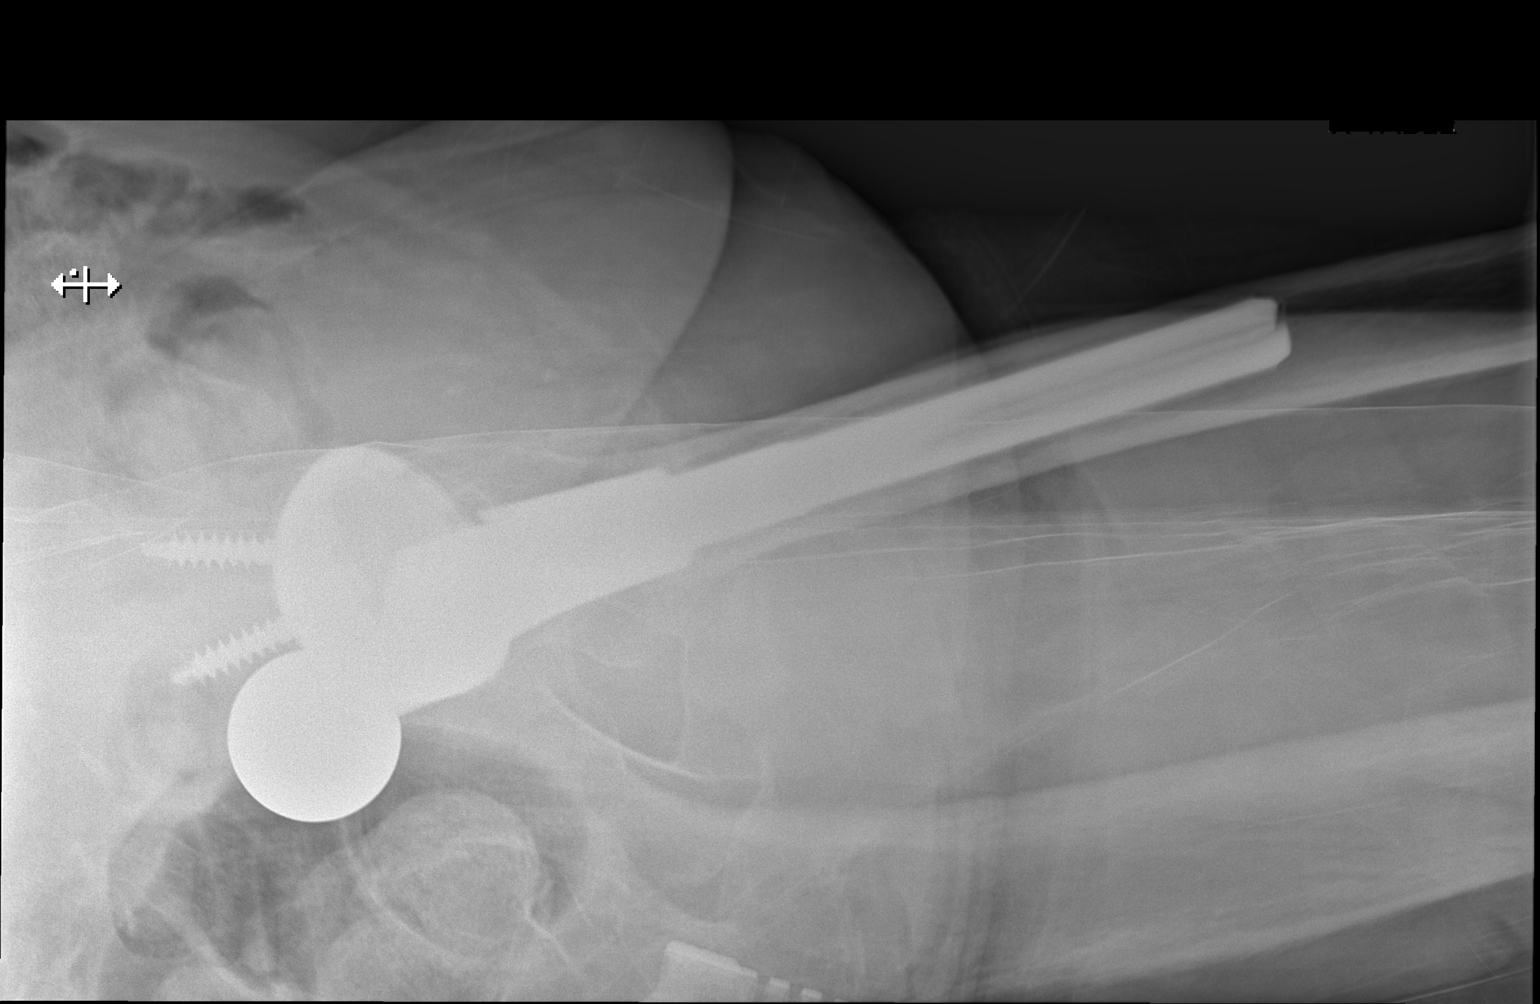

[x pelvis]
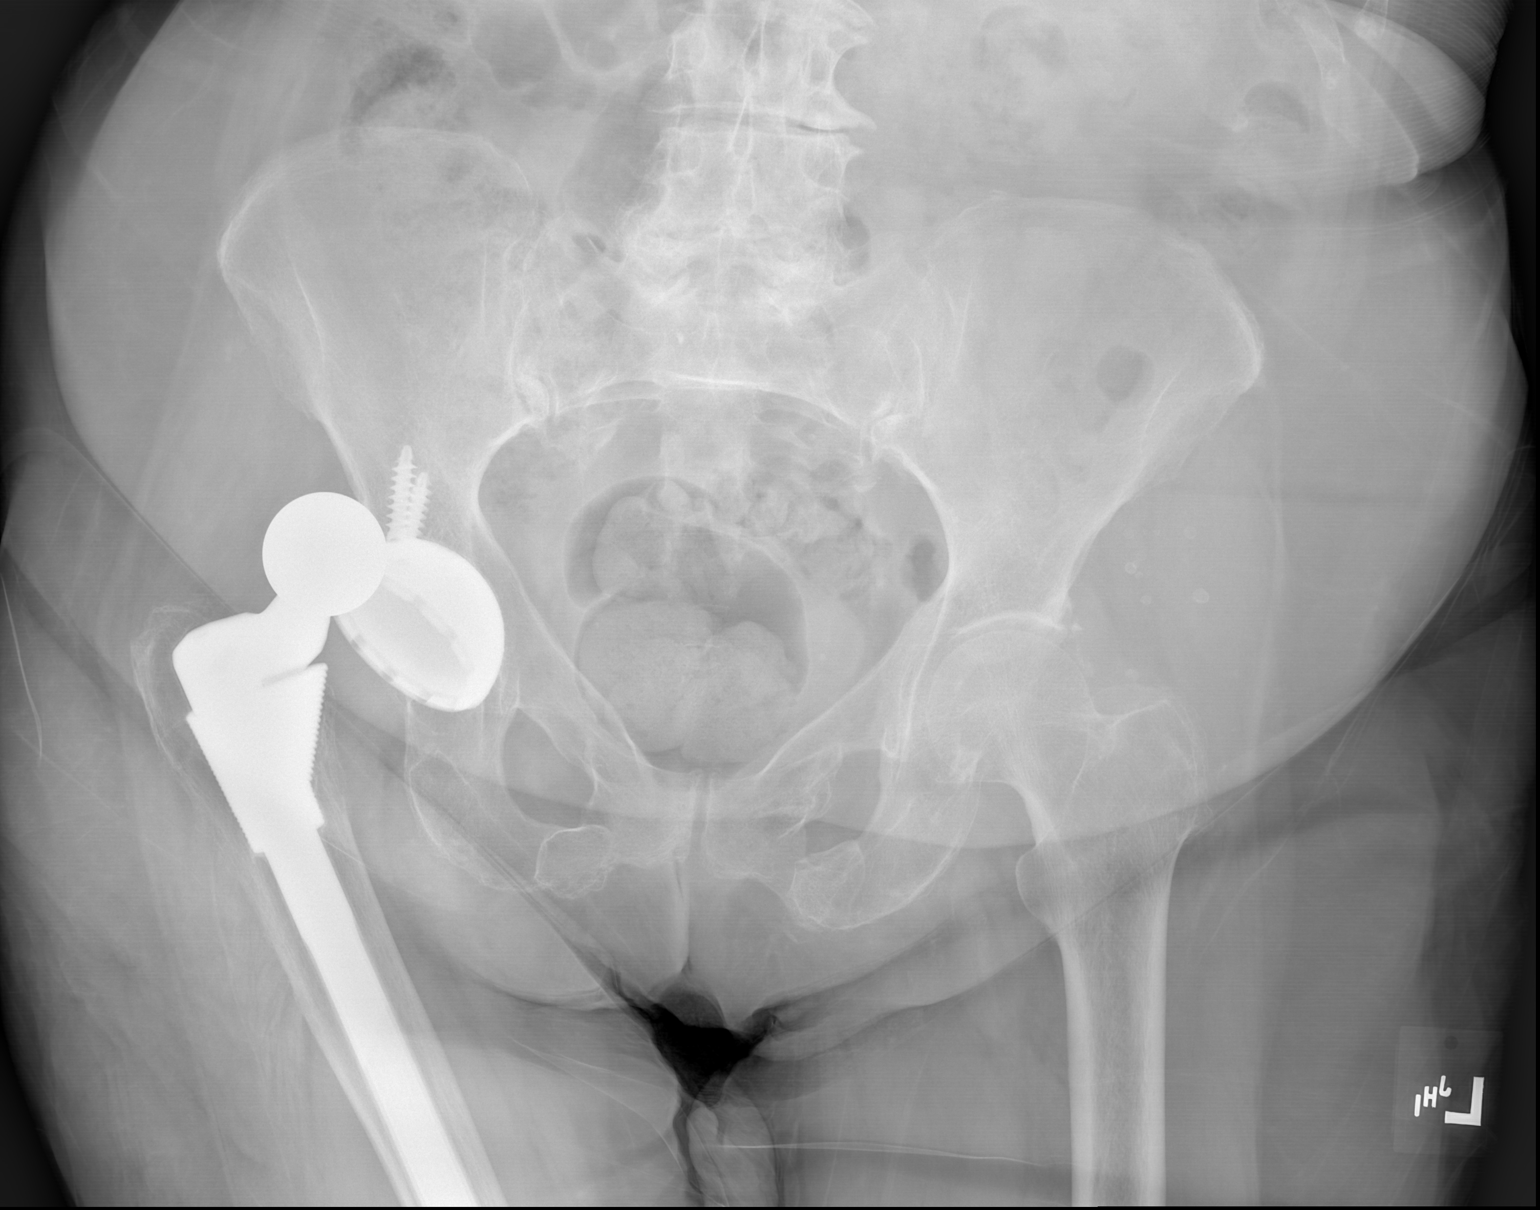

[x hip ap right]
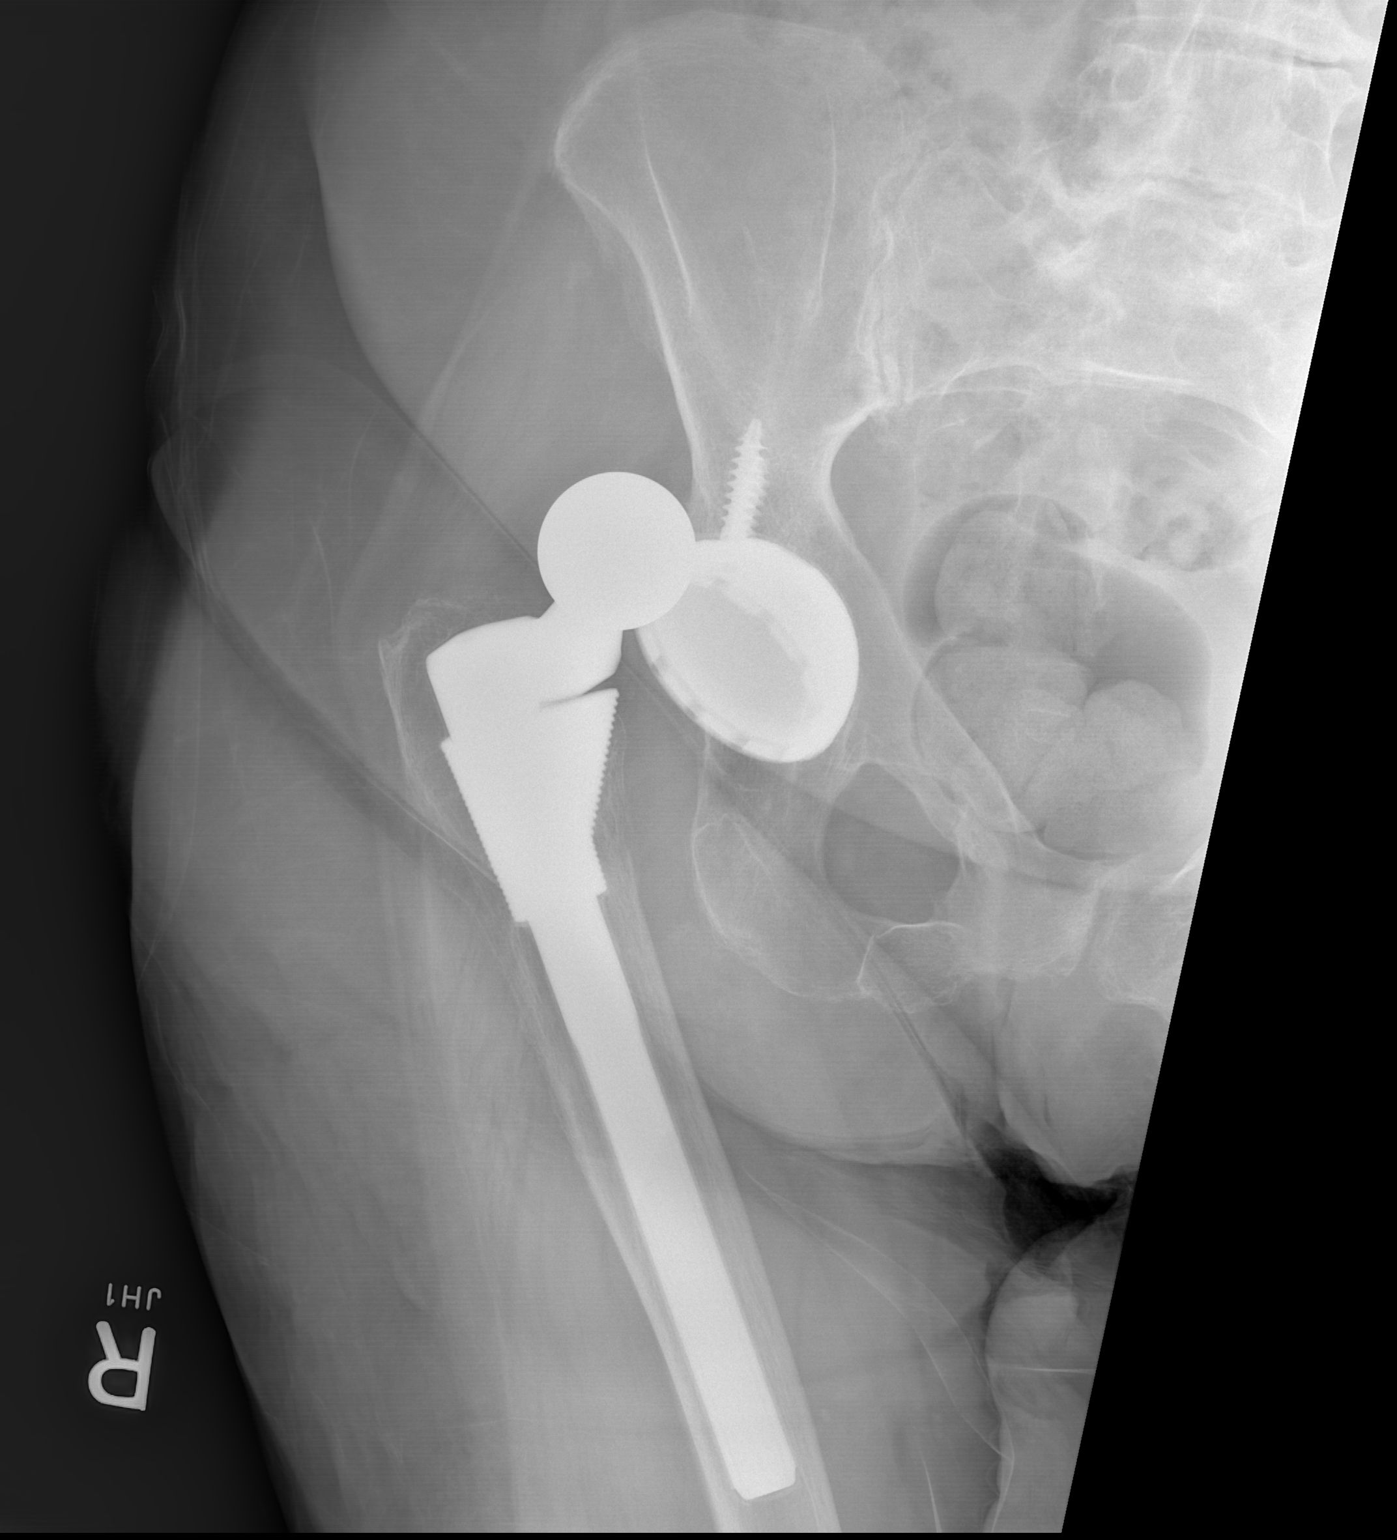

[3 of 3 positions shown; findings below may reference images not displayed]

FINDINGS: Status post RIGHT hip total arthroplasty. Dislocated femoral
component projecting superior to the acetabular cup. No acute
fracture deformity. Old Bilateral superior and inferior pubic rami
fractures. Osteopenia. Scattered injection granulomas.
IMPRESSION: Dislocated RIGHT hip total arthroplasty.

## 2018-08-08 NOTE — Progress Notes (Signed)
Carelink Summary Report / Loop Recorder 

## 2018-08-11 ENCOUNTER — Other Ambulatory Visit: Payer: Self-pay | Admitting: Physician Assistant

## 2018-08-12 NOTE — Telephone Encounter (Signed)
Xarelto 20mg  refill request received; pt is 85 yrs old, wt-64.5kg, Crea-0.70 via [facility] on 12/25/2017, last seen by Dr. Eden Emms on 07/01/2018, CrCl-59ml/min; will send in refill to requested pharmacy.

## 2018-09-03 ENCOUNTER — Ambulatory Visit (INDEPENDENT_AMBULATORY_CARE_PROVIDER_SITE_OTHER): Payer: Medicare Other | Admitting: *Deleted

## 2018-09-03 ENCOUNTER — Other Ambulatory Visit: Payer: Self-pay

## 2018-09-03 DIAGNOSIS — I639 Cerebral infarction, unspecified: Secondary | ICD-10-CM | POA: Diagnosis not present

## 2018-09-03 LAB — CUP PACEART REMOTE DEVICE CHECK
Date Time Interrogation Session: 20200505144209
Implantable Pulse Generator Implant Date: 20171005

## 2018-09-09 NOTE — Progress Notes (Signed)
Carelink Summary Report / Loop Recorder 

## 2018-10-07 ENCOUNTER — Ambulatory Visit (INDEPENDENT_AMBULATORY_CARE_PROVIDER_SITE_OTHER): Payer: Medicare Other | Admitting: *Deleted

## 2018-10-07 DIAGNOSIS — I639 Cerebral infarction, unspecified: Secondary | ICD-10-CM | POA: Diagnosis not present

## 2018-10-07 LAB — CUP PACEART REMOTE DEVICE CHECK
Date Time Interrogation Session: 20200607153929
Implantable Pulse Generator Implant Date: 20171005

## 2018-10-14 NOTE — Progress Notes (Signed)
Carelink Summary Report / Loop Recorder 

## 2018-10-31 ENCOUNTER — Ambulatory Visit: Payer: Medicare Other | Admitting: Emergency Medicine

## 2018-11-08 ENCOUNTER — Ambulatory Visit (INDEPENDENT_AMBULATORY_CARE_PROVIDER_SITE_OTHER): Payer: Medicare Other | Admitting: *Deleted

## 2018-11-08 DIAGNOSIS — I639 Cerebral infarction, unspecified: Secondary | ICD-10-CM | POA: Diagnosis not present

## 2018-11-08 LAB — CUP PACEART REMOTE DEVICE CHECK
Date Time Interrogation Session: 20200710163543
Implantable Pulse Generator Implant Date: 20171005

## 2018-11-12 ENCOUNTER — Other Ambulatory Visit: Payer: Self-pay

## 2018-11-12 ENCOUNTER — Encounter: Payer: Self-pay | Admitting: Emergency Medicine

## 2018-11-12 ENCOUNTER — Ambulatory Visit: Payer: Medicare Other | Admitting: Emergency Medicine

## 2018-11-12 DIAGNOSIS — J8410 Pulmonary fibrosis, unspecified: Secondary | ICD-10-CM

## 2018-11-12 DIAGNOSIS — J849 Interstitial pulmonary disease, unspecified: Secondary | ICD-10-CM | POA: Diagnosis not present

## 2018-11-12 NOTE — Progress Notes (Signed)
Carelink Summary Report / Loop Recorder 

## 2018-11-12 NOTE — Patient Instructions (Addendum)
We will perform a repeat high resolution CT chest to compare with 2017 We will perform a walking oximetry today. If you have low oxygen levels we will arrange to get oxygen to use at home.  Follow with Dr Lamonte Sakai next available after your scan to review the results

## 2018-11-12 NOTE — Progress Notes (Signed)
Subjective:    Patient ID: Kayla Wells, female    DOB: [birth year], 85 y.o.   MRN: [id number]  HPI   ROV 11/12/2018 --Kayla Wells is 19, reestablishing care for ILD.  She has a history of dysphasia and possible chronic aspiration that may have been a contributor.  She is also been on nitrofurantoin in the past, has a borderline positive ANA (1: 160).  Her last CT scan of the chest was 03/10/2016, showed a UIP pattern with slow progression compared with 2010.  She has had desaturations before but has not stayed on chronic oxygen.   Other past medical history significant for paroxysmal atrial fibrillation, CVA, hypothyroidism  She describes fairly stable breathing but notes that she doesn't exert very much. She does have SOB when she does her exercise classes - hasn't done these for a few months. She is still having dysphagia, food gets stuck and she can get choked. No associated cough.    Review of Systems  Constitutional: Positive for unexpected weight change. Negative for fever.  HENT: Positive for congestion, dental problem and trouble swallowing. Negative for ear pain, nosebleeds, postnasal drip, rhinorrhea, sinus pressure, sneezing and sore throat.   Eyes: Negative for redness and itching.  Respiratory: Positive for cough and shortness of breath. Negative for chest tightness and wheezing.   Cardiovascular: Negative for palpitations and leg swelling.  Gastrointestinal: Negative for nausea and vomiting.  Genitourinary: Negative for dysuria.  Musculoskeletal: Negative for joint swelling.  Skin: Negative for rash.  Neurological: Negative for headaches.  Hematological: Does not bruise/bleed easily.  Psychiatric/Behavioral: Negative for dysphoric mood. The patient is nervous/anxious.     Past Medical History:  Diagnosis Date  . Arthritis   . Cervical stenosis (uterine cervix)   . Esophageal stricture   . GERD (gastroesophageal reflux disease)   . Hypothyroidism   . Osteopenia   .  Other and unspecified hyperlipidemia   . Pulmonary fibrosis ([place])   . Unspecified essential hypertension      Family History  Problem Relation Age of Onset  . Heart disease Father   . Stroke Father   . Tuberculosis Maternal Grandfather   . Atopy Neg Hx      Social History   Socioeconomic History  . Marital status: Married    Spouse name: Max  . Number of children: 2  . Years of education: 19  . Highest education level: Not on file  Occupational History  . Occupation: retired  Scientific laboratory technician  . Financial resource strain: Not on file  . Food insecurity    Worry: Not on file    Inability: Not on file  . Transportation needs    Medical: Not on file    Non-medical: Not on file  Tobacco Use  . Smoking status: Never Smoker  . Smokeless tobacco: Never Used  Substance and Sexual Activity  . Alcohol use: No  . Drug use: No  . Sexual activity: Not on file  Lifestyle  . Physical activity    Days per week: Not on file    Minutes per session: Not on file  . Stress: Not on file  Relationships  . Social Herbalist on phone: Not on file    Gets together: Not on file    Attends religious service: Not on file    Active member of club or organization: Not on file    Attends meetings of clubs or organizations: Not on file    Relationship status: Not  on file  . Intimate partner violence    Fear of current or ex partner: Not on file    Emotionally abused: Not on file    Physically abused: Not on file    Forced sexual activity: Not on file  Other Topics Concern  . Not on file  Social History Narrative   Lives with husband   Caffeine- coffee, tea     Allergies  Allergen Reactions  . Fish Oil   . Macrodantin [Nitrofurantoin Macrocrystal] Other (See Comments)    Advised by MD not to take this med anymore     Outpatient Medications Prior to Visit  Medication Sig Dispense Refill  . acetaminophen (TYLENOL) 500 MG tablet Take 500-1,000 mg by mouth every 6 (six) hours  as needed for mild pain (depends on pain).     Marland Kitchen. ALPRAZolam (XANAX) 0.25 MG tablet Take 0.25 mg by mouth daily as needed. For anxiety    . Azilsartan-Chlorthalidone 40-25 MG TABS Take 0.5 mg by mouth daily as needed (BP>140).     . B Complex Vitamins (B-COMPLEX/B-12 PO) Take 1 tablet by mouth daily.    . fluticasone (FLONASE) 50 MCG/ACT nasal spray Place 2 sprays into the nose every evening.     . hydrochlorothiazide (MICROZIDE) 12.5 MG capsule Take 12.5 mg by mouth daily.    . irbesartan (AVAPRO) 150 MG tablet Take 150 mg by mouth daily.    Marland Kitchen. levothyroxine (SYNTHROID, LEVOTHROID) 137 MCG tablet Take 137 mcg by mouth daily before breakfast.    . oxymetazoline (AFRIN) 0.05 % nasal spray Place 2 sprays into the nose every evening.     . polyethylene glycol (MIRALAX / GLYCOLAX) packet Take 17 g by mouth daily as needed.    . rosuvastatin (CRESTOR) 10 MG tablet Take 10 mg by mouth daily.      . traMADol (ULTRAM) 50 MG tablet Take by mouth every 6 (six) hours as needed.    Carlena Hurl. XARELTO 20 MG TABS tablet TAKE 1 TABLET (20 MG TOTAL) BY MOUTH DAILY WITH SUPPER. 30 tablet 6   No facility-administered medications prior to visit.         Objective:   Physical Exam Vitals:   11/12/18 1102  BP: 106/72  Pulse: 87  SpO2: 94%  Weight: 140 lb (63.5 kg)  Height: 4' 11.5" (1.511 m)   Gen: Pleasant, elderly woman, in no distress,  normal affect  ENT: No lesions,  mouth clear,  oropharynx clear, no postnasal drip  Neck: No JVD, no stridor  Lungs: No use of accessory muscles, soft bibasilar insp crackles, clear superiorly  Cardiovascular: irregular,  no murmur or gallops, no peripheral edema  Musculoskeletal: No deformities, no cyanosis or clubbing  Neuro: alert, non focal  Skin: Warm, no lesions or rashes      Assessment & Plan:  PULMONARY FIBROSIS ILD POST INFLAMMATORY CHRONIC Etiology unclear but given the very slow progression over several years suspect contribution of intermittent  aspiration.  She does still have some dysphasia.  We will reimage, assess interval change.  Also assess for desaturation since oxygen would be the most important intervention to make if she qualifies.  We will perform a repeat high resolution CT chest to compare with 2017 We will perform a walking oximetry today. If you have low oxygen levels we will arrange to get oxygen to use at home.  Follow with Dr Delton CoombesByrum next available after your scan to review the results   Levy Pupaobert Iven Earnhart, MD, PhD 11/12/2018, 11:35 AM  [facility] [phone] or if no answer [phone]

## 2018-11-12 NOTE — Assessment & Plan Note (Signed)
Etiology unclear but given the very slow progression over several years suspect contribution of intermittent aspiration.  She does still have some dysphasia.  We will reimage, assess interval change.  Also assess for desaturation since oxygen would be the most important intervention to make if she qualifies.  We will perform a repeat high resolution CT chest to compare with 2017 We will perform a walking oximetry today. If you have low oxygen levels we will arrange to get oxygen to use at home.  Follow with Dr Lamonte Sakai next available after your scan to review the results

## 2018-11-19 ENCOUNTER — Ambulatory Visit (INDEPENDENT_AMBULATORY_CARE_PROVIDER_SITE_OTHER)
Admission: RE | Admit: 2018-11-19 | Discharge: 2018-11-19 | Disposition: A | Discharge status: Home / Self Care | Payer: Medicare Other | Source: Ambulatory Visit | Attending: Emergency Medicine | Admitting: Emergency Medicine

## 2018-11-19 ENCOUNTER — Other Ambulatory Visit: Payer: Self-pay

## 2018-11-19 DIAGNOSIS — J849 Interstitial pulmonary disease, unspecified: Secondary | ICD-10-CM | POA: Diagnosis not present

## 2018-11-25 ENCOUNTER — Telehealth: Payer: Self-pay | Admitting: Emergency Medicine

## 2018-11-25 NOTE — Telephone Encounter (Signed)
Pt called requesting to know the results of the HRCT which was performed 7/21. Dr. Lamonte Sakai, please advise on this for pt. Thanks!

## 2018-11-25 NOTE — Telephone Encounter (Signed)
Advised pt of results. Pt understood and nothing further is needed.   

## 2018-11-25 NOTE — Telephone Encounter (Signed)
Please let her know that there is persistent lung scarring present. There has been slight progression since her scan in 2017, but overall stable. No evidence that we should change her medications or do any new testing. Overall good news.

## 2018-12-11 ENCOUNTER — Ambulatory Visit (INDEPENDENT_AMBULATORY_CARE_PROVIDER_SITE_OTHER): Payer: Medicare Other | Admitting: *Deleted

## 2018-12-11 DIAGNOSIS — I639 Cerebral infarction, unspecified: Secondary | ICD-10-CM | POA: Diagnosis not present

## 2018-12-12 LAB — CUP PACEART REMOTE DEVICE CHECK
Date Time Interrogation Session: 20200812173621
Implantable Pulse Generator Implant Date: 20171005

## 2018-12-19 NOTE — Progress Notes (Signed)
Carelink Summary Report / Loop Recorder 

## 2018-12-24 ENCOUNTER — Other Ambulatory Visit: Payer: Self-pay

## 2018-12-24 ENCOUNTER — Ambulatory Visit (INDEPENDENT_AMBULATORY_CARE_PROVIDER_SITE_OTHER): Payer: Medicare Other | Admitting: Emergency Medicine

## 2018-12-24 ENCOUNTER — Encounter: Payer: Self-pay | Admitting: Emergency Medicine

## 2018-12-24 DIAGNOSIS — J8410 Pulmonary fibrosis, unspecified: Secondary | ICD-10-CM | POA: Diagnosis not present

## 2018-12-24 NOTE — Assessment & Plan Note (Signed)
She has had some slight progression in her interstitial changes based on repeat CT over the last 3 years.  That being said is not clear to me that there is been enough clinical change to merit intervention or anti-fibrotic therapy at her age.  She did not desaturate, still has a reasonable exertional tolerance.  I think we can be conservative and follow.  I did talk to her today about the phenomenon of an acute flare, acute exacerbation.  She knows to contact us if she has sudden changes in her clinical status since these may need to be treated.  For now we will plan to repeat her CT every 1 to 2 years, follow-up certainly at least annually, next time July 2021.

## 2018-12-24 NOTE — Progress Notes (Signed)
Virtual Visit via Telephone Note  I connected with Kayla Wells on 12/24/18 at 10:00 AM EDT by telephone and verified that I am speaking with the correct person using two identifiers.  Location: Patient: Home  Provider: Office   I discussed the limitations, risks, security and privacy concerns of performing an evaluation and management service by telephone and the availability of in person appointments. I also discussed with the patient that there may be a patient responsible charge related to this service. The patient expressed understanding and agreed to proceed.   History of Present Illness: Pleasant 85 year old woman with a history of mild interstitial lung disease.  She reestablished care last month.  Etiology of her ILD unclear, she does have some dysphasia, question aspiration.  She is also had a borderline positive ANA in the past.  We repeated her CT scan of the chest on 11/20/2018.   Observations/Objective: CT chest 11/20/2018 reviewed, shows some mild Progression of moderate interstitial changes that are heterogeneous in a UIP pattern, minimal progression compared with 03/10/2016, clearly progressed compared with 2010.  She did not desaturate with ambulation at her visit on 11/12/2018.  Staying in a lot. Does have good exercise tolerance, but has to go slowly. She has to get up at night to urinate, disturbs her sleep.   Assessment and Plan: PULMONARY FIBROSIS ILD POST INFLAMMATORY CHRONIC She has had some slight progression in her interstitial changes based on repeat CT over the last 3 years.  That being said is not clear to me that there is been enough clinical change to merit intervention or anti-fibrotic therapy at her age.  She did not desaturate, still has a reasonable exertional tolerance.  I think we can be conservative and follow.  I did talk to her today about the phenomenon of an acute flare, acute exacerbation.  She knows to contact us if she has sudden changes in her clinical  status since these may need to be treated.  For now we will plan to repeat her CT every 1 to 2 years, follow-up certainly at least annually, next time July 2021.    Follow Up Instructions: 1 year   I discussed the assessment and treatment plan with the patient. The patient was provided an opportunity to ask questions and all were answered. The patient agreed with the plan and demonstrated an understanding of the instructions.   The patient was advised to call back or seek an in-person evaluation if the symptoms worsen or if the condition fails to improve as anticipated.  I provided 15 minutes of non-face-to-face time during this encounter.   Collene Gobble, MD

## 2019-01-13 ENCOUNTER — Ambulatory Visit (INDEPENDENT_AMBULATORY_CARE_PROVIDER_SITE_OTHER): Payer: Medicare Other | Admitting: *Deleted

## 2019-01-13 ENCOUNTER — Encounter: Payer: Medicare Other | Admitting: Internal Medicine

## 2019-01-13 DIAGNOSIS — I639 Cerebral infarction, unspecified: Secondary | ICD-10-CM | POA: Diagnosis not present

## 2019-01-13 LAB — CUP PACEART REMOTE DEVICE CHECK
Date Time Interrogation Session: 20200914173606
Implantable Pulse Generator Implant Date: 20171005

## 2019-01-24 NOTE — Progress Notes (Signed)
Carelink Summary Report / Loop Recorder 

## 2019-01-25 ENCOUNTER — Other Ambulatory Visit: Payer: Self-pay | Admitting: Cardiovascular Disease

## 2019-01-27 ENCOUNTER — Other Ambulatory Visit: Payer: Self-pay

## 2019-01-27 ENCOUNTER — Encounter: Payer: Self-pay | Admitting: Internal Medicine

## 2019-01-27 ENCOUNTER — Telehealth (INDEPENDENT_AMBULATORY_CARE_PROVIDER_SITE_OTHER): Payer: Medicare Other | Admitting: Internal Medicine

## 2019-01-27 VITALS — BP 135/69 | HR 67 | Ht 59.0 in | Wt 149.0 lb

## 2019-01-27 DIAGNOSIS — I480 Paroxysmal atrial fibrillation: Secondary | ICD-10-CM

## 2019-01-27 DIAGNOSIS — I10 Essential (primary) hypertension: Secondary | ICD-10-CM | POA: Diagnosis not present

## 2019-01-27 MED ORDER — RIVAROXABAN 15 MG PO TABS: 15.0000 mg | ORAL_TABLET | Freq: Every day | ORAL | 5 refills | Status: DC

## 2019-01-27 NOTE — Telephone Encounter (Signed)
-----   Message from Josue Hector, MD sent at 01/27/2019  8:47 AM EDT ----- That's fine to lower dose  ----- Message ----- From: Brynda Peon, RN Sent: 01/27/2019   8:19 AM EDT To: Josue Hector, MD  Pt last saw Dr Johnsie Cancel 07/01/18, last labs 11/11/18 Creat 0.9 at [facility], age 85, weight 63.5kg, CrCl 44.15, based on CrCl pt is not on appropriate dosage of Xarelto.  CrCl 15-50 pt should be on Xarelto 15mg  QD.  Will send staff message to Dr Johnsie Cancel to see if dosage reduction is appropriate.  Will await his response.

## 2019-01-27 NOTE — Telephone Encounter (Signed)
Called spoke with pt advised of Xarelto dosage change from 20mg  QD to 15mg  QD given CrCl of 44.15 per Dr Johnsie Cancel. Pt verbalized understanding. Medication list adjusted to reflect change. New rx for Xarelto 15mg  QD sent to pharmacy.

## 2019-01-27 NOTE — Patient Instructions (Signed)
Medication Instructions:  Your physician recommends that you continue on your current medications as directed. Please refer to the Current Medication list given to you today.   Labwork: None ordered   Testing/Procedures: None ordered   Follow-Up: Your physician recommends that you schedule a follow-up appointment in: 12 months with Renee Ursuy, PA   Any Other Special Instructions Will Be Listed Below (If Applicable).     If you need a refill on your cardiac medications before your next appointment, please call your pharmacy.   

## 2019-01-27 NOTE — Telephone Encounter (Signed)
Pt last saw Dr Johnsie Cancel 07/01/18, last labs 11/11/18 Creat 0.9 at [facility], age 85, weight 63.5kg, CrCl 44.15, based on CrCl pt is not on appropriate dosage of Xarelto.  Will send staff message to Dr Johnsie Cancel to see if dosage reduction is appropriate.  Will await his response.

## 2019-01-27 NOTE — Progress Notes (Signed)
Electrophysiology TeleHealth Note  Due to national recommendations of social distancing due to COVID 19, an audio telehealth visit is felt to be most appropriate for this patient at this time.  Verbal consent was obtained by me for the telehealth visit today.  The patient does not have capability for a virtual visit.  A phone visit is therefore required today.   Date:  01/27/2019   ID:  Kayla Wells, DOB August 19, 1929, MRN [id number]  Location: patient's home  Provider location:  [facility]  Evaluation Performed: Follow-up visit  PCP:  Adrian Prince, MD   Electrophysiologist:  Dr Johney Frame  Chief Complaint:  palpitations  History of Present Illness:    Kayla Wells is a 85 y.o. female who presents via telehealth conferencing today.  Since last being seen in our clinic, the patient reports doing very well.  Today, she denies symptoms of palpitations, chest pain, shortness of breath,  lower extremity edema, dizziness, presyncope, or syncope.  No recent afib.  Doing well with xarelto.  The patient is otherwise without complaint today.  The patient denies symptoms of fevers, chills, cough, or new SOB worrisome for COVID 19.  Past Medical History:  Diagnosis Date  . Arthritis   . Cervical stenosis (uterine cervix)   . Esophageal stricture   . GERD (gastroesophageal reflux disease)   . Hypothyroidism   . Osteopenia   . Other and unspecified hyperlipidemia   . Pulmonary fibrosis (HCC)   . Unspecified essential hypertension     Past Surgical History:  Procedure Laterality Date  . ABDOMINAL HYSTERECTOMY  1978  . EP IMPLANTABLE DEVICE N/A 02/03/2016   Procedure: Loop Recorder Insertion;  Surgeon: Hillis Range, MD;  Location: [facility];  Service: Cardiovascular;  Laterality: N/A;  . foot susrgery    . NASAL SINUS SURGERY    . REVISION TOTAL KNEE ARTHROPLASTY    . rotator cuff surgery Left 2014  . TOTAL HIP ARTHROPLASTY     right  . VESICOVAGINAL FISTULA CLOSURE W/ TAH   1978    Current Outpatient Medications  Medication Sig Dispense Refill  . acetaminophen (TYLENOL) 500 MG tablet Take 500-1,000 mg by mouth every 6 (six) hours as needed for mild pain (depends on pain).     Marland Kitchen ALPRAZolam (XANAX) 0.25 MG tablet Take 0.25 mg by mouth daily as needed. For anxiety    . B Complex Vitamins (B-COMPLEX/B-12 PO) Take 1 tablet by mouth daily.    . fluticasone (FLONASE) 50 MCG/ACT nasal spray Place 2 sprays into the nose every evening.     . hydrochlorothiazide (MICROZIDE) 12.5 MG capsule Take 12.5 mg by mouth daily.    . irbesartan (AVAPRO) 150 MG tablet Take 150 mg by mouth daily.    Marland Kitchen levothyroxine (SYNTHROID) 150 MCG tablet Take 150 mcg by mouth daily before breakfast. Monday-Friday    . oxymetazoline (AFRIN) 0.05 % nasal spray Place 2 sprays into the nose every evening.     . polyethylene glycol (MIRALAX / GLYCOLAX) packet Take 17 g by mouth daily as needed for mild constipation.     . rosuvastatin (CRESTOR) 10 MG tablet Take 10 mg by mouth daily.      . traMADol (ULTRAM) 50 MG tablet Take 50 mg by mouth every 6 (six) hours as needed for moderate pain.     Marland Kitchen XARELTO 20 MG TABS tablet TAKE 1 TABLET (20 MG TOTAL) BY MOUTH DAILY WITH SUPPER. 30 tablet 6   No current facility-administered  medications for this visit.     Allergies:   Fish oil and Macrodantin [nitrofurantoin macrocrystal]   Social History:  The patient  reports that she has never smoked. She has never used smokeless tobacco. She reports that she does not drink alcohol or use drugs.   Family History:  The patient's family history includes Heart disease in her father; Stroke in her father; Tuberculosis in her maternal grandfather.   ROS:  Please see the history of present illness.   All other systems are personally reviewed and negative.    Exam:    Vital Signs:  BP 135/69   Pulse 67   Ht 4\' 11"  (1.499 m)   Wt 149 lb (67.6 kg)   BMI 30.09 kg/m   Well sounding, alert and conversant   Labs/Other Tests and Data Reviewed:    Recent Labs: 04/16/2018: Hemoglobin 14.3   Wt Readings from Last 3 Encounters:  01/27/19 149 lb (67.6 kg)  11/12/18 140 lb (63.5 kg)  07/01/18 142 lb 3.2 oz (64.5 kg)     Last device remote is reviewed from [facility] which reveals normal device function, no arrhythmias   ASSESSMENT & PLAN:    1.  Paroxysmal atrial fibrillation Well controlled No AF by recent ILR remote Continue xarleto   2. HTN Stable No change required today  Follow-up:  EP PA In a year  Patient Risk:  after full review of this patients clinical status, I feel that they are at moderate risk at this time.  Today, I have spent 15 minutes with the patient with telehealth technology discussing arrhythmia management .    Army Fossa, MD  01/27/2019 8:40 AM     [facility] [address] [place] [address] [phone] (office) [phone] (fax)

## 2019-02-17 ENCOUNTER — Ambulatory Visit (INDEPENDENT_AMBULATORY_CARE_PROVIDER_SITE_OTHER): Payer: Medicare Other | Admitting: *Deleted

## 2019-02-17 DIAGNOSIS — I639 Cerebral infarction, unspecified: Secondary | ICD-10-CM | POA: Diagnosis not present

## 2019-02-17 LAB — CUP PACEART REMOTE DEVICE CHECK
Date Time Interrogation Session: 20201017173935
Implantable Pulse Generator Implant Date: 20171005

## 2019-03-07 NOTE — Progress Notes (Signed)
Carelink Summary Report / Loop Recorder 

## 2019-03-12 ENCOUNTER — Telehealth: Payer: Self-pay | Admitting: Internal Medicine

## 2019-03-12 NOTE — Telephone Encounter (Signed)
Pt's son came into the office requesting samples of Xarelto 15 mg tablets that someone told them that they could come by the office to pick up. I did not see any documentation concerning this matter. I gave the pt 2 bottles of Xarelto 15 mg tablet and documentation on how to contact the patient assistant program to inquire about getting assistance for Xarelto. I advised pt that if there is any other problems, questions or concerns to call the office. Pt's son verbalized understanding.

## 2019-03-20 ENCOUNTER — Ambulatory Visit (INDEPENDENT_AMBULATORY_CARE_PROVIDER_SITE_OTHER): Payer: Medicare Other | Admitting: *Deleted

## 2019-03-20 DIAGNOSIS — I639 Cerebral infarction, unspecified: Secondary | ICD-10-CM

## 2019-03-20 LAB — CUP PACEART REMOTE DEVICE CHECK
Date Time Interrogation Session: 20201119140457
Implantable Pulse Generator Implant Date: 20171005

## 2019-03-31 ENCOUNTER — Other Ambulatory Visit: Payer: Self-pay | Admitting: Cardiovascular Disease

## 2019-03-31 NOTE — Telephone Encounter (Signed)
Called and spoke to pt who stated that she did not need a refill of Xarelto at this time and that she already spoke to the Drug store and they are refilling her Xarelto 15mg .

## 2019-04-18 NOTE — Progress Notes (Signed)
Carelink Summary Report / Loop Recorder 

## 2019-04-22 ENCOUNTER — Ambulatory Visit (INDEPENDENT_AMBULATORY_CARE_PROVIDER_SITE_OTHER): Payer: Medicare Other | Admitting: *Deleted

## 2019-04-22 DIAGNOSIS — I639 Cerebral infarction, unspecified: Secondary | ICD-10-CM

## 2019-04-22 LAB — CUP PACEART REMOTE DEVICE CHECK
Date Time Interrogation Session: 20201222140051
Implantable Pulse Generator Implant Date: 20171005

## 2019-05-16 ENCOUNTER — Telehealth: Payer: Self-pay | Admitting: Internal Medicine

## 2019-05-16 NOTE — Telephone Encounter (Addendum)
**Note De-Identified Kayla Wells Obfuscation** This was a very difficult call as the pt seems confused about applying for pt asst with [employer] for help with her Xarelto.  She is currently paying $47/30 day supply of Eliquis which is the lowest cost possible through her Medicare plan.  She is concerned and wants to apply now for assistance for when she does go into the donut hole later this year.  I have advised her multiple times to call [employer] pt asst with questions about her 2020 proof of income but she continues to ask me questions that I cannot answer.  She does have her application and her 2019 proof of income so I advised her to mail to [employer] and if they have questions they will reach out to [place] and her.  She is in agreement with the plan.

## 2019-05-16 NOTE — Telephone Encounter (Signed)
Patient calling asking to send in her forms for getting her [employer]. She would like to know if she needs to send in all of the tax forms or if she can just send in what needs to be signed by Dr. Johney Frame.

## 2019-05-26 ENCOUNTER — Ambulatory Visit (INDEPENDENT_AMBULATORY_CARE_PROVIDER_SITE_OTHER): Payer: Medicare Other | Admitting: *Deleted

## 2019-05-26 DIAGNOSIS — I639 Cerebral infarction, unspecified: Secondary | ICD-10-CM

## 2019-05-26 LAB — CUP PACEART REMOTE DEVICE CHECK
Date Time Interrogation Session: 20210124231542
Implantable Pulse Generator Implant Date: 20171005

## 2019-06-30 ENCOUNTER — Ambulatory Visit (INDEPENDENT_AMBULATORY_CARE_PROVIDER_SITE_OTHER): Payer: Medicare Other | Admitting: *Deleted

## 2019-06-30 DIAGNOSIS — I639 Cerebral infarction, unspecified: Secondary | ICD-10-CM

## 2019-06-30 LAB — CUP PACEART REMOTE DEVICE CHECK
Date Time Interrogation Session: 20210228230908
Implantable Pulse Generator Implant Date: 20171005

## 2019-06-30 NOTE — Progress Notes (Signed)
ILR Remote 

## 2019-07-31 ENCOUNTER — Ambulatory Visit (INDEPENDENT_AMBULATORY_CARE_PROVIDER_SITE_OTHER): Payer: Medicare Other | Admitting: *Deleted

## 2019-07-31 DIAGNOSIS — I639 Cerebral infarction, unspecified: Secondary | ICD-10-CM | POA: Diagnosis not present

## 2019-07-31 LAB — CUP PACEART REMOTE DEVICE CHECK
Date Time Interrogation Session: 20210401001913
Implantable Pulse Generator Implant Date: 20171005

## 2019-07-31 NOTE — Progress Notes (Signed)
ILR Remote 

## 2019-08-15 ENCOUNTER — Telehealth: Payer: Self-pay | Admitting: Student

## 2019-08-15 NOTE — Telephone Encounter (Signed)
**Note De-Identified Jakwan Sally Obfuscation** I called [facility] and was advised that the pts ins plan is paying their part for the pts Xarelto and that her cost is $47/30 day supply and that the pt is not in the donut hole and she does not owe a deductible.  I then called the pt who states that she can handle the cost of her Xarelto for now but is concerned about going into the donut hole. I gave her [employer] phone number [phone] to call them with questions concerning her eligibility and to ask them to mail her an application.  She is aware to complete her part of the application, obtain required documents, and to bring all to Dr Amedeo Plenty office to drop off and we will take care of the provider part of the application and fax all into [employer] Pt Asst program so that when she does go in the donut hole [employer] will already have her application.  She is also aware to call me if she needs asst with the application.  She thanked me far calling her to discuss.

## 2019-08-15 NOTE — Telephone Encounter (Signed)
Pt aware that loop is at RRT and monitoring will be discontinued.   Marked as inactive in [facility] and Discontinued from [facility].   Pt states her Xarelto is too expensive. I will reach out and see if any patient assistance is available to her.   Casimiro Needle [address] [place], PA-C  08/15/2019 9:15 AM

## 2019-08-15 NOTE — Telephone Encounter (Signed)
ILR met RRT as of 08/14/2019. Called to inform patient that we will discontinue monitoring and see if she wants device removed or to leave in.   LMOM to CB.

## 2019-08-25 ENCOUNTER — Telehealth: Payer: Self-pay

## 2019-08-25 NOTE — Telephone Encounter (Signed)
Patient assistance application received. Placing in Lynn's box. 08/25/19 vlm

## 2019-08-25 NOTE — Telephone Encounter (Signed)
**Note De-Identified Harmani Neto Obfuscation** Please do not place in my mail bin as I am not in the office and am working remotely. Please give to [facility] and she will email it to me. Thank you!

## 2019-08-26 NOTE — Telephone Encounter (Signed)
The pt left her completed [employer] Pt asst application for Xarelto and all needed documents at the office. I have completed the provider page, scanned and emailed all to Dr Amedeo Plenty nurse so she obtain Dr Amedeo Plenty signature, date, and to fax all to [employer] Pt Asst program at the phone number written on cover letter included.

## 2019-08-27 NOTE — Telephone Encounter (Signed)
**Note De-Identified Ossiel Marchio Obfuscation** No Answer so I left a VM message stating that we have completed her [employer] pt asst application but that Dr Johney Frame will not be in the office again until 09/07/19 and will sign her application then. I will offer her Xarelto 15 mg samples if she is running low on her supply.

## 2019-08-27 NOTE — Telephone Encounter (Signed)
Pt called returning a call from [place], LPN, and I informed her per Larita Fife, LPN, that Dr. Johney Frame will not be back in the office until 09/07/19, for him to sign the pt assistance application and then she will be able to fax it to the company. I asked pt if she was ok with her medication until this is process, pt stated yes. I advised the pt that if she needs any medication to give our office a call so we can provide her with medication until this matter is resolved. Pt verbalized understanding. FYI

## 2019-09-02 ENCOUNTER — Other Ambulatory Visit: Payer: Self-pay | Admitting: Cardiovascular Disease

## 2019-09-02 NOTE — Telephone Encounter (Signed)
Xarelto 15mg  refill request received. Pt is 86 years old, weight-67.6kg, Crea-0.90 on 11/11/2018 via [facility] from [facility], last seen by [facility] via Telemedicine on 01/27/2019, Diagnosis-Afib, CrCl-46.38ml/min; Dose is appropriate based on dosing criteria. Will send in refill to requested pharmacy.

## 2019-09-03 NOTE — Telephone Encounter (Signed)
Application faxed as requested today 09/03/2019

## 2019-09-18 NOTE — Telephone Encounter (Signed)
Letter received from [facility] and [place] stating that they denied the pt asst for her Xarelto. Reason for denial: Xarelto is covered by the pts ins plan.  The letter states that they notified the pt of this denial as well.

## 2020-01-04 ENCOUNTER — Other Ambulatory Visit: Payer: Self-pay | Admitting: Internal Medicine

## 2020-01-06 NOTE — Telephone Encounter (Signed)
Prescription refill request for Xarelto received.  Indication: afib Last office visit: 01/27/2019 Weight: 67.6 Age:86 Scr:1 CrCl:41.5

## 2020-07-27 ENCOUNTER — Other Ambulatory Visit: Payer: Self-pay | Admitting: Internal Medicine

## 2020-07-27 NOTE — Telephone Encounter (Addendum)
Xarelto 15mg  refill request received. Pt is 87 years old, weight-67.6kg, Crea-1.00 on 11/17/2019 via [facility] from [facility], last seen by Dr. [place] on 01/27/2019-pt needs an appointment, Diagnosis-Afib, CrCl-41.33ml/min; Dose is appropriate based on dosing criteria.   Message to [facility] to schedule an appt  3/31/@449pm -since no appointment has been set at this time, I sent a message to the Scheduling Dept to assist in getting an appt.

## 2020-07-30 NOTE — Telephone Encounter (Signed)
Xarelto 15mg  refill request received. Pt is 87 years old, weight-67.6kg, Crea-1.00 on 11/17/2019 via [facility] from [facility], last seen by [facility]. [facility] on 01/27/2019 and has an appt with 01/29/2019 on 4/6/222, Diagnosis-Afib, CrCl-41.80ml/min; Dose is appropriate based on dosing criteria. Will send in refill to requested pharmacy.

## 2020-08-02 NOTE — Progress Notes (Signed)
Cardiology Office Note Date:  08/04/2020  Patient ID:  Kayla Wells, Kayla Wells 19-Jul-1929, MRN [id number] PCP:  Adrian Prince, MD  Cardiologist:  Dr. Eden Emms Electrophysiologist: Dr. Johney Frame    Chief Complaint: over due annual visit  History of Present Illness: Kayla Wells is a 87 y.o. female with history of recurrent TIA (pt reports 3 total) >>> ILR implanted >> Afib, ILD, GERD, HTN.  She comes in today to be seen for Dr. Johney Frame, last seen by him via tele health visit Sept 2020.  She was feeling well, doing well with xarelto.  No changes were made.  TODAY She reports in the last year increased DOE. No rest SOB, no nocturnal or positional SOB No CP, palpitations or cardiac awareness. No dizzy spells, near syncope or syncope. No bleeding or signs of bleeding  She has not seen Dr. Delton Coombes in well over a year   Device information: MDT ILR, implanted 02/03/16, recurrent TIA  RRT April 2021   AFib Hx Diagnosed via loop Oct 2019  AAD Hx None to date   Past Medical History:  Diagnosis Date  . Arthritis   . Cervical stenosis (uterine cervix)   . Esophageal stricture   . GERD (gastroesophageal reflux disease)   . Hypothyroidism   . Osteopenia   . Other and unspecified hyperlipidemia   . Pulmonary fibrosis (HCC)   . Unspecified essential hypertension     Past Surgical History:  Procedure Laterality Date  . ABDOMINAL HYSTERECTOMY  1978  . EP IMPLANTABLE DEVICE N/A 02/03/2016   Procedure: Loop Recorder Insertion;  Surgeon: Hillis Range, MD;  Location: [facility] INVAS[facility] CV LAB;  Service: Cardiovascular;  Laterality: N/A;  . foot susrgery    . NASAL SINUS SURGERY    . REVISION TOTAL KNEE ARTHROPLASTY    . rotator cuff surgery Left 2014  . TOTAL HIP ARTHROPLASTY     right  . VESICOVAGINAL FISTULA CLOSURE W/ TAH  1978    Current Outpatient Medications  Medication Sig Dispense Refill  . acetaminophen (TYLENOL) 500 MG tablet Take 500-1,000 mg by mouth every 6 (six) hours as needed  for mild pain (depends on pain).    Marland Kitchen ALPRAZolam (XANAX) 0.25 MG tablet Take 0.25 mg by mouth daily as needed. For anxiety    . B Complex Vitamins (B-COMPLEX/B-12 PO) Take 1 tablet by mouth daily.    . fluticasone (FLONASE) 50 MCG/ACT nasal spray Place 2 sprays into the nose every evening.    . hydrochlorothiazide (MICROZIDE) 12.5 MG capsule Take 12.5 mg by mouth daily.    . irbesartan (AVAPRO) 150 MG tablet Take 150 mg by mouth daily.    Marland Kitchen levothyroxine (SYNTHROID) 150 MCG tablet Take 150 mcg by mouth daily before breakfast. Monday-Friday    . oxymetazoline (AFRIN) 0.05 % nasal spray Place 2 sprays into the nose every evening.    . polyethylene glycol (MIRALAX / GLYCOLAX) packet Take 17 g by mouth daily as needed for mild constipation.     . rosuvastatin (CRESTOR) 10 MG tablet Take 10 mg by mouth daily.    . traMADol (ULTRAM) 50 MG tablet Take 50 mg by mouth every 6 (six) hours as needed for moderate pain.     Marland Kitchen XARELTO 15 MG TABS tablet TAKE 1 TABLET BY MOUTH EVERY DAY WITH SUPPER 30 tablet 5   No current facility-administered medications for this visit.    Allergies:   Fish oil, Macrodantin [nitrofurantoin macrocrystal], and Other   Social History:  The patient  reports that she has never smoked. She has never used smokeless tobacco. She reports that she does not drink alcohol and does not use drugs.   Family History:  The patient's family history includes Heart disease in her father; Stroke in her father; Tuberculosis in her maternal grandfather.  ROS:  Please see the history of present illness.  All other systems are reviewed and otherwise negative.   PHYSICAL EXAM:  VS:  BP 100/64   Pulse 66   Ht 5\' 1"  (1.549 m)   Wt 142 lb (64.4 kg)   SpO2 95%   BMI 26.83 kg/m  BMI: Body mass index is 26.83 kg/m. Well nourished, well developed, in no acute distress, thin body habitus  HEENT: normocephalic, atraumatic  Neck: no JVD, carotid bruits or masses Cardiac:  RRR; no significant  murmurs, no rubs, or gallops Lungs:  CTA b/l, no wheezing, rhonchi or rales, no crackles Abd: soft, nontender, MS: no deformity, age appropriate atrophy Ext: trace edema  Skin: warm and dry, no rash Neuro:  No gross deficits appreciated Psych: euthymic mood, full affect   EKG:  Done tody and reviewed by myself SR 66bpm    01/27/16: TTE Study Conclusions - Left ventricle: The cavity size was normal. Wall thickness was   normal. Systolic function was normal. The estimated ejection   fraction was in the range of 55% to 60%. Wall motion was normal;   there were no regional wall motion abnormalities. Doppler   parameters are consistent with abnormal left ventricular   relaxation (grade 1 diastolic dysfunction). - Aortic valve: Trileaflet; mildly thickened, moderately calcified   leaflets. - Aorta: Ascending aortic diameter: 41 mm (S). - Ascending aorta: The ascending aorta was mildly dilated. - Mitral valve: Moderately calcified annulus. There was trivial   regurgitation. Impressions: - No cardiac source of emboli was indentified.    Recent Labs: No results found for requested labs within last 8760 hours.  No results found for requested labs within last 8760 hours.   CrCl cannot be calculated (Patient's most recent lab result is older than the maximum 21 days allowed.).   Wt Readings from Last 3 Encounters:  08/04/20 142 lb (64.4 kg)  01/27/19 149 lb (67.6 kg)  11/12/18 140 lb (63.5 kg)     Other studies reviewed: Additional studies/records reviewed today include: summarized above  ASSESSMENT AND PLAN:  1. TIA hx >> loop implant 2017 >> RRT April 2021 2. PAFib noted on loop     CHA2DS2Vasc is 5, on Xarelto, appropriately dosed     No symptoms of her AF  She reports regular labs with Dr. May 2021  3. HTN     Looks OK  4. DOE      More in the last year     She does not appear volume OL, known ILD and dsicussed revisiting with Dr. Evlyn Kanner.     She does not want any  changes, feels ike she is having age appropriate progression of her symptoms and feels "just fine"       She requests an annual visit and would like it to be virtual She sees dr. Delton Coombes she says routinely and has labs done there   Disposition: will plan a virtual visit in a year and her request, sooner if needed.  Current medicines are reviewed at length with the patient today.  The patient did not have any concerns regarding medicines  Signed, Evlyn Kanner, PA-C 08/04/2020 6:30 PM     [facility] [address] [place] [address] [phone] (office)  [phone] (fax)

## 2020-08-04 ENCOUNTER — Encounter: Payer: Self-pay | Admitting: Physician Assistant

## 2020-08-04 ENCOUNTER — Other Ambulatory Visit: Payer: Self-pay

## 2020-08-04 ENCOUNTER — Ambulatory Visit: Payer: Medicare Other | Admitting: Physician Assistant

## 2020-08-04 VITALS — BP 100/64 | HR 66 | Ht 61.0 in | Wt 142.0 lb

## 2020-08-04 DIAGNOSIS — I10 Essential (primary) hypertension: Secondary | ICD-10-CM | POA: Diagnosis not present

## 2020-08-04 DIAGNOSIS — I480 Paroxysmal atrial fibrillation: Secondary | ICD-10-CM

## 2020-08-04 DIAGNOSIS — R0602 Shortness of breath: Secondary | ICD-10-CM

## 2020-08-04 NOTE — Patient Instructions (Signed)
Medication Instructions:  Your physician recommends that you continue on your current medications as directed. Please refer to the Current Medication list given to you today.  *If you need a refill on your cardiac medications before your next appointment, please call your pharmacy*   Lab Work: NONE ORDERED  TODAY   If you have labs (blood work) drawn today and your tests are completely normal, you will receive your results only by: Marland Kitchen MyChart Message (if you have MyChart) OR . A paper copy in the mail If you have any lab test that is abnormal or we need to change your treatment, we will call you to review the results.   Testing/Procedures: NONE ORDERED  TODAY    Follow-Up: At [facility], you and your health needs are our priority.  As part of our continuing mission to provide you with exceptional heart care, we have created designated Provider Care Teams.  These Care Teams include your primary Cardiologist (physician) and Advanced Practice Providers (APPs -  Physician Assistants and Nurse Practitioners) who all work together to provide you with the care you need, when you need it.  We recommend signing up for the patient portal called "MyChart".  Sign up information is provided on this After Visit Summary.  MyChart is used to connect with patients for Virtual Visits (Telemedicine).  Patients are able to view lab/test results, encounter notes, upcoming appointments, etc.  Non-urgent messages can be sent to your provider as well.   To learn more about what you can do with MyChart, go to [email or web address].    Your next appointment:   1 year(s)  The format for your next appointment:   Virtual Visit   Provider:   Loman Brooklyn, MD   Other Instructions

## 2021-02-01 ENCOUNTER — Other Ambulatory Visit: Payer: Self-pay | Admitting: Internal Medicine

## 2021-02-01 DIAGNOSIS — I480 Paroxysmal atrial fibrillation: Secondary | ICD-10-CM

## 2021-02-01 NOTE — Telephone Encounter (Addendum)
Xarelto 15mg  refill request received. Pt is 87 years old, weight-64.4kg, Crea-1.00 on 11/17/2019 via [facility], last seen by 11/19/2019 on 08/04/2020, Diagnosis-Afib, CrCl-38.68ml/min; Dose is appropriate based on dosing criteria.   Will need to call PCP to check if any updated labs.   At 840am, called PCP Dr. 65m and had to leave a message for the CMA to call back or fax over CBC or BMET labs that have been done in the past year. Will await a response or callback.  PCP office faxed over labs and the pt only had a a CBC & Iron levels done on 01/14/2021. Will need to contact pt to have labs completed. Left a message for the pt to call back for details.   02/03/21 at 125pm, called pt and advised she needs labs completed to ensure Xarelto dosing and she stated she would check with her son cause he would bring her. Asked if I could call her back today and she stated not today. She stated she just had the Xarelto refilled so she is not out & explained that it will run out after that so we would want to obtain labs. She stated she would speak to her son tonight and advised her to reach out so we can get labs ordered and she stated okay.

## 2021-02-08 ENCOUNTER — Telehealth: Payer: Self-pay | Admitting: Internal Medicine

## 2021-02-08 NOTE — Telephone Encounter (Signed)
Can't find a phone call documented about lab work needed or a call place to the patient. Advised will will hold off for now and call back if something is needed.  Verbalized understanding.

## 2021-02-08 NOTE — Telephone Encounter (Signed)
Patient states she was returning a call about having lab work done. Please advise

## 2021-02-22 NOTE — Telephone Encounter (Signed)
Called pt to follow up to see if she had a date she would like to have her Xarelto follow up labs done. She stated that she would call back and speak to her son. Advised we need them to follow up on Xarelto doseage since she is requesting a Xarelto refill. She states she is not out of the Xarelto medication. Advised before refilling we need the labs and she verbalized understanding.  Advised since the Xarelto is not needed , will deny this pending refill and await for her to call back to have labs scheduled when her son is available to bring her.   Ordered labs (CBC & BMET) in the event she call back to schedule.

## 2021-03-09 ENCOUNTER — Other Ambulatory Visit: Payer: Self-pay | Admitting: Internal Medicine

## 2021-03-09 NOTE — Telephone Encounter (Addendum)
Xarelto 15mg  refill request received. Pt is 87 years old, weight-64.4kg, Crea-1.00 on 11/17/2019 via [facility] from [facility], last seen by [facility] on 08/04/2020, Diagnosis-Afib, CrCl-38.45ml/min.   Pt needs updated labs.

## 2021-03-14 NOTE — Telephone Encounter (Signed)
Called pt to check in on her xarelto supply and she stated she was ready for it. She recalled she needs labs and was able to get them scheduled over the phone. Also, she is aware that a limited supply of Xarelto was sent and will send in more after labs. She confirmed appt and states she has someone to bring her at that time.

## 2021-03-16 ENCOUNTER — Other Ambulatory Visit: Payer: Self-pay

## 2021-03-16 ENCOUNTER — Other Ambulatory Visit: Payer: Medicare Other | Admitting: *Deleted

## 2021-03-16 DIAGNOSIS — I480 Paroxysmal atrial fibrillation: Secondary | ICD-10-CM

## 2021-03-16 LAB — CBC
Hematocrit: 27.6 % — ABNORMAL LOW (ref 34.0–46.6)
Hemoglobin: 7.8 g/dL — ABNORMAL LOW (ref 11.1–15.9)
MCH: 19.6 pg — ABNORMAL LOW (ref 26.6–33.0)
MCHC: 28.3 g/dL — ABNORMAL LOW (ref 31.5–35.7)
MCV: 70 fL — ABNORMAL LOW (ref 79–97)
Platelets: 446 10*3/uL (ref 150–450)
RBC: 3.97 x10E6/uL (ref 3.77–5.28)
RDW: 18.5 % — ABNORMAL HIGH (ref 11.7–15.4)
WBC: 8.7 10*3/uL (ref 3.4–10.8)

## 2021-03-16 LAB — BASIC METABOLIC PANEL
BUN/Creatinine Ratio: 30 — ABNORMAL HIGH (ref 12–28)
BUN: 27 mg/dL (ref 8–27)
CO2: 25 mmol/L (ref 20–29)
Calcium: 9.5 mg/dL (ref 8.7–10.3)
Chloride: 102 mmol/L (ref 96–106)
Creatinine, Ser: 0.9 mg/dL (ref 0.57–1.00)
Glucose: 110 mg/dL — ABNORMAL HIGH (ref 70–99)
Potassium: 4.5 mmol/L (ref 3.5–5.2)
Sodium: 142 mmol/L (ref 134–144)
eGFR: 61 mL/min/{1.73_m2} (ref >59)

## 2021-03-16 NOTE — Telephone Encounter (Signed)
Prescription refill request for Xarelto received.  Indication: Afib  Last office visit:08/04/20 (Renee)  Weight: 64.4kg Age: 87 Scr: 1.00 (11/17/19)  CrCl:    Labs ordered and drawn. Pending results.

## 2021-03-17 MED ORDER — RIVAROXABAN 15 MG PO TABS: ORAL_TABLET | ORAL | 1 refills | Status: DC

## 2021-03-17 NOTE — Addendum Note (Signed)
Addended by: Malena Peer D on: 03/17/2021 12:30 PM   Modules accepted: Orders

## 2021-03-17 NOTE — Telephone Encounter (Signed)
HBG stable from prior- iron deficiency anemia being managed by PCP Continue Xarelto 15mg  daily

## 2021-09-30 ENCOUNTER — Other Ambulatory Visit: Payer: Self-pay | Admitting: Internal Medicine

## 2021-09-30 NOTE — Telephone Encounter (Signed)
Prescription refill request for Xarelto received.  Indication: PAF Last office visit: 08/04/20 Kayla Free PA-C Weight: 64.4kg Age: 88 Scr: 0.90 on 03/16/21 CrCl: 42.24  Based on above findings Xarelto 15mg  daily is the appropriate dose. Pt is PAST DUE for appt with Dr .  Message sent to schedulers to make appt.   Refill approved x 1 only.

## 2021-12-04 ENCOUNTER — Other Ambulatory Visit: Payer: Self-pay | Admitting: Cardiovascular Disease

## 2021-12-04 DIAGNOSIS — I480 Paroxysmal atrial fibrillation: Secondary | ICD-10-CM

## 2021-12-05 NOTE — Telephone Encounter (Signed)
Prescription refill request for Xarelto received.  Indication: Afib  Last office visit: 08/04/20 Keitha Butte) Weight: 64.4kg Age: 88 Scr: 0.90 (03/16/21)  CrCl: 42.23ml/min   Pt overdue for office visit. Called pt and made her aware. Mrs Huneycutt stated she has been feeling great and didn't see the need for an office visit. I made her aware that she must see a provider once a year for future refills. Pt verbalized understanding. I transferred pt to scheduling and sent refill to requested pharmacy.

## 2022-01-02 ENCOUNTER — Other Ambulatory Visit: Payer: Self-pay

## 2022-01-02 ENCOUNTER — Emergency Department (HOSPITAL_BASED_OUTPATIENT_CLINIC_OR_DEPARTMENT_OTHER)
Admission: EM | Admit: 2022-01-02 | Discharge: 2022-01-03 | Disposition: A | Discharge status: Home / Self Care | Payer: Medicare Other | Attending: Emergency Medicine | Admitting: Emergency Medicine

## 2022-01-02 ENCOUNTER — Encounter (HOSPITAL_BASED_OUTPATIENT_CLINIC_OR_DEPARTMENT_OTHER): Payer: Self-pay | Admitting: Obstetrics and Gynecology

## 2022-01-02 ENCOUNTER — Encounter (HOSPITAL_COMMUNITY): Payer: Self-pay

## 2022-01-02 ENCOUNTER — Emergency Department (HOSPITAL_BASED_OUTPATIENT_CLINIC_OR_DEPARTMENT_OTHER): Payer: Medicare Other

## 2022-01-02 DIAGNOSIS — Z7901 Long term (current) use of anticoagulants: Secondary | ICD-10-CM | POA: Diagnosis not present

## 2022-01-02 DIAGNOSIS — R531 Weakness: Secondary | ICD-10-CM | POA: Diagnosis present

## 2022-01-02 DIAGNOSIS — I10 Essential (primary) hypertension: Secondary | ICD-10-CM | POA: Insufficient documentation

## 2022-01-02 DIAGNOSIS — G459 Transient cerebral ischemic attack, unspecified: Secondary | ICD-10-CM | POA: Diagnosis not present

## 2022-01-02 DIAGNOSIS — Z79899 Other long term (current) drug therapy: Secondary | ICD-10-CM | POA: Insufficient documentation

## 2022-01-02 LAB — CBC WITH DIFFERENTIAL/PLATELET
Abs Immature Granulocytes: 0.02 10*3/uL (ref 0.00–0.07)
Basophils Absolute: 0 10*3/uL (ref 0.0–0.1)
Basophils Relative: 0 %
Eosinophils Absolute: 0.2 10*3/uL (ref 0.0–0.5)
Eosinophils Relative: 2 %
HCT: 41.8 % (ref 36.0–46.0)
Hemoglobin: 12.9 g/dL (ref 12.0–15.0)
Immature Granulocytes: 0 %
Lymphocytes Relative: 31 %
Lymphs Abs: 2.6 10*3/uL (ref 0.7–4.0)
MCH: 24.1 pg — ABNORMAL LOW (ref 26.0–34.0)
MCHC: 30.9 g/dL (ref 30.0–36.0)
MCV: 78.1 fL — ABNORMAL LOW (ref 80.0–100.0)
Monocytes Absolute: 0.7 10*3/uL (ref 0.1–1.0)
Monocytes Relative: 8 %
Neutro Abs: 4.8 10*3/uL (ref 1.7–7.7)
Neutrophils Relative %: 59 %
Platelets: 332 10*3/uL (ref 150–400)
RBC: 5.35 MIL/uL — ABNORMAL HIGH (ref 3.87–5.11)
RDW: 20.1 % — ABNORMAL HIGH (ref 11.5–15.5)
WBC: 8.3 10*3/uL (ref 4.0–10.5)
nRBC: 0 % (ref 0.0–0.2)

## 2022-01-02 LAB — BASIC METABOLIC PANEL
Anion gap: 11 (ref 5–15)
BUN: 28 mg/dL — ABNORMAL HIGH (ref 8–23)
CO2: 26 mmol/L (ref 22–32)
Calcium: 9.6 mg/dL (ref 8.9–10.3)
Chloride: 98 mmol/L (ref 98–111)
Creatinine, Ser: 0.7 mg/dL (ref 0.44–1.00)
GFR, Estimated: >60 mL/min (ref >60)
Glucose, Bld: 108 mg/dL — ABNORMAL HIGH (ref 70–99)
Potassium: 3.7 mmol/L (ref 3.5–5.1)
Sodium: 135 mmol/L (ref 135–145)

## 2022-01-02 NOTE — ED Provider Notes (Signed)
[facility] EMERGENCY DEPT Provider Note   CSN: [id number] Arrival date & time: 01/02/22  1757     History  Chief Complaint  Patient presents with   Transient Ischemic Attack    Kayla Wells is a 88 y.o. female.  Patient here after strokelike symptoms.  Patient with history of mini strokes on Xarelto.  History of high blood pressure.  About 2 hours ago she had an episode where she had difficulty getting words out.  She understands the words that were being told to her but she could not get the words out.  Family noticed right-sided facial droop and right arm weakness.  This resolved within about 30 minutes.  This has not occurred to her in about 4 years when family thought was her last mini stroke.  She had a loop recorder in the past.  But no definitive atrial fibrillation was ever found per family.  Right now she denies any headache or weakness or numbness or chills.  No chest pain or shortness of breath.  She has been in her normal state of health otherwise.  The history is provided by the patient and a caregiver.       Home Medications Prior to Admission medications   Medication Sig Start Date End Date Taking? Authorizing Provider  acetaminophen (TYLENOL) 500 MG tablet Take 500-1,000 mg by mouth every 6 (six) hours as needed for mild pain (depends on pain).    [provider]  ALPRAZolam Prudy Feeler) 0.25 MG tablet Take 0.25 mg by mouth daily as needed. For anxiety    [provider]  B Complex Vitamins (B-COMPLEX/B-12 PO) Take 1 tablet by mouth daily.    [provider]  fluticasone (FLONASE) 50 MCG/ACT nasal spray Place 2 sprays into the nose every evening.    [provider]  hydrochlorothiazide (MICROZIDE) 12.5 MG capsule Take 12.5 mg by mouth daily.    [provider]  irbesartan (AVAPRO) 150 MG tablet Take 150 mg by mouth daily.    [provider]  levothyroxine (SYNTHROID) 150 MCG tablet Take 150 mcg by mouth  daily before breakfast. Monday-Friday    [provider]  oxymetazoline (AFRIN) 0.05 % nasal spray Place 2 sprays into the nose every evening.    [provider]  polyethylene glycol (MIRALAX / GLYCOLAX) packet Take 17 g by mouth daily as needed for mild constipation.     [provider]  Rivaroxaban (XARELTO) 15 MG TABS tablet TAKE 1 TABLET BY MOUTH EVERY DAY WITH SUPPER 12/05/21   Allred, Fayrene Fearing, MD  rosuvastatin (CRESTOR) 10 MG tablet Take 10 mg by mouth daily.    [provider]  traMADol (ULTRAM) 50 MG tablet Take 50 mg by mouth every 6 (six) hours as needed for moderate pain.     [provider]      Allergies    Fish oil, Macrodantin [nitrofurantoin macrocrystal], and Other    Review of Systems   Review of Systems  Physical Exam Updated Vital Signs BP 135/81   Pulse 79   Temp 98.2 F (36.8 C) (Oral)   Resp (!) 24   Ht 5\' 1"  (1.549 m)   Wt 64.3 kg   SpO2 94%   BMI 26.78 kg/m  Physical Exam Vitals and nursing note reviewed.  Constitutional:      General: She is not in acute distress.    Appearance: She is well-developed. She is not ill-appearing.  HENT:     Head: Normocephalic and atraumatic.  Nose: Nose normal.     Mouth/Throat:     Mouth: Mucous membranes are moist.  Eyes:     Extraocular Movements: Extraocular movements intact.     Conjunctiva/sclera: Conjunctivae normal.     Pupils: Pupils are equal, round, and reactive to light.  Cardiovascular:     Rate and Rhythm: Normal rate and regular rhythm.     Pulses: Normal pulses.     Heart sounds: Normal heart sounds. No murmur heard. Pulmonary:     Effort: Pulmonary effort is normal. No respiratory distress.     Breath sounds: Normal breath sounds.  Abdominal:     Palpations: Abdomen is soft.     Tenderness: There is no abdominal tenderness.  Musculoskeletal:        General: No swelling.     Cervical back: Normal range of motion and neck supple.  Skin:    General:  Skin is warm and dry.     Capillary Refill: Capillary refill takes less than 2 seconds.  Neurological:     General: No focal deficit present.     Mental Status: She is alert and oriented to person, place, and time.     Cranial Nerves: No cranial nerve deficit.     Sensory: No sensory deficit.     Motor: No weakness.     Coordination: Coordination normal.     Comments: 5+ out of 5 strength throughout, normal sensation, no drift, no droop, normal speech, normal finger-nose-finger, normal visual fields  Psychiatric:        Mood and Affect: Mood normal.     ED Results / Procedures / Treatments   Labs (all labs ordered are listed, but only abnormal results are displayed) Labs Reviewed  CBC WITH DIFFERENTIAL/PLATELET - Abnormal; Notable for the following components:      Result Value   RBC 5.35 (*)    MCV 78.1 (*)    MCH 24.1 (*)    RDW 20.1 (*)    All other components within normal limits  BASIC METABOLIC PANEL - Abnormal; Notable for the following components:   Glucose, Bld 108 (*)    BUN 28 (*)    All other components within normal limits    EKG EKG Interpretation  Date/Time:  Monday January 02 2022 18:07:38 EDT Ventricular Rate:  72 PR Interval:  235 QRS Duration: 98 QT Interval:  397 QTC Calculation: 435 R Axis:   134 Text Interpretation: Sinus rhythm Prolonged PR interval Confirmed by Virgina Norfolk ([id number]) on 01/02/2022 6:16:44 PM  Radiology CT Head Wo Contrast  Result Date: 01/02/2022 CLINICAL DATA:  Transient aphasia this afternoon, TIA suspected EXAM: CT HEAD WITHOUT CONTRAST TECHNIQUE: Contiguous axial images were obtained from the base of the skull through the vertex without intravenous contrast. RADIATION DOSE REDUCTION: This exam was performed according to the departmental dose-optimization program which includes automated exposure control, adjustment of the mA and/or kV according to patient size and/or use of iterative reconstruction technique. COMPARISON:   01/21/2016 FINDINGS: Brain: No evidence of acute infarction, acute hemorrhage, hydrocephalus, new extra-axial collection or mass lesion/mass effect. Probable very small, bilateral chronic hygromas, unchanged compared to remote prior examination dated 01/21/2016. Periventricular and deep white matter hypodensity. Vascular: No hyperdense vessel or unexpected calcification. Skull: Normal. Negative for fracture or focal lesion. Sinuses/Orbits: No acute finding. Other: None. IMPRESSION: 1. No acute intracranial pathology. Small-vessel white matter disease. 2. Probable very small, bilateral chronic hygromas, unchanged compared to remote prior examination dated 01/21/2016. Electronically Signed   By:  Jearld Lesch M.D.   On: 01/02/2022 18:51    Procedures Procedures    Medications Ordered in ED Medications - No data to display  ED Course/ Medical Decision Making/ A&P                           Medical Decision Making Amount and/or Complexity of Data Reviewed Labs: ordered. Radiology: ordered.  Risk Decision regarding hospitalization.   Kayla Wells is here after strokelike symptoms.  Blood pressure mildly elevated but otherwise normal vitals.  Aphasia, right-sided face droop and right arm weakness about 2 hours ago but all seem to resolve within 30 minutes at least.  May be sooner.  History of TIA, on Xarelto.  History of hypertension.  History of high cholesterol.  Wore loop recorder for a while but was never found to have A-fib.  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  She has not had an episode like this in 4 years per family.  She is neurologically intact now.  We will get a CBC, BMP, CT scan of head and talk with neurology for admission for possible TIA work-up.  I have no concern for other acute process.  She not have any chest pain or shortness of breath.  Could be stress related event but does have multiple stroke risk factors.  No infectious symptoms.  No syncope symptoms.  Per  my review and interpretation of labs is no significant anemia, electrolyte abnormality, kidney injury.  CT scan per radiology report with no acute findings.  Talked with Dr. Otelia Limes with neurology who recommends admission for TIA work-up.  To be admitted to medicine for further care.  This chart was dictated using voice recognition software.  Despite best efforts to proofread,  errors can occur which can change the documentation meaning.         Final Clinical Impression(s) / ED Diagnoses Final diagnoses:  TIA (transient ischemic attack)    Rx / DC Orders ED Discharge Orders     None         Virgina Norfolk, DO 01/02/22 2310

## 2022-01-02 NOTE — ED Triage Notes (Signed)
Patient reports to the ER for TIA symptoms. She reports she lost her ability to get her words out at 1630 this afternoon and reports it did not last long. Patient is speaking appropriately, no obvious facial droop, no weakness or numbness reported to one side.

## 2022-01-02 NOTE — Plan of Care (Signed)
[facility] will assume care on arrival to accepting facility. Until arrival, care as per EDP. However, [facility] available 24/7 for questions and assistance.  Nursing staff, please page [facility] Admits and Consults ([phone]) as soon as the patient arrives to the hospital.   

## 2022-01-03 ENCOUNTER — Encounter (HOSPITAL_BASED_OUTPATIENT_CLINIC_OR_DEPARTMENT_OTHER): Payer: Self-pay | Admitting: Internal Medicine

## 2022-01-03 ENCOUNTER — Emergency Department (HOSPITAL_BASED_OUTPATIENT_CLINIC_OR_DEPARTMENT_OTHER): Payer: Medicare Other

## 2022-01-03 MED ORDER — ACETAMINOPHEN 325 MG PO TABS
650.0000 mg | ORAL_TABLET | Freq: Once | ORAL | Status: AC
  Administered 2022-01-03: 650 mg via ORAL
  Filled 2022-01-03: qty 2

## 2022-01-03 NOTE — ED Provider Notes (Signed)
Spoke with Dr. Iver Nestle this morning about this patient who is currently awaiting admission for TIA work-up.  She reports because the patient is on Xarelto that she is already optimized for stroke risk.  She recommended initially that patient get an MRI to ensure no evidence of stroke.  Patient does have a loop recorder but after speaking with radiology techs this is MRI compatible.  Patient was excepted to [facility] to get an MRI however after talking with the patient and her son she reports that she cannot stand in close spaces and does not feel that she can undergo a closed MRI even with medication.  She does not desire to do that.  Discussed of back with neurology who recommended that she get a repeat CT of her head as it is now been 16 hours since her symptoms.  CT of the head is still negative for any acute process.  At this time patient would like to go home and follow-up with her PCP.  She will continue on the Xarelto.  She does know that there is a small risk that there could be a bleed if there was a small stroke that we missed on CAT scan but she is willing to take that risk.  She knows to return if she has any recurrent symptoms, new confusion or other concerns.  Patient's son is present throughout these conversations.  They are comfortable with this plan.  Encourage patient to call her PCP today for a visit later this week.   Gwyneth Sprout, MD 01/03/22 [phone]

## 2022-01-03 NOTE — ED Notes (Signed)
Patient assisted to bathroom with wheelchair without difficulty. Patient reports she is eagerly awaiting information about MRI vs. No MRI and going home. Patient has a loop recorder from 2017. Patient is unsure if MRI compatible.

## 2022-01-03 NOTE — Plan of Care (Signed)
Discussed patient with Dr. Anitra Lauth.  From a stroke perspective patient is medically optimized given she is on Eliquis.  Patient is refusing MRI brain and due to severe claustrophobia and lack of open MRI at our facilities.  She had a repeat head CT which I reviewed and agree with radiology that there is no clear acute intracranial process large enough that would be a contraindication for continuing anticoagulation.  Given she has no residual symptoms and is back to baseline per ED provider, this also favors no significant stroke.    In this setting I am comfortable with her continuing her anticoagulation at this time and having close outpatient follow-up with her neurologist. However she should be counseled about the risk of intracerebral hemorrhage and the lower sensitivity of CT versus MRI.   These were brief curbside recommendations based on brief review of the chart and discussion with ED provider  Brooke Dare MD-PhD [facility] [phone]  Available 7 AM to 7 PM, outside these hours please contact Neurologist on call listed on AMION

## 2022-01-03 NOTE — Discharge Instructions (Signed)
If your symptoms return or you suddenly develop confusion, difficulty walking or any other concerns to go directly to [facility]

## 2022-01-03 NOTE — ED Notes (Signed)
Discharge instructions and follow up care reviewed and explained, pt verbalized understanding and had no further questions on d/c. Pt caox4 and assisted to vehicle by wheelchair on d/c.

## 2022-02-01 ENCOUNTER — Encounter: Payer: Self-pay | Admitting: Cardiology

## 2022-02-01 ENCOUNTER — Ambulatory Visit: Payer: Medicare Other | Attending: Cardiology | Admitting: Cardiology

## 2022-02-01 VITALS — BP 156/79 | Ht 61.0 in | Wt 140.0 lb

## 2022-02-01 DIAGNOSIS — D6869 Other thrombophilia: Secondary | ICD-10-CM | POA: Diagnosis not present

## 2022-02-01 DIAGNOSIS — I480 Paroxysmal atrial fibrillation: Secondary | ICD-10-CM

## 2022-02-01 NOTE — Patient Instructions (Signed)
Medication Instructions:  Your physician recommends that you continue on your current medications as directed. Please refer to the Current Medication list given to you today.  *If you need a refill on your cardiac medications before your next appointment, please call your pharmacy*  Follow-Up: At [facility], you and your health needs are our priority.  As part of our continuing mission to provide you with exceptional heart care, we have created designated Provider Care Teams.  These Care Teams include your primary Cardiologist (physician) and Advanced Practice Providers (APPs -  Physician Assistants and Nurse Practitioners) who all work together to provide you with the care you need, when you need it.  Your next appointment:   1 year(s)  The format for your next appointment:   Virtual Visit  Provider:   You will see one of the following Advanced Practice Providers on your designated Care Team:   Tommye Standard, [place]

## 2022-02-01 NOTE — Progress Notes (Signed)
Electrophysiology TeleHealth Note   Due to national recommendations of social distancing due to COVID 19, an audio/video telehealth visit is felt to be most appropriate for this patient at this time.  See Epic message for the patient's consent to telehealth for [facility].   Date:  02/01/2022   ID:  Kayla Wells, DOB [birth year], MRN [id number]  Location: patient's home  Provider location: [address], [place]  Evaluation Performed: Follow-up visit  PCP:  Adrian Prince, MD  Cardiologist:  Eden Emms Electrophysiologist:  Dr Elberta Fortis  Chief Complaint:  AF  History of Present Illness:    Kayla Wells is a 88 y.o. female who presents via audio/video conferencing for a telehealth visit today.  Since last being seen in our clinic, the patient reports doing very well.  Today, she denies symptoms of palpitations, chest pain, shortness of breath,  lower extremity edema, dizziness, presyncope, or syncope.  The patient is otherwise without complaint today.  The patient denies symptoms of fevers, chills, cough, or new SOB worrisome for COVID 19.  He has a history significant for TIA, atrial fibrillation, GERD, hypertension.  She has had 3 TIAs and her atrial fibrillation was diagnosed on ILR.  She is currently on Xarelto.  Today, denies symptoms of palpitations, chest pain, shortness of breath, orthopnea, PND, lower extremity edema, claudication, dizziness, presyncope, syncope, bleeding, or neurologic sequela. The patient is tolerating medications without difficulties.  Her main symptom is weakness, fatigue, shortness of breath.  She does not do much daily activity.  She is generally in the house and goes from the bed to the chair.  She does not have chest pain, but does get mildly short of breath.  Despite that, she is overall quite happy with how she has been doing.  Past Medical History:  Diagnosis Date   Arthritis    Cervical stenosis (uterine cervix)    Esophageal  stricture    GERD (gastroesophageal reflux disease)    Hypothyroidism    Osteopenia    Other and unspecified hyperlipidemia    Pulmonary fibrosis (HCC)    Unspecified essential hypertension     Past Surgical History:  Procedure Laterality Date   ABDOMINAL HYSTERECTOMY  1978   EP IMPLANTABLE DEVICE N/A 02/03/2016   Procedure: Loop Recorder Insertion;  Surgeon: Hillis Range, MD;  Location: [facility] INVASIVE CV LAB;  Service: Cardiovascular;  Laterality: N/A;   foot susrgery     NASAL SINUS SURGERY     REVISION TOTAL KNEE ARTHROPLASTY     rotator cuff surgery Left 2014   TOTAL HIP ARTHROPLASTY     right   VESICOVAGINAL FISTULA CLOSURE W/ TAH  1978    Current Outpatient Medications  Medication Sig Dispense Refill   acetaminophen (TYLENOL) 500 MG tablet Take 500-1,000 mg by mouth every 6 (six) hours as needed for mild pain (depends on pain).     ALPRAZolam (XANAX) 0.25 MG tablet Take 0.25 mg by mouth daily as needed. For anxiety     B Complex Vitamins (B-COMPLEX/B-12 PO) Take 1 tablet by mouth daily.     fluticasone (FLONASE) 50 MCG/ACT nasal spray Place 2 sprays into the nose every evening.     hydrochlorothiazide (MICROZIDE) 12.5 MG capsule Take 12.5 mg by mouth daily.     irbesartan (AVAPRO) 150 MG tablet Take 150 mg by mouth daily.     iron polysaccharides (NIFEREX) 150 MG capsule Take 150 mg by mouth daily.     levothyroxine (SYNTHROID) 150  MCG tablet Take 150 mcg by mouth daily before breakfast. Monday-Friday     oxymetazoline (AFRIN) 0.05 % nasal spray Place 2 sprays into the nose every evening.     Rivaroxaban (XARELTO) 15 MG TABS tablet TAKE 1 TABLET BY MOUTH EVERY DAY WITH SUPPER 30 tablet 2   rosuvastatin (CRESTOR) 10 MG tablet Take 10 mg by mouth daily.     Vitamin D, Ergocalciferol, (DRISDOL) 1.25 MG (50000 UNIT) CAPS capsule Take 50,000 Units by mouth 2 (two) times a week.     polyethylene glycol (MIRALAX / GLYCOLAX) packet Take 17 g by mouth daily as needed for mild  constipation.  (Patient not taking: Reported on 02/01/2022)     traMADol (ULTRAM) 50 MG tablet Take 50 mg by mouth every 6 (six) hours as needed for moderate pain.  (Patient not taking: Reported on 02/01/2022)     No current facility-administered medications for this visit.    Allergies:   Fish oil, Macrodantin [nitrofurantoin macrocrystal], and Other   Social History:  The patient  reports that she has never smoked. She has been exposed to tobacco smoke. She has never used smokeless tobacco. She reports that she does not drink alcohol and does not use drugs.   Family History:  The patient's  family history includes Heart disease in her father; Stroke in her father; Tuberculosis in her maternal grandfather.   ROS:  Please see the history of present illness.   All other systems are personally reviewed and negative.    Exam:    Vital Signs:  BP (!) 156/79   Ht 5\' 1"  (1.549 m)   Wt 140 lb (63.5 kg)   BMI 26.45 kg/m   Well appearing, alert and conversant, regular work of breathing,  good skin color Eyes- anicteric, neuro- grossly intact, skin- no apparent rash or lesions or cyanosis, mouth- oral mucosa is pink  Labs/Other Tests and Data Reviewed:    Recent Labs: 01/02/2022: BUN 28; Creatinine, Ser 0.70; Hemoglobin 12.9; Platelets 332; Potassium 3.7; Sodium 135   Wt Readings from Last 3 Encounters:  02/01/22 140 lb (63.5 kg)  01/02/22 141 lb 12.1 oz (64.3 kg)  08/04/20 142 lb (64.4 kg)     Other studies personally reviewed: Additional studies/ records that were reviewed today include: EKG 01/04/2022 personally reviewed Review of the above records today demonstrates: Sinus rhythm   ASSESSMENT & PLAN:    1.  Paroxysmal atrial fibrillation: CHA2DS2-VASc of 5.  Currently on Xarelto, appropriately dosed.  Was found on ILR implanted post stroke.  No symptoms of atrial fibrillation.  We Jamirah Zelaya continue with current management.  2.  Hypertension: Blood pressure is elevated.  Unclear as to if  it is usually well controlled.  Allow for further therapy per primary physician.  3.  Secondary hypercoagulable state: Currently on Xarelto for atrial fibrillation as above.  COVID 19 screen The patient denies symptoms of COVID 19 at this time.  The importance of social distancing was discussed today.  Follow-up: 1 year  Current medicines are reviewed at length with the patient today.   The patient does not have concerns regarding her medicines.  The following changes were made today:  none  Labs/ tests ordered today include:  No orders of the defined types were placed in this encounter.    Patient Risk:  after full review of this patients clinical status, I feel that they are at moderate risk at this time.     Signed, Fatimah Sundquist Meredith Leeds, MD  02/01/2022  4:34 PM     [facility] [address] [place] [address] [phone] (office) [phone] (fax)

## 2023-06-30 ENCOUNTER — Emergency Department (HOSPITAL_BASED_OUTPATIENT_CLINIC_OR_DEPARTMENT_OTHER): Admitting: Radiology

## 2023-06-30 ENCOUNTER — Other Ambulatory Visit: Payer: Self-pay

## 2023-06-30 ENCOUNTER — Emergency Department (HOSPITAL_BASED_OUTPATIENT_CLINIC_OR_DEPARTMENT_OTHER)
Admission: EM | Admit: 2023-06-30 | Discharge: 2023-06-30 | Disposition: A | Discharge status: Other Acute Inpatient Hospital | Attending: Emergency Medicine | Admitting: Emergency Medicine

## 2023-06-30 ENCOUNTER — Emergency Department (HOSPITAL_BASED_OUTPATIENT_CLINIC_OR_DEPARTMENT_OTHER)

## 2023-06-30 ENCOUNTER — Encounter (HOSPITAL_BASED_OUTPATIENT_CLINIC_OR_DEPARTMENT_OTHER): Payer: Self-pay | Admitting: Emergency Medicine

## 2023-06-30 DIAGNOSIS — I10 Essential (primary) hypertension: Secondary | ICD-10-CM | POA: Diagnosis not present

## 2023-06-30 DIAGNOSIS — K047 Periapical abscess without sinus: Secondary | ICD-10-CM | POA: Insufficient documentation

## 2023-06-30 DIAGNOSIS — I4891 Unspecified atrial fibrillation: Secondary | ICD-10-CM | POA: Insufficient documentation

## 2023-06-30 DIAGNOSIS — R0902 Hypoxemia: Secondary | ICD-10-CM | POA: Insufficient documentation

## 2023-06-30 DIAGNOSIS — Z7901 Long term (current) use of anticoagulants: Secondary | ICD-10-CM | POA: Insufficient documentation

## 2023-06-30 DIAGNOSIS — K0889 Other specified disorders of teeth and supporting structures: Secondary | ICD-10-CM | POA: Diagnosis present

## 2023-06-30 DIAGNOSIS — Z79899 Other long term (current) drug therapy: Secondary | ICD-10-CM | POA: Insufficient documentation

## 2023-06-30 LAB — CBC WITH DIFFERENTIAL/PLATELET
Abs Immature Granulocytes: 0.05 10*3/uL (ref 0.00–0.07)
Basophils Absolute: 0 10*3/uL (ref 0.0–0.1)
Basophils Relative: 0 %
Eosinophils Absolute: 0 10*3/uL (ref 0.0–0.5)
Eosinophils Relative: 0 %
HCT: 42 % (ref 36.0–46.0)
Hemoglobin: 13.8 g/dL (ref 12.0–15.0)
Immature Granulocytes: 0 %
Lymphocytes Relative: 9 %
Lymphs Abs: 1.3 10*3/uL (ref 0.7–4.0)
MCH: 27.8 pg (ref 26.0–34.0)
MCHC: 32.9 g/dL (ref 30.0–36.0)
MCV: 84.7 fL (ref 80.0–100.0)
Monocytes Absolute: 1.4 10*3/uL — ABNORMAL HIGH (ref 0.1–1.0)
Monocytes Relative: 10 %
Neutro Abs: 11.9 10*3/uL — ABNORMAL HIGH (ref 1.7–7.7)
Neutrophils Relative %: 81 %
Platelets: 388 10*3/uL (ref 150–400)
RBC: 4.96 MIL/uL (ref 3.87–5.11)
RDW: 14.9 % (ref 11.5–15.5)
WBC: 14.3 10*3/uL — ABNORMAL HIGH (ref 4.0–10.5)
nRBC: 0 % (ref 0.0–0.2)

## 2023-06-30 LAB — BASIC METABOLIC PANEL
Anion gap: 10 (ref 5–15)
BUN: 19 mg/dL (ref 8–23)
CO2: 29 mmol/L (ref 22–32)
Calcium: 9.2 mg/dL (ref 8.9–10.3)
Chloride: 95 mmol/L — ABNORMAL LOW (ref 98–111)
Creatinine, Ser: 0.61 mg/dL (ref 0.44–1.00)
GFR, Estimated: >60 mL/min (ref >60)
Glucose, Bld: 117 mg/dL — ABNORMAL HIGH (ref 70–99)
Potassium: 3.8 mmol/L (ref 3.5–5.1)
Sodium: 134 mmol/L — ABNORMAL LOW (ref 135–145)

## 2023-06-30 LAB — LACTIC ACID, PLASMA: Lactic Acid, Venous: 1 mmol/L (ref 0.5–1.9)

## 2023-06-30 MED ORDER — IOHEXOL 300 MG/ML  SOLN
75.0000 mL | Freq: Once | INTRAMUSCULAR | Status: AC | PRN
  Administered 2023-06-30: 80 mL via INTRAVENOUS

## 2023-06-30 MED ORDER — SODIUM CHLORIDE 0.9 % IV SOLN
3.0000 g | Freq: Once | INTRAVENOUS | Status: AC
  Administered 2023-06-30: 3 g via INTRAVENOUS

## 2023-06-30 NOTE — ED Triage Notes (Signed)
 Pt bib son, c/o RT lower facial swelling starting 2 days pta. Dentist prescribed abx yesterday, swelling increased. Decreased po intake today, denies fever

## 2023-06-30 NOTE — ED Provider Notes (Signed)
  EMERGENCY DEPARTMENT AT [facility] Provider Note   CSN: [id number] Arrival date & time: 06/30/23  1451     History  Chief Complaint  Patient presents with   Facial Swelling    Kayla Wells is a 89 y.o. female.  89 year old female with a history of atrial fibrillation on Xarelto, interstitial lung disease, hypertension, and multiple dental fillings who presents emergency department with dental pain and facial swelling.  On Thursday patient started having pain of her upper and lower molars on the right side.  Friday started experiencing some swelling.  Swelling got worse so decided to come into the emergency department.  No difficulty breathing.  Still tolerating secretions.  May have some voice changes.  Last took her Xarelto yesterday in the morning.  Denies any fevers at all.  Sounds like she may have been previously offered oxygen for interstitial lung disease but is not currently wear any.       Home Medications Prior to Admission medications   Medication Sig Start Date End Date Taking? Authorizing Provider  acetaminophen (TYLENOL) 500 MG tablet Take 500-1,000 mg by mouth every 6 (six) hours as needed for mild pain (depends on pain).    [provider]  ALPRAZolam Prudy Feeler) 0.25 MG tablet Take 0.25 mg by mouth daily as needed. For anxiety    [provider]  B Complex Vitamins (B-COMPLEX/B-12 PO) Take 1 tablet by mouth daily.    [provider]  fluticasone (FLONASE) 50 MCG/ACT nasal spray Place 2 sprays into the nose every evening.    [provider]  hydrochlorothiazide (MICROZIDE) 12.5 MG capsule Take 12.5 mg by mouth daily.    [provider]  irbesartan (AVAPRO) 150 MG tablet Take 150 mg by mouth daily.    [provider]  iron polysaccharides (NIFEREX) 150 MG capsule Take 150 mg by mouth daily. 12/04/21   [provider]  levothyroxine (SYNTHROID) 150 MCG tablet Take 150 mcg by mouth daily  before breakfast. Monday-Friday    [provider]  oxymetazoline (AFRIN) 0.05 % nasal spray Place 2 sprays into the nose every evening.    [provider]  polyethylene glycol (MIRALAX / GLYCOLAX) packet Take 17 g by mouth daily as needed for mild constipation.  Patient not taking: Reported on 02/01/2022    [provider]  Rivaroxaban (XARELTO) 15 MG TABS tablet TAKE 1 TABLET BY MOUTH EVERY DAY WITH SUPPER 12/05/21   Allred, Fayrene Fearing, MD  rosuvastatin (CRESTOR) 10 MG tablet Take 10 mg by mouth daily.    [provider]  traMADol (ULTRAM) 50 MG tablet Take 50 mg by mouth every 6 (six) hours as needed for moderate pain.  Patient not taking: Reported on 02/01/2022    [provider]  Vitamin D, Ergocalciferol, (DRISDOL) 1.25 MG (50000 UNIT) CAPS capsule Take 50,000 Units by mouth 2 (two) times a week. 11/29/21   [provider]      Allergies    Fish oil, Macrodantin [nitrofurantoin macrocrystal], and Other    Review of Systems   Review of Systems  Physical Exam Updated Vital Signs BP (!) 147/83   Pulse 74   Temp 98.4 F (36.9 C)   Resp 16   SpO2 96%  Physical Exam Vitals and nursing note reviewed.  Constitutional:      General: She is not in acute distress.    Appearance: She is well-developed.  HENT:     Head: Atraumatic.     Comments: Significant  swelling to right side of her face including right mandible.    Right Ear: External ear normal.     Left Ear: External ear normal.     Nose: Nose normal.     Mouth/Throat:     Comments: Uvula: without deviation or edema. Uvula midline. Soft palate: without swelling Sublingual: normal appearance/no brawny edema or tongue elevation Teeth and gums: Large amount of swelling on the right side of the face including the mandible.  Tenderness to palpation of teeth 1 through 3.  Does have some drainage.  Fluctuance palpated in this area as well. Tonsils: no erythema or exudates  Eyes:      Extraocular Movements: Extraocular movements intact.     Conjunctiva/sclera: Conjunctivae normal.     Pupils: Pupils are equal, round, and reactive to light.  Cardiovascular:     Rate and Rhythm: Normal rate and regular rhythm.     Heart sounds: No murmur heard. Pulmonary:     Effort: Pulmonary effort is normal. No respiratory distress.     Breath sounds: No stridor. Rales (Right-sided) present.     Comments: On 2 L nasal cannula Musculoskeletal:     Cervical back: Normal range of motion and neck supple.  Skin:    General: Skin is warm and dry.  Neurological:     Mental Status: She is alert and oriented to person, place, and time. Mental status is at baseline.  Psychiatric:        Mood and Affect: Mood normal.     ED Results / Procedures / Treatments   Labs (all labs ordered are listed, but only abnormal results are displayed) Labs Reviewed  CBC WITH DIFFERENTIAL/PLATELET - Abnormal; Notable for the following components:      Result Value   WBC 14.3 (*)    Neutro Abs 11.9 (*)    Monocytes Absolute 1.4 (*)    All other components within normal limits  BASIC METABOLIC PANEL - Abnormal; Notable for the following components:   Sodium 134 (*)    Chloride 95 (*)    Glucose, Bld 117 (*)    All other components within normal limits  CULTURE, BLOOD (ROUTINE X 2)  CULTURE, BLOOD (ROUTINE X 2)  LACTIC ACID, PLASMA    EKG None  Radiology CT Maxillofacial W Contrast Result Date: 06/30/2023 CLINICAL DATA:  Right-sided dental infection. EXAM: CT MAXILLOFACIAL WITH CONTRAST TECHNIQUE: Multidetector CT imaging of the maxillofacial structures was performed with intravenous contrast. Multiplanar CT image reconstructions were also generated. RADIATION DOSE REDUCTION: This exam was performed according to the departmental dose-optimization program which includes automated exposure control, adjustment of the mA and/or kV according to patient size and/or use of iterative reconstruction  technique. CONTRAST:  80mL OMNIPAQUE IOHEXOL 300 MG/ML  SOLN COMPARISON:  None Available. FINDINGS: Osseous: Apical lucency noted about the root of the right first mandibular premolar. This communicates with the alveolar foramen. No acute or healing fracture is present. No focal osseous lesion is present. Orbits: Bilateral lens replacements are noted. Globes and orbits are otherwise unremarkable. Sinuses: Mild mucosal thickening is present at the right maxillary sinus. The paranasal sinuses and mastoid air cells are otherwise clear. Soft tissues: Marked inflammatory changes surround the right mandible with extension into the right side of the face and neck. Thickening of the right platysma is noted. Reactive right level 2 lymph nodes are present. No discrete abscess is present. Limited intracranial: Within normal limits. IMPRESSION: 1. Marked inflammatory changes surround the right mandible with extension into  the right side of the face and neck. 2. No discrete abscess. 3. Apical lucency about the root of the right first mandibular premolar communicates with the alveolar foramen is likely the source of the inflammatory changes. 4. Reactive right level 2 lymph nodes. 5. Mild right maxillary sinus disease. Electronically Signed   By: Marin Roberts M.D.   On: 06/30/2023 18:25   DG Chest Port 1 View Result Date: 06/30/2023 CLINICAL DATA:  Hypoxia EXAM: PORTABLE CHEST 1 VIEW COMPARISON:  08/29/2010 chest radiograph. High-resolution chest CT of 11/19/2018. FINDINGS: Loop recorder projecting over the left lower chest wall. Moderate to marked right hemidiaphragm elevation. Midline trachea. Apical lordotic positioning. Mild cardiomegaly. No pleural effusion or pneumothorax. Marked interstitial coarsening is peripheral and basilar predominant, significantly progressive since 08/29/2010 plain film. Mildly progressive since the scout exams of 11/19/2018 high-resolution chest CT. No lobar consolidation. IMPRESSION: Low  lung volumes with interstitial lung disease, consistent with usual interstitial pneumonia pattern on on 11/19/2018 CT. This makes evaluation for superimposed acute findings challenging on AP radiograph. No convincing evidence of superimposed process. Aortic Atherosclerosis (ICD10-I70.0). Electronically Signed   By: Jeronimo Greaves M.D.   On: 06/30/2023 18:20    Procedures Procedures    Medications Ordered in ED Medications  Ampicillin-Sulbactam (UNASYN) 3 g in sodium chloride 0.9 % 100 mL IVPB (0 g Intravenous Stopped 06/30/23 1706)  iohexol (OMNIPAQUE) 300 MG/ML solution 75 mL (80 mLs Intravenous Contrast Given 06/30/23 1710)    ED Course/ Medical Decision Making/ A&P Clinical Course as of 06/30/23 2355  Sat Jun 30, 2023  1906 Dr Garvin Fila from dentistry has called and recommends transfer to [facility]. [RP]  1610 DNR/DNI confirmed with patient and her son.  [RP]  1939 Dr Smith Mince from ENT consulted and recommends transfer to [facility] for evaluation.  They will call back about placement. [RP]    Clinical Course User Index [RP] Rondel Baton, MD                                 Medical Decision Making Amount and/or Complexity of Data Reviewed Labs: ordered. Radiology: ordered.  Risk Prescription drug management.   TRINETTA ALEMU is a 89 y.o. female with comorbidities that complicate the patient evaluation including atrial fibrillation on Xarelto, interstitial lung disease, hypertension, and multiple dental fillings who presents emergency department with dental pain and facial swelling.    Initial Ddx:  Dental abscess, facial cellulitis, Ludwig's angina, deep space infection  MDM/Course:  Patient presents emergency department with right-sided facial swelling.  Also has some tooth pain.  Initially was concerned about a dental abscess.  Does not have any brawny edema underneath her tongue.  No signs of airway compromise at this time.  Was given antibiotics and had a CT scan which  did show what appears to be an odontogenic infection with extension into the face and neck.  Did discuss this with the patient and her son.  They would be amenable to the procedure but would like to try to avoid invasive surgery.  Discussed with our on-call dentist who recommended transfer to another facility to have her evaluated for tooth extraction.  Did discuss with the patient and her son how this can oftentimes become life-threatening if it develops Ludwig angina.  They expressed that they understood but would like for her to be DNR/DNI.  Discussed with ENT from [facility] who has accepted the patient for transfer.  Patient also was noted to be hypoxic which I suspect is from her interstitial lung disease rather than from her dental infection.  Upon re-evaluation was stable.  This patient presents to the ED for concern of complaints listed in HPI, this involves an extensive number of treatment options, and is a complaint that carries with it a high risk of complications and morbidity. Disposition including potential need for admission considered.   Dispo: Transfer to [facility]  Additional history obtained from son Records reviewed Outpatient Clinic Notes The following labs were independently interpreted: Chemistry and show no acute abnormality I independently reviewed the following imaging with scope of interpretation limited to determining acute life threatening conditions related to emergency care:  CT max face  and agree with the radiologist interpretation with the following exceptions: none I personally reviewed and interpreted the pt's EKG: see above for interpretation  I have reviewed the patients home medications and made adjustments as needed Consults:  dentistry Social Determinants of health:  elderly  Portions of this note were generated with Scientist, clinical (histocompatibility and immunogenetics). Dictation errors may occur despite best attempts at proofreading.      Final Clinical Impression(s) / ED  Diagnoses Final diagnoses:  Hypoxia  Dental infection    Rx / DC Orders ED Discharge Orders     None         Rondel Baton, MD 06/30/23 2355

## 2023-07-05 LAB — CULTURE, BLOOD (ROUTINE X 2)
Culture: NO GROWTH
Special Requests: ADEQUATE
Special Requests: ADEQUATE

## 2023-09-14 ENCOUNTER — Encounter (HOSPITAL_COMMUNITY): Payer: Self-pay

## 2023-09-14 ENCOUNTER — Emergency Department (HOSPITAL_COMMUNITY)

## 2023-09-14 ENCOUNTER — Observation Stay (HOSPITAL_COMMUNITY)
Admission: EM | Admit: 2023-09-14 | Discharge: 2023-09-19 | Disposition: A | Discharge status: Skilled Nursing Facility | Attending: Emergency Medicine | Admitting: Emergency Medicine

## 2023-09-14 ENCOUNTER — Other Ambulatory Visit: Payer: Self-pay

## 2023-09-14 DIAGNOSIS — R54 Age-related physical debility: Secondary | ICD-10-CM | POA: Diagnosis not present

## 2023-09-14 DIAGNOSIS — F419 Anxiety disorder, unspecified: Secondary | ICD-10-CM

## 2023-09-14 DIAGNOSIS — B961 Klebsiella pneumoniae [K. pneumoniae] as the cause of diseases classified elsewhere: Secondary | ICD-10-CM | POA: Insufficient documentation

## 2023-09-14 DIAGNOSIS — R627 Adult failure to thrive: Secondary | ICD-10-CM | POA: Insufficient documentation

## 2023-09-14 DIAGNOSIS — R630 Anorexia: Secondary | ICD-10-CM | POA: Diagnosis not present

## 2023-09-14 DIAGNOSIS — R634 Abnormal weight loss: Secondary | ICD-10-CM | POA: Insufficient documentation

## 2023-09-14 DIAGNOSIS — E46 Unspecified protein-calorie malnutrition: Secondary | ICD-10-CM | POA: Diagnosis not present

## 2023-09-14 DIAGNOSIS — R531 Weakness: Secondary | ICD-10-CM | POA: Diagnosis not present

## 2023-09-14 DIAGNOSIS — R29898 Other symptoms and signs involving the musculoskeletal system: Secondary | ICD-10-CM | POA: Insufficient documentation

## 2023-09-14 DIAGNOSIS — M79661 Pain in right lower leg: Secondary | ICD-10-CM

## 2023-09-14 DIAGNOSIS — E039 Hypothyroidism, unspecified: Secondary | ICD-10-CM | POA: Diagnosis not present

## 2023-09-14 DIAGNOSIS — E43 Unspecified severe protein-calorie malnutrition: Secondary | ICD-10-CM | POA: Diagnosis not present

## 2023-09-14 DIAGNOSIS — I4891 Unspecified atrial fibrillation: Secondary | ICD-10-CM | POA: Diagnosis not present

## 2023-09-14 DIAGNOSIS — Z1152 Encounter for screening for COVID-19: Secondary | ICD-10-CM | POA: Insufficient documentation

## 2023-09-14 DIAGNOSIS — J9601 Acute respiratory failure with hypoxia: Secondary | ICD-10-CM | POA: Diagnosis not present

## 2023-09-14 DIAGNOSIS — R350 Frequency of micturition: Secondary | ICD-10-CM | POA: Insufficient documentation

## 2023-09-14 DIAGNOSIS — J8410 Pulmonary fibrosis, unspecified: Secondary | ICD-10-CM | POA: Diagnosis not present

## 2023-09-14 DIAGNOSIS — R8271 Bacteriuria: Secondary | ICD-10-CM | POA: Diagnosis not present

## 2023-09-14 DIAGNOSIS — R32 Unspecified urinary incontinence: Secondary | ICD-10-CM | POA: Diagnosis not present

## 2023-09-14 DIAGNOSIS — R0602 Shortness of breath: Secondary | ICD-10-CM | POA: Diagnosis present

## 2023-09-14 DIAGNOSIS — N3000 Acute cystitis without hematuria: Principal | ICD-10-CM

## 2023-09-14 DIAGNOSIS — G3184 Mild cognitive impairment, so stated: Secondary | ICD-10-CM | POA: Diagnosis not present

## 2023-09-14 DIAGNOSIS — R4189 Other symptoms and signs involving cognitive functions and awareness: Secondary | ICD-10-CM | POA: Insufficient documentation

## 2023-09-14 DIAGNOSIS — E538 Deficiency of other specified B group vitamins: Secondary | ICD-10-CM | POA: Insufficient documentation

## 2023-09-14 DIAGNOSIS — N390 Urinary tract infection, site not specified: Secondary | ICD-10-CM | POA: Diagnosis not present

## 2023-09-14 DIAGNOSIS — J849 Interstitial pulmonary disease, unspecified: Secondary | ICD-10-CM

## 2023-09-14 LAB — COMPREHENSIVE METABOLIC PANEL WITH GFR
ALT: 23 U/L (ref 0–44)
AST: 37 U/L (ref 15–41)
Albumin: 2.4 g/dL — ABNORMAL LOW (ref 3.5–5.0)
Alkaline Phosphatase: 40 U/L (ref 38–126)
Anion gap: 15 (ref 5–15)
BUN: 28 mg/dL — ABNORMAL HIGH (ref 8–23)
CO2: 23 mmol/L (ref 22–32)
Calcium: 9.1 mg/dL (ref 8.9–10.3)
Chloride: 95 mmol/L — ABNORMAL LOW (ref 98–111)
Creatinine, Ser: 0.61 mg/dL (ref 0.44–1.00)
GFR, Estimated: >60 mL/min (ref >60)
Glucose, Bld: 107 mg/dL — ABNORMAL HIGH (ref 70–99)
Potassium: 4.2 mmol/L (ref 3.5–5.1)
Sodium: 133 mmol/L — ABNORMAL LOW (ref 135–145)
Total Bilirubin: 0.7 mg/dL (ref 0.0–1.2)
Total Protein: 7.5 g/dL (ref 6.5–8.1)

## 2023-09-14 LAB — CBC
HCT: 41.7 % (ref 36.0–46.0)
Hemoglobin: 13.1 g/dL (ref 12.0–15.0)
MCH: 26.3 pg (ref 26.0–34.0)
MCHC: 31.4 g/dL (ref 30.0–36.0)
MCV: 83.7 fL (ref 80.0–100.0)
Platelets: 383 10*3/uL (ref 150–400)
RBC: 4.98 MIL/uL (ref 3.87–5.11)
RDW: 16.2 % — ABNORMAL HIGH (ref 11.5–15.5)
WBC: 13.2 10*3/uL — ABNORMAL HIGH (ref 4.0–10.5)
nRBC: 0 % (ref 0.0–0.2)

## 2023-09-14 LAB — URINALYSIS, W/ REFLEX TO CULTURE (INFECTION SUSPECTED)
Bilirubin Urine: NEGATIVE
Glucose, UA: NEGATIVE mg/dL
Hgb urine dipstick: NEGATIVE
Ketones, ur: NEGATIVE mg/dL
Nitrite: POSITIVE — AB
Protein, ur: 30 mg/dL — AB
Specific Gravity, Urine: 1.013 (ref 1.005–1.030)
pH: 6 (ref 5.0–8.0)

## 2023-09-14 LAB — TROPONIN I (HIGH SENSITIVITY)
Troponin I (High Sensitivity): 11 ng/L (ref <18)
Troponin I (High Sensitivity): 10 ng/L (ref <18)

## 2023-09-14 LAB — LACTIC ACID, PLASMA
Lactic Acid, Venous: 1.7 mmol/L (ref 0.5–1.9)
Lactic Acid, Venous: 0.9 mmol/L (ref 0.5–1.9)

## 2023-09-14 LAB — RESP PANEL BY RT-PCR (RSV, FLU A&B, COVID)  RVPGX2
Influenza A by PCR: NEGATIVE
Influenza B by PCR: NEGATIVE
Resp Syncytial Virus by PCR: NEGATIVE
SARS Coronavirus 2 by RT PCR: NEGATIVE

## 2023-09-14 LAB — CBG MONITORING, ED: Glucose-Capillary: 118 mg/dL — ABNORMAL HIGH (ref 70–99)

## 2023-09-14 LAB — BRAIN NATRIURETIC PEPTIDE: B Natriuretic Peptide: 76.6 pg/mL (ref 0.0–100.0)

## 2023-09-14 MED ORDER — SODIUM CHLORIDE 0.9 % IV BOLUS
500.0000 mL | Freq: Once | INTRAVENOUS | Status: AC
  Administered 2023-09-14: 500 mL via INTRAVENOUS

## 2023-09-14 MED ORDER — ROSUVASTATIN CALCIUM 5 MG PO TABS
10.0000 mg | ORAL_TABLET | Freq: Every day | ORAL | Status: DC
  Administered 2023-09-17 – 2023-09-19 (×3): 10 mg via ORAL
  Filled 2023-09-14 (×5): qty 2

## 2023-09-14 MED ORDER — SODIUM CHLORIDE 0.9 % IV SOLN
500.0000 mg | Freq: Once | INTRAVENOUS | Status: AC
  Administered 2023-09-14: 500 mg via INTRAVENOUS
  Filled 2023-09-14: qty 5

## 2023-09-14 MED ORDER — RIVAROXABAN 10 MG PO TABS
10.0000 mg | ORAL_TABLET | Freq: Every day | ORAL | Status: DC
  Filled 2023-09-14 (×5): qty 1

## 2023-09-14 MED ORDER — ACETAMINOPHEN 325 MG PO TABS
650.0000 mg | ORAL_TABLET | Freq: Once | ORAL | Status: AC
  Administered 2023-09-14: 650 mg via ORAL
  Filled 2023-09-14: qty 2

## 2023-09-14 MED ORDER — BOOST / RESOURCE BREEZE PO LIQD CUSTOM
1.0000 | Freq: Three times a day (TID) | ORAL | Status: DC
  Administered 2023-09-14 – 2023-09-16 (×5): 1 via ORAL

## 2023-09-14 MED ORDER — SODIUM CHLORIDE 0.9 % IV SOLN
1.0000 g | Freq: Once | INTRAVENOUS | Status: AC
  Administered 2023-09-14: 1 g via INTRAVENOUS
  Filled 2023-09-14: qty 10

## 2023-09-14 MED ORDER — SODIUM CHLORIDE 0.9 % IV SOLN
1.0000 g | INTRAVENOUS | Status: AC
  Administered 2023-09-15 – 2023-09-16 (×2): 1 g via INTRAVENOUS
  Filled 2023-09-14 (×2): qty 10

## 2023-09-14 MED ORDER — LEVOTHYROXINE SODIUM 75 MCG PO TABS: 150.0000 ug | ORAL_TABLET | ORAL | Status: DC

## 2023-09-14 NOTE — Hospital Course (Addendum)
 Acute Hypoxic Respiratory Failure  Interstitial lung disease Patient presented to the emergency department with acute hypoxic respiratory failure after she had a saturation of 89% at home during a physical therapy session. After negative workup for acute causes, we suspect that her acute hypoxic respiratory failure is due to the progression of her interstitial lung disease especially with her history of gradual worsening of her dyspnea with exertion.  I have a low suspicion for community acquired pneumonia at this time, patient has remained afebrile, there is no cough, and chest x-ray does not show a focal consolidation but redemonstrates bilateral fibrotic changes.  On exam, there are fine bibasilar crackles present and patient was saturating >95% on 2L [place]. She ambulated with physical therapy and desatted on room air, but was able to maintain 93% on 2L [place].  She has remained stable for discharge with 1 liters of home O2 and outpatient follow up with pulmonology.    Goals of care Moderate Cognitive Impairment  Patient and son's goal is to remain at home, out of the hospital as much as possible. In the past they were denied personal living facility/ALF. We discussed PACE as an option for additional assistance at home. She does have PT at home and an aide that helps her 4 hours a day for 4 days. Her son lives close by and sees her frequently. Patient has long term care insurance as well per son. She has a DNR/DNI. Palliative care was consulted, who spoke with the family about goals of care and provided them with a MOST form.   Lower Extremity Weakness and Pain Patient presented with complaints of 3 weeks of progressive leg weakness and to the bilateral lower extremities. History is limited by her inability to verbally describe or localized her pain. Exam showed weakness more pronounced in the right lower extremity and 1+ pitting edema with erythema in the left lower extremity.  DVT ultrasound study showed no  evidence of DVT in the bilateral lower extremities.  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 Although the left lower extremity had some erythema and edema, there were no rashes consistent with cellulitis. The weakness is asymmetrical and seems more related to general functional decline than an acute process. X-rays of the left hip and knee did not show any acute injuries. PT evaluated her and recommended placement at a skilled nursing facility to regain her strength, which was approved by her insurance.   Anxiety Per son, she has a history of anxiety but feels it worsens recently, especially before she gets up and moving. Started hydroxyzine 10 mg, which he says helped, but made her a more somnolent today and is a high fall risk given her deconditioning, so we switched her to another Buspar 5 mg BID for discharge.   Vitamin B12 deficiency We assessed for vitamin deficiencies and found her B12 to be low. She received 1000 mcg IM B12, then was started on daily 1000 mcg.   Urinary tract infection, Klebsiella Patient presented with symptoms of UTI including increased urinary frequency.  Urinalysis findings of nitrite, leukocytes, and bacteria consistent with UTI. Culture grew klebsiella. She received ceftriaxone  for 3 days with improvement of symptoms.   Protein-calorie Malnutrition  Patient has poor oral intake that may be related to both tooth extraction and failure to thrive. Recommend continued meal supplementation outpatient.   Hypothyroidism TSH was 0.189 on admission. Synthroid  decreased to 137 mcg M-F.  Atrial Fibrilaltion  Stable.  By choice, she is not on anticoagulation.

## 2023-09-14 NOTE — H&P (Addendum)
 Date: 09/14/2023               Patient Name:  Kayla Wells MRN: [id number]  DOB: 1930-02-22 Age / Sex: 89 y.o., female   PCP: Rosslyn Coons, MD         Medical Service: Internal Medicine Teaching Service         Attending Physician: Dr. Alwin Baars Aretha Kubas, MD      First Contact: Dr. Priscila Arellano Zameza, MD      Second Contact: Dr. Jearldine Mina, DO          After Hours (After 5p/  First Contact Pager: [phone]  weekends / holidays): Second Contact Pager: [phone]   SUBJECTIVE   Chief Complaint: Weakness, shortness of breath   History of Present Illness: Kayla Wells is a 89 year old female with a past medical history of interstitial lung disease, hypothyroidism, paroxysmal atrial fibrillation not on Xarelto , numerous TIAs, hypertension who presented to the emergency department today for evaluation of shortness of breath and weakness.  Patient's son [name] was at bedside who provided the majority of the history.  He stated that 3 weeks ago his mother's left lower extremity abruptly became weak and had associated swelling as well.  Over the past week, the right lower extremity became abruptly weak as well, the right lower extremity is reported as more weak than the left side.  Patient did report that she has bilateral paresthesias/numbness from the knees down.  Family does not report changes in cognition, she is not oriented to time.  Family also denies changes with speech such as dysarthria.  She denies low back pain, does have a history of urinary incontinence which is chronic.  She also reports fecal incontinence, but patient's family believes that it is due to her functional status.  The patient's weakness became severe enough to have physical therapy evaluate her at home.  When physical therapy was evaluating the patient today, the patient became very short of breath and EMS was called for they found an oxygen saturation of 89%.  Patient denies any fever, chills, cough.  She denies  dyspnea at rest, but she does report a chronic dyspnea with exertion.  Patient also reports that her dyspnea with exertion has gradually worsened.  She denies a history of flares of interstitial lung disease.  She last saw Dr. Baldwin Levee in 2020 and has not been on therapy for her interstitial lung disease. She denies use of oxygen at home.  Additionally, patient does report an increase in urinary frequency, but denies dysuria or suprapubic tenderness.  Review of Systems: A complete ROS was negative except as per HPI.   ED Course: Patient was brought in by EMS.  A head CT was obtained which showed no acute intracranial abnormality.  Chest x-ray showed extensive peripheral interstitial accentuation favoring fibrosis.  Preliminary report of DVT study show no evidence of DVT in the bilateral lower extremities. Pertinent labs include: -WBC 13.2 -Lactic acid 1.7--> 0.9 -BNP 76.6 -Urinalysis with nitrite, leukocytes, and bacteria present  Past Medical History: ILD HTN Hypothyroidism PAF TIAs  Meds:  Synthroid  150mcg 5 days a week, Monday-Friday  Crestor  10mg   Allergies: Allergies as of 09/14/2023 - Review Complete 09/14/2023  Allergen Reaction Noted   Fish oil     Macrodantin [nitrofurantoin macrocrystal] Other (See Comments) 04/16/2012    Past Surgical History:  Procedure Laterality Date   ABDOMINAL HYSTERECTOMY  1978   EP IMPLANTABLE DEVICE N/A 02/03/2016   Procedure: Loop Recorder Insertion;  Surgeon:  Jolly Needle, MD;  Location: [facility];  Service: Cardiovascular;  Laterality: N/A;   foot susrgery     NASAL SINUS SURGERY     REVISION TOTAL KNEE ARTHROPLASTY     rotator cuff surgery Left 2014   TOTAL HIP ARTHROPLASTY     right   VESICOVAGINAL FISTULA CLOSURE W/ TAH  1978    Social:  Lives With: Herself, has an aide that comes in to help Support:Son Level of Function:uses walker at home, not ambulatory for the past 3 weeks PCP:Dr. [place] Martin Substances:Denies Tobacco,  alcohol, drug use   Family History:  Family History  Problem Relation Age of Onset   Heart disease Father    Stroke Father    Tuberculosis Maternal Grandfather    Atopy Neg Hx     OBJECTIVE:   Physical Exam: Blood pressure 112/61, pulse 87, temperature 98.4 F (36.9 C), temperature source Oral, resp. rate (!) 22, height 5\' 1"  (1.549 m), weight 56.7 kg, SpO2 97%.  Constitutional: Thin elderly female in no acute distress, Son Tim at bedside  HENT: normocephalic atraumatic, mucous membranes moist Cardiovascular: regular rate and rhythm, no m/r/g Pulmonary/Chest: Bilateral bibasilar crackles, normal work of breathing on 2.5L [place], she was able to maintain saturations>92% on RA during exam, no accessory muscle use  Abdominal: soft, non-distended. Some LUQ tenderness to deep palpation, no tenderness with light palpation  Neurological: alert & oriented to self and place (name and hospital), No obvious deficits in CN II-XII  MSK:  -LUE 5/5 motor strength -RUE 5/5 motor strength  -LLE 4/5, able to pick leg off bed with some resistance, 2/5 dorsiflexion, 5/5 plantar flexion -RLE: 3/5, able to pick leg off bed with gravity only, 5/5 plantar and dorsiflexion  -Sensation changes extending to the bilateral patella's -Babinski sign negative bilaterally   Skin: warm and dry Psych: Normal mood and affect  Labs:    Latest Ref Rng & Units 09/14/2023    2:46 PM 06/30/2023    3:59 PM 01/02/2022    6:09 PM  CBC  WBC 4.0 - 10.5 K/uL 13.2  14.3  8.3   Hemoglobin 12.0 - 15.0 g/dL 09.8  11.9  14.7   Hematocrit 36.0 - 46.0 % 41.7  42.0  41.8   Platelets 150 - 400 K/uL 383  388  332        Latest Ref Rng & Units 09/14/2023    2:46 PM 06/30/2023    3:59 PM 01/02/2022    6:09 PM  CMP  Glucose 70 - 99 mg/dL 829  562  130   BUN 8 - 23 mg/dL 28  19  28    Creatinine 0.44 - 1.00 mg/dL 8.65  7.84  6.96   Sodium 135 - 145 mmol/L 133  134  135   Potassium 3.5 - 5.1 mmol/L 4.2  3.8  3.7   Chloride 98 - 111  mmol/L 95  95  98   CO2 22 - 32 mmol/L 23  29  26    Calcium  8.9 - 10.3 mg/dL 9.1  9.2  9.6   Total Protein 6.5 - 8.1 g/dL 7.5     Total Bilirubin 0.0 - 1.2 mg/dL 0.7     Alkaline Phos 38 - 126 U/L 40     AST 15 - 41 U/L 37     ALT 0 - 44 U/L 23         Imaging: CT Head Wo Contrast Result Date: 09/14/2023 CLINICAL DATA:  Mental status change  EXAM: CT HEAD WITHOUT CONTRAST TECHNIQUE: Contiguous axial images were obtained from the base of the skull through the vertex without intravenous contrast. RADIATION DOSE REDUCTION: This exam was performed according to the departmental dose-optimization program which includes automated exposure control, adjustment of the mA and/or kV according to patient size and/or use of iterative reconstruction technique. COMPARISON:  CT 06/30/2023 FINDINGS: Brain: No acute territorial infarction, hemorrhage, or intracranial mass. Atrophy and mild chronic small vessel ischemic changes of the white matter. Non enlarged ventricles Vascular: No hyperdense vessels.  Carotid vascular calcification Skull: Normal. Negative for fracture or focal lesion. Sinuses/Orbits: No acute finding. Other: None IMPRESSION: 1. No CT evidence for acute intracranial abnormality. 2. Atrophy and mild chronic small vessel ischemic changes of the white matter. Electronically Signed   By: Esmeralda Hedge M.D.   On: 09/14/2023 16:38   VAS US  LOWER EXTREMITY VENOUS (DVT) (7a-7p) Result Date: 09/14/2023  Lower Venous DVT Study Patient Name:  MIAMI LATULIPPE  Date of Exam:   09/14/2023 Medical Rec #: [id number]       Accession #:    [id number] Date of Birth: September 30, 1929        Patient Gender: F Patient Age:   21 years Exam Location:  [facility] Procedure:      VAS US  LOWER EXTREMITY VENOUS (DVT) Referring Phys: [facility] --------------------------------------------------------------------------------  Indications: Pain, and Swelling.  Limitations: Poor ultrasound/tissue interface. Comparison Study:  Previous exam (LLEV) on 07/03/2018 was negative for DVT. Performing Technologist: Arlyce Berger RVT, RDMS  Examination Guidelines: A complete evaluation includes B-mode imaging, spectral Doppler, color Doppler, and power Doppler as needed of all accessible portions of each vessel. Bilateral testing is considered an integral part of a complete examination. Limited examinations for reoccurring indications may be performed as noted. The reflux portion of the exam is performed with the patient in reverse Trendelenburg.  +---------+---------------+---------+-----------+----------+-------------------+ RIGHT    CompressibilityPhasicitySpontaneityPropertiesThrombus Aging      +---------+---------------+---------+-----------+----------+-------------------+ CFV      Full           Yes      Yes                                      +---------+---------------+---------+-----------+----------+-------------------+ SFJ      Full                                                             +---------+---------------+---------+-----------+----------+-------------------+ FV Prox  Full           Yes      Yes                                      +---------+---------------+---------+-----------+----------+-------------------+ FV Mid   Full           Yes      Yes                                      +---------+---------------+---------+-----------+----------+-------------------+ FV DistalFull           Yes  Yes                                      +---------+---------------+---------+-----------+----------+-------------------+ PFV      Full                                                             +---------+---------------+---------+-----------+----------+-------------------+ POP      Full           Yes      Yes                                      +---------+---------------+---------+-----------+----------+-------------------+ PTV                                                    Not well visualized +---------+---------------+---------+-----------+----------+-------------------+ PERO     Full                                                             +---------+---------------+---------+-----------+----------+-------------------+   +---------+---------------+---------+-----------+----------+-------------------+ LEFT     CompressibilityPhasicitySpontaneityPropertiesThrombus Aging      +---------+---------------+---------+-----------+----------+-------------------+ CFV      Full           Yes      Yes                                      +---------+---------------+---------+-----------+----------+-------------------+ SFJ      Full                                                             +---------+---------------+---------+-----------+----------+-------------------+ FV Prox  Full           Yes      Yes                                      +---------+---------------+---------+-----------+----------+-------------------+ FV Mid   Full           Yes      Yes                                      +---------+---------------+---------+-----------+----------+-------------------+ FV DistalFull           Yes      Yes                                      +---------+---------------+---------+-----------+----------+-------------------+  PFV      Full                                                             +---------+---------------+---------+-----------+----------+-------------------+ POP      Full           Yes      Yes                                      +---------+---------------+---------+-----------+----------+-------------------+ PTV                                                   Not well visualized +---------+---------------+---------+-----------+----------+-------------------+ PERO     Full                                                              +---------+---------------+---------+-----------+----------+-------------------+     Summary: BILATERAL: - No evidence of deep vein thrombosis seen in the lower extremities, bilaterally. - RIGHT: -cystic structures found in the popliteal fossa, largest measuring (4.11 x 0.99 x 2.85 cm). Subcutaneous edema noted in the calf.  LEFT: - No cystic structure found in the popliteal fossa.  *See table(s) above for measurements and observations.    Preliminary    DG Chest 2 View Result Date: 09/14/2023 CLINICAL DATA:  Shortness of breath EXAM: CHEST - 2 VIEW COMPARISON:  06/30/2023 FINDINGS: Low lung volumes are present, causing crowding of the pulmonary vasculature. Extensive peripheral interstitial accentuation persists and favors fibrosis. Confluent density in the vicinity of the minor fissure especially laterally. As before, gas beneath the right hemidiaphragm is believed to be within bowel. Atherosclerotic calcification of the aortic arch. Bony demineralization noted. Loop recorder noted. Airway thickening is present, suggesting bronchitis or reactive airways disease. Thoracolumbar spondylosis. Severe degenerative glenohumeral arthropathy on the left with evidence of chondrocalcinosis in the glenohumeral joints. IMPRESSION: 1. Low lung volumes, causing crowding of the pulmonary vasculature. 2. Extensive peripheral interstitial accentuation favors fibrosis. 3. Airway thickening is present, suggesting bronchitis or reactive airways disease. 4. Severe degenerative glenohumeral arthropathy on the left with evidence of chondrocalcinosis in the glenohumeral joints. 5. Bony demineralization. 6. Aortic Atherosclerosis (ICD10-I70.0). Electronically Signed   By: Freida Jes M.D.   On: 09/14/2023 15:15      EKG: personally reviewed my interpretation is sinus tachycardia with PAC.   ASSESSMENT & PLAN:   Assessment & Plan by Problem: Principal Problem:   Acute hypoxic respiratory failure (HCC) Active Problems:    PULMONARY FIBROSIS ILD POST INFLAMMATORY CHRONIC   Lower extremity weakness   Loss of weight   Decreased appetite   FTT (failure to thrive) in adult   Bacteriuria with pyuria   Urinary frequency   Urinary incontinence   Frailty   Protein-calorie malnutrition (HCC)   Moderate cognitive impairment   ZIVA NUNZIATA is a 89 y.o. person living with a history  of interstitial lung disease, hypothyroidism, paroxysmal atrial fibrillation not on Xarelto , numerous TIAs, hypertension who presented to the emergency department today for evaluation of shortness of breath and weakness and admitted for acute hypoxic respiratory failure on hospital day 0  Acute Hypoxic Respiratory Failure  ILD Patient presented to the emergency department with acute hypoxic respiratory failure after she had a saturation of 89% at home during a physical therapy session.  I suspect that her acute hypoxic respiratory failure is due to the progression of her interstitial lung disease especially with her history of gradual worsening of her dyspnea with exertion.  I have a low suspicion for community acquired pneumonia at this time, patient has remained afebrile, there is no cough, and chest x-ray does not show a focal consolidation.  On exam, there are fine bibasilar crackles present and patient was saturating >90% on RA.  During her brief conversation with patient's son, he did express interest with palliative care consult for GOC conversation and questions regarding hospice.  Plan: -Will not continue azithromycin  -CT imaging of the lungs will not change our management moving forward, will not be ordering CT imaging of chest -Wean off oxygen as tolerated - Patient will need an ambulatory pulse oximetry tomorrow morning to assess need of oxygen at home especially since she has dyspnea with exertion -Palliative care consult placed  -Continuous pulse oximetry   GOC Frailty  Moderate Cognitive Impairment  Patient and son's goal  is to remain at home. In the past they were denied personal living facility/ALF. We discussed PACE as an option for additional assistance at home. She does have PT at home and an aide that helps her 4 hours a day for 4 days. Her son lives close by and sees her frequently. Patient has long term care insurance as well per son. Son is interested in palliative care consult and hospice; although, I do need see an acute need for hospice at this time. She has a DNR/DNI. Plan: -Palliative care consulted -DNR/DNI -Would benefit form a MOST form   Lower Extremity Weakness Leg swelling Neuropathy Patient has complaints of 3 weeks of leg weakness and sensory changes to the bilateral lower extremities.  Exam shows weakness more pronounced in the right lower extremity and 1+ pitting edema with erythema in the left lower extremity.  DVT ultrasound study preliminary report shows no evidence of DVT in the bilateral lower extremities.  DVT ultrasound does show cystic structures found in the right popliteal fossa, but no cystic structures found in the left popliteal fossa which would not explain the symmetrical neuropathy. Her neuropathy symptoms are symmetrical and in a stocking distrubution, with her poor oral intake, will assess vitamin deficiencies. Patient's leg swelling could be attributed to her albumin deficiency, BNP was fortunately within normal limits. Although, the left lower extremity had some erythema and edema, there were no rashes consistent with cellulitis. The weakness is asymmetrical and there is not new changes with the urinary incontinence and her fecal incontinence is more from functional status. Head CT did not show acute hemorrhage. Based on timeline of symptoms, if this were an ischemic stroke, she is not in the time period for thrombolytics. An MRI at this time would not change management.  Plan: -PT, OT -Will order B12, B9, TSH studies for neuropathy symptoms   UTI Patient presents with  symptoms of UTI including increased urinary frequency.  Urinalysis findings of nitrite, leukocytes, and bacteria consistent with UTI. -Will continue ceftriaxone  for 3 days - Urine culture  in process, will need followed up  Leukocytosis  Patient presented with a leukocytosis of 13.2 this is likely due to UTI. Plan: - Daily CBC  Tooth Extraction Protein-calorie Malnutrition  Decreased Appetite  Patient has poor oral intake that may be related to both tooth extraction and failure to thrive. Plan: -Registered dietician consulted -SLP  -Boost supplemental drinks ordered   Hypothyroidism Will continue home Synthroid  at 150 mcg every Monday through Friday (5 days a week)  Atrial Fibrilaltion  Patient is in normal sinus rhythm on exam.  By choice, she is not on anticoagulation at this time.  Diet: Normal VTE: NOAC Code: DNR/DNI  Prior to Admission Living Arrangement: Home, living by self Anticipated Discharge Location: Home Barriers to Discharge: Treatment of acute hypoxic respiratory failure  Dispo: Admit patient to Observation with expected length of stay less than 2 midnights.  Signed:   Aurora Lees, DO Internal Medicine Resident PGY-1 09/14/2023, 10:23 PM   Please contact the on call pager at [phone].

## 2023-09-14 NOTE — ED Triage Notes (Signed)
 Pt with generalized weakness and increased SOB since Wednesday from home. Pt lives with family. Chronic resp failure hx and pulmonary fibrosis, no oxygen use at baseline. Pt reporting feeling weak, no pain. Pt states she feels to anxious to talk but will answer verbally if needed. Oriented to person and place.

## 2023-09-14 NOTE — Progress Notes (Signed)
 BLE venous duplex has been completed.  Preliminary results given to Christian Prosperi, PA-C.   Results can be found under chart review under CV PROC. 09/14/2023 4:01 PM Simrit Gohlke RVT, RDMS

## 2023-09-14 NOTE — ED Notes (Signed)
 ED TO INPATIENT HANDOFF REPORT  ED Nurse Name and Phone #: Nerissa Bannister [phone]  S Name/Age/Gender Kayla Wells 89 y.o. female Room/Bed: 040C/040C  Code Status   Code Status: Limited: Do not attempt resuscitation (DNR) -DNR-LIMITED -Do Not Intubate/DNI   Home/SNF/Other Home Patient oriented to: self and place Is this baseline? Yes   Triage Complete: Triage complete  Chief Complaint Acute hypoxic respiratory failure (HCC) [J96.01]  Triage Note Pt with generalized weakness and increased SOB since Wednesday from home. Pt lives with family. Chronic resp failure hx and pulmonary fibrosis, no oxygen use at baseline. Pt reporting feeling weak, no pain. Pt states she feels to anxious to talk but will answer verbally if needed. Oriented to person and place.   Allergies Allergies  Allergen Reactions   Fish Oil    Macrodantin [Nitrofurantoin Macrocrystal] Other (See Comments)    Advised by MD not to take this med anymore   Other Other (See Comments)    Level of Care/Admitting Diagnosis ED Disposition     ED Disposition  Admit   Condition  --   Comment  Hospital Area: [facility] [[id number]]  Level of Care: Med-Surg [16]  May place patient in observation at [facility] or [facility] if equivalent level of care is available:: Yes  Covid Evaluation: Confirmed COVID Negative  Diagnosis: Acute hypoxic respiratory failure [facility]) [[id number]]  Admitting Physician: Sandie Cross [[id number]]  Attending Physician: HATCHER, JEFFREY C [[id number]]          B Medical/Surgery History Past Medical History:  Diagnosis Date   Arthritis    Cervical stenosis (uterine cervix)    Esophageal stricture    GERD (gastroesophageal reflux disease)    Hypothyroidism    Osteopenia    Other and unspecified hyperlipidemia    Pulmonary fibrosis (HCC)    Unspecified essential hypertension    Past Surgical History:  Procedure Laterality Date   ABDOMINAL HYSTERECTOMY  1978   EP  IMPLANTABLE DEVICE N/A 02/03/2016   Procedure: Loop Recorder Insertion;  Surgeon: Jolly Needle, MD;  Location: [facility] INVASIVE CV LAB;  Service: Cardiovascular;  Laterality: N/A;   foot susrgery     NASAL SINUS SURGERY     REVISION TOTAL KNEE ARTHROPLASTY     rotator cuff surgery Left 2014   TOTAL HIP ARTHROPLASTY     right   VESICOVAGINAL FISTULA CLOSURE W/ TAH  1978     A IV Location/Drains/Wounds Patient Lines/Drains/Airways Status     Active Line/Drains/Airways     Name Placement date Placement time Site Days   Peripheral IV 09/14/23 18 G Right Antecubital 09/14/23  1345  Antecubital  less than 1   Peripheral IV 09/14/23 20 G Distal;Left;Posterior Forearm 09/14/23  1611  Forearm  less than 1            Intake/Output Last 24 hours  Intake/Output Summary (Last 24 hours) at 09/14/2023 2115 Last data filed at 09/14/2023 2106 Gross per 24 hour  Intake 504.76 ml  Output --  Net 504.76 ml    Labs/Imaging Results for orders placed or performed during the hospital encounter of 09/14/23 (from the past 48 hours)  Resp panel by RT-PCR (RSV, Flu A&B, Covid) Anterior Nasal Swab     Status: None   Collection Time: 09/14/23  2:15 PM   Specimen: Anterior Nasal Swab  Result Value Ref Range   SARS Coronavirus 2 by RT PCR NEGATIVE NEGATIVE   Influenza A by PCR NEGATIVE NEGATIVE   Influenza B  by PCR NEGATIVE NEGATIVE    Comment: (NOTE) The Xpert Xpress SARS-CoV-2/FLU/RSV plus assay is intended as an aid in the diagnosis of influenza from Nasopharyngeal swab specimens and should not be used as a sole basis for treatment. Nasal washings and aspirates are unacceptable for Xpert Xpress SARS-CoV-2/FLU/RSV testing.  Fact Sheet for Patients: BloggerCourse.com  Fact Sheet for Healthcare Providers: SeriousBroker.it  This test is not yet approved or cleared by the [place]  FDA and has been authorized for detection and/or diagnosis of  SARS-CoV-2 by FDA under an Emergency Use Authorization (EUA). This EUA will remain in effect (meaning this test can be used) for the duration of the COVID-19 declaration under Section 564(b)(1) of the Act, 21 U.S.C. section 360bbb-3(b)(1), unless the authorization is terminated or revoked.     Resp Syncytial Virus by PCR NEGATIVE NEGATIVE    Comment: (NOTE) Fact Sheet for Patients: BloggerCourse.com  Fact Sheet for Healthcare Providers: SeriousBroker.it  This test is not yet approved or cleared by the [place]  FDA and has been authorized for detection and/or diagnosis of SARS-CoV-2 by FDA under an Emergency Use Authorization (EUA). This EUA will remain in effect (meaning this test can be used) for the duration of the COVID-19 declaration under Section 564(b)(1) of the Act, 21 U.S.C. section 360bbb-3(b)(1), unless the authorization is terminated or revoked.  Performed at [facility], [address], [place], [place] [address]   POC CBG, ED     Status: Abnormal   Collection Time: 09/14/23  2:36 PM  Result Value Ref Range   Glucose-Capillary 118 (H) 70 - 99 mg/dL    Comment: Glucose reference range applies only to samples taken after fasting for at least 8 hours.  Comprehensive metabolic panel     Status: Abnormal   Collection Time: 09/14/23  2:46 PM  Result Value Ref Range   Sodium 133 (L) 135 - 145 mmol/L   Potassium 4.2 3.5 - 5.1 mmol/L   Chloride 95 (L) 98 - 111 mmol/L   CO2 23 22 - 32 mmol/L   Glucose, Bld 107 (H) 70 - 99 mg/dL    Comment: Glucose reference range applies only to samples taken after fasting for at least 8 hours.   BUN 28 (H) 8 - 23 mg/dL   Creatinine, Ser 2.95 0.44 - 1.00 mg/dL   Calcium 9.1 8.9 - 62.1 mg/dL   Total Protein 7.5 6.5 - 8.1 g/dL   Albumin 2.4 (L) 3.5 - 5.0 g/dL   AST 37 15 - 41 U/L   ALT 23 0 - 44 U/L   Alkaline Phosphatase 40 38 - 126 U/L   Total Bilirubin 0.7 0.0 - 1.2 mg/dL    GFR, Estimated >30 >86 mL/min    Comment: (NOTE) Calculated using the CKD-EPI Creatinine Equation (2021)    Anion gap 15 5 - 15    Comment: Performed at [facility], [address], [place], [place] [address]  CBC     Status: Abnormal   Collection Time: 09/14/23  2:46 PM  Result Value Ref Range   WBC 13.2 (H) 4.0 - 10.5 K/uL   RBC 4.98 3.87 - 5.11 MIL/uL   Hemoglobin 13.1 12.0 - 15.0 g/dL   HCT 96.2 95.2 - 84.1 %   MCV 83.7 80.0 - 100.0 fL   MCH 26.3 26.0 - 34.0 pg   MCHC 31.4 30.0 - 36.0 g/dL   RDW 32.4 (H) 40.1 - 02.7 %   Platelets 383 150 - 400 K/uL  nRBC 0.0 0.0 - 0.2 %    Comment: Performed at [facility], [address] [place], [place] [address]  Troponin I (High Sensitivity)     Status: None   Collection Time: 09/14/23  2:46 PM  Result Value Ref Range   Troponin I (High Sensitivity) 11 <18 ng/L    Comment: (NOTE) Elevated high sensitivity troponin I (hsTnI) values and significant  changes across serial measurements may suggest ACS but many other  chronic and acute conditions are known to elevate hsTnI results.  Refer to the "Links" section for chest pain algorithms and additional  guidance. Performed at [facility], [address], [place], [place] [address]   Lactic acid, plasma     Status: None   Collection Time: 09/14/23  2:46 PM  Result Value Ref Range   Lactic Acid, Venous 1.7 0.5 - 1.9 mmol/L    Comment: Performed at [facility], [address], [place], [place] [address]  Lactic acid, plasma     Status: None   Collection Time: 09/14/23  5:25 PM  Result Value Ref Range   Lactic Acid, Venous 0.9 0.5 - 1.9 mmol/L    Comment: Performed at [facility], [address] [place], [place] [address]  Brain natriuretic peptide     Status: None   Collection Time: 09/14/23  5:25 PM  Result Value Ref Range   B Natriuretic Peptide 76.6 0.0 - 100.0 pg/mL    Comment: Performed at [facility], [address], [place],  [place] [address]  Troponin I (High Sensitivity)     Status: None   Collection Time: 09/14/23  5:25 PM  Result Value Ref Range   Troponin I (High Sensitivity) 10 <18 ng/L    Comment: (NOTE) Elevated high sensitivity troponin I (hsTnI) values and significant  changes across serial measurements may suggest ACS but many other  chronic and acute conditions are known to elevate hsTnI results.  Refer to the "Links" section for chest pain algorithms and additional  guidance. Performed at [facility], [address] [place], [place] [address]   Urinalysis, w/ Reflex to Culture (Infection Suspected) -Urine, Clean Catch     Status: Abnormal   Collection Time: 09/14/23  5:34 PM  Result Value Ref Range   Specimen Source URINE, CLEAN CATCH    Color, Urine YELLOW YELLOW   APPearance HAZY (A) CLEAR   Specific Gravity, Urine 1.013 1.005 - 1.030   pH 6.0 5.0 - 8.0   Glucose, UA NEGATIVE NEGATIVE mg/dL   Hgb urine dipstick NEGATIVE NEGATIVE   Bilirubin Urine NEGATIVE NEGATIVE   Ketones, ur NEGATIVE NEGATIVE mg/dL   Protein, ur 30 (A) NEGATIVE mg/dL   Nitrite POSITIVE (A) NEGATIVE   Leukocytes,Ua TRACE (A) NEGATIVE   RBC / HPF 0-5 0 - 5 RBC/hpf   WBC, UA 11-20 0 - 5 WBC/hpf    Comment:        Reflex urine culture not performed if WBC <=10, OR if Squamous epithelial cells >5. If Squamous epithelial cells >5 suggest recollection.    Bacteria, UA MANY (A) NONE SEEN   Squamous Epithelial / HPF 0-5 0 - 5 /HPF   WBC Clumps PRESENT    Mucus PRESENT     Comment: Performed at [facility], [address] [place], [place] [address]  Urine Culture     Status: None (Preliminary result)   Collection Time: 09/14/23  5:34 PM  Specimen: Urine, Clean Catch  Result Value Ref Range   Specimen Description URINE, CLEAN CATCH    Special Requests      NONE Reflexed from [id number] Performed at [facility], [address], [place], [place] [address]    Culture PENDING    Report Status PENDING     CT Head Wo Contrast Result Date: 09/14/2023 CLINICAL DATA:  Mental status change EXAM: CT HEAD WITHOUT CONTRAST TECHNIQUE: Contiguous axial images were obtained from the base of the skull through the vertex without intravenous contrast. RADIATION DOSE REDUCTION: This exam was performed according to the departmental dose-optimization program which includes automated exposure control, adjustment of the mA and/or kV according to patient size and/or use of iterative reconstruction technique. COMPARISON:  CT 06/30/2023 FINDINGS: Brain: No acute territorial infarction, hemorrhage, or intracranial mass. Atrophy and mild chronic small vessel ischemic changes of the white matter. Non enlarged ventricles Vascular: No hyperdense vessels.  Carotid vascular calcification Skull: Normal. Negative for fracture or focal lesion. Sinuses/Orbits: No acute finding. Other: None IMPRESSION: 1. No CT evidence for acute intracranial abnormality. 2. Atrophy and mild chronic small vessel ischemic changes of the white matter. Electronically Signed   By: Esmeralda Hedge M.D.   On: 09/14/2023 16:38   VAS US  LOWER EXTREMITY VENOUS (DVT) (7a-7p) Result Date: 09/14/2023  Lower Venous DVT Study Patient Name:  Kayla Wells  Date of Exam:   09/14/2023 Medical Rec #: [id number]       Accession #:    [id number] Date of Birth: 11/04/1929        Patient Gender: F Patient Age:   64 years Exam Location:  [facility] Procedure:      VAS US  LOWER EXTREMITY VENOUS (DVT) Referring Phys: [facility] MEREDITH --------------------------------------------------------------------------------  Indications: Pain, and Swelling.  Limitations: Poor ultrasound/tissue interface. Comparison Study: Previous exam (LLEV) on 07/03/2018 was negative for DVT. Performing Technologist: Arlyce Berger RVT, RDMS  Examination Guidelines: A complete evaluation includes B-mode imaging, spectral Doppler, color Doppler, and power Doppler as needed of all accessible portions of each  vessel. Bilateral testing is considered an integral part of a complete examination. Limited examinations for reoccurring indications may be performed as noted. The reflux portion of the exam is performed with the patient in reverse Trendelenburg.  +---------+---------------+---------+-----------+----------+-------------------+ RIGHT    CompressibilityPhasicitySpontaneityPropertiesThrombus Aging      +---------+---------------+---------+-----------+----------+-------------------+ CFV      Full           Yes      Yes                                      +---------+---------------+---------+-----------+----------+-------------------+ SFJ      Full                                                             +---------+---------------+---------+-----------+----------+-------------------+ FV Prox  Full           Yes      Yes                                      +---------+---------------+---------+-----------+----------+-------------------+ FV Mid   Full  Yes      Yes                                      +---------+---------------+---------+-----------+----------+-------------------+ FV DistalFull           Yes      Yes                                      +---------+---------------+---------+-----------+----------+-------------------+ PFV      Full                                                             +---------+---------------+---------+-----------+----------+-------------------+ POP      Full           Yes      Yes                                      +---------+---------------+---------+-----------+----------+-------------------+ PTV                                                   Not well visualized +---------+---------------+---------+-----------+----------+-------------------+ PERO     Full                                                             +---------+---------------+---------+-----------+----------+-------------------+    +---------+---------------+---------+-----------+----------+-------------------+ LEFT     CompressibilityPhasicitySpontaneityPropertiesThrombus Aging      +---------+---------------+---------+-----------+----------+-------------------+ CFV      Full           Yes      Yes                                      +---------+---------------+---------+-----------+----------+-------------------+ SFJ      Full                                                             +---------+---------------+---------+-----------+----------+-------------------+ FV Prox  Full           Yes      Yes                                      +---------+---------------+---------+-----------+----------+-------------------+ FV Mid   Full           Yes      Yes                                      +---------+---------------+---------+-----------+----------+-------------------+  FV DistalFull           Yes      Yes                                      +---------+---------------+---------+-----------+----------+-------------------+ PFV      Full                                                             +---------+---------------+---------+-----------+----------+-------------------+ POP      Full           Yes      Yes                                      +---------+---------------+---------+-----------+----------+-------------------+ PTV                                                   Not well visualized +---------+---------------+---------+-----------+----------+-------------------+ PERO     Full                                                             +---------+---------------+---------+-----------+----------+-------------------+     Summary: BILATERAL: - No evidence of deep vein thrombosis seen in the lower extremities, bilaterally. - RIGHT: -cystic structures found in the popliteal fossa, largest measuring (4.11 x 0.99 x 2.85 cm). Subcutaneous edema noted in the calf.   LEFT: - No cystic structure found in the popliteal fossa.  *See table(s) above for measurements and observations.    Preliminary    DG Chest 2 View Result Date: 09/14/2023 CLINICAL DATA:  Shortness of breath EXAM: CHEST - 2 VIEW COMPARISON:  06/30/2023 FINDINGS: Low lung volumes are present, causing crowding of the pulmonary vasculature. Extensive peripheral interstitial accentuation persists and favors fibrosis. Confluent density in the vicinity of the minor fissure especially laterally. As before, gas beneath the right hemidiaphragm is believed to be within bowel. Atherosclerotic calcification of the aortic arch. Bony demineralization noted. Loop recorder noted. Airway thickening is present, suggesting bronchitis or reactive airways disease. Thoracolumbar spondylosis. Severe degenerative glenohumeral arthropathy on the left with evidence of chondrocalcinosis in the glenohumeral joints. IMPRESSION: 1. Low lung volumes, causing crowding of the pulmonary vasculature. 2. Extensive peripheral interstitial accentuation favors fibrosis. 3. Airway thickening is present, suggesting bronchitis or reactive airways disease. 4. Severe degenerative glenohumeral arthropathy on the left with evidence of chondrocalcinosis in the glenohumeral joints. 5. Bony demineralization. 6. Aortic Atherosclerosis (ICD10-I70.0). Electronically Signed   By: Freida Jes M.D.   On: 09/14/2023 15:15    Pending Labs Unresulted Labs (From admission, onward)     Start     Ordered   09/15/23 0500  CBC  Tomorrow morning,   R        09/14/23 2022   09/15/23 0500  Basic metabolic panel with GFR  Tomorrow morning,   R        09/14/23 2022   09/15/23 0500  Magnesium  Tomorrow morning,   R        09/14/23 2022   09/14/23 1436  Blood culture (routine x 2)  BLOOD CULTURE X 2,   R (with STAT occurrences),   Status:  Canceled      09/14/23 1436            Vitals/Pain Today's Vitals   09/14/23 2015 09/14/23 2030 09/14/23 2050  09/14/23 2059  BP: 118/70 (!) 123/102  124/81  Pulse: 80 88  85  Resp: (!) 23 18  18   Temp:      TempSrc:      SpO2: 96% 95%  97%  Weight:      Height:      PainSc:  0-No pain 0-No pain 0-No pain    Isolation Precautions No active isolations  Medications Medications  rivaroxaban  (XARELTO ) tablet 10 mg (has no administration in time range)  feeding supplement (BOOST / RESOURCE BREEZE) liquid 1 Container (has no administration in time range)  cefTRIAXone (ROCEPHIN) 1 g in sodium chloride  0.9 % 100 mL IVPB (has no administration in time range)  rosuvastatin (CRESTOR) tablet 10 mg (has no administration in time range)  levothyroxine (SYNTHROID) tablet 150 mcg (has no administration in time range)  acetaminophen  (TYLENOL ) tablet 650 mg (650 mg Oral Given 09/14/23 1558)  sodium chloride  0.9 % bolus 500 mL (0 mLs Intravenous Stopped 09/14/23 2106)  cefTRIAXone (ROCEPHIN) 1 g in sodium chloride  0.9 % 100 mL IVPB (0 g Intravenous Stopped 09/14/23 1753)  azithromycin (ZITHROMAX) 500 mg in sodium chloride  0.9 % 250 mL IVPB (0 mg Intravenous Stopped 09/14/23 1708)    Mobility walks with device     Focused Assessments Pulmonary Assessment Handoff:  Lung sounds: Bilateral Breath Sounds: Clear L Breath Sounds: Clear R Breath Sounds: Diminished O2 Device: Nasal Cannula O2 Flow Rate (L/min): 2 L/min    R Recommendations: See Admitting Provider Note  Report given to:   Additional Notes: Son at bedside.

## 2023-09-14 NOTE — ED Provider Notes (Signed)
 Accepted handoff at shift change from [facility]. Please see prior provider note for more detail.   Briefly: Patient is 89 y.o.   DDX: concern for Just starting workup, generalized weakness, shortness of breath for the last week. 86-87% o2 sat at home prior to arrival. Lungs sounded clear on exam. Suprapubic ttp on exam. Concern for UTI. Some left weakness / swelling for weeks, now on right? Likely admit for acute hypoxic respiratory failure 2/2 likely pneumonia.  Plan: Consulted with IM teaching service will plan to admit for acute hypoxic respiratory failure, UTI  Labs and Imaging: Initial lactic acid normal 1.7, CBG normal range for nonfasting value, 118.  Clinical chest x-ray with significant interstitial lung disease, some inflammation consistent with bronchitis or reactive lung disease consistent with infectious picture.  Will begin presumptive antibiotics for pneumonia vs UTI  UA does show positive nitrate, many bacteria and white blood cell clumps consistent with acute urinary tract infection especially in context of her suprapubic tenderness.     Nelly Banco, PA-C 09/14/23 1855    Lowery Rue, DO 09/14/23 2249

## 2023-09-14 NOTE — ED Provider Notes (Signed)
 [facility] Provider Note   CSN: [id number] Arrival date & time: 09/14/23  1343     History  Chief Complaint  Patient presents with   Weakness   Shortness of Breath    Kayla Wells is a 89 y.o. female with PMHx OA, GERD, Hypothyroidism, pulmonary fibrosis, TIA who presents to the ED concerned for generalized weakness and SOB progressing over the past week. Family member at bedside stating that patient is chronically with O2 sats between 89-94% on RA - she does not use supplemental O2 at baseline. PT noting O2 around 87% on RA earlier today. Patient also with a couple episodes of coughing this morning with deep inspirations - no other episodes of coughing. Family member also noting LE swelling that initially started with left leg swelling and then progressed to right leg swelling as well. Family member also stating that patient was having right leg weakness for a couple of weeks, but started having left leg weakness around 2 days ago.   Denies fever, chest pain, nausea, vomiting, diarrhea, dysuria, hematuria.   Weakness Associated symptoms: shortness of breath   Shortness of Breath      Home Medications Prior to Admission medications   Medication Sig Start Date End Date Taking? Authorizing Provider  acetaminophen  (TYLENOL ) 500 MG tablet Take 500-1,000 mg by mouth every 6 (six) hours as needed for mild pain (depends on pain).    [provider]  ALPRAZolam (XANAX) 0.25 MG tablet Take 0.25 mg by mouth daily as needed. For anxiety    [provider]  B Complex Vitamins (B-COMPLEX/B-12 PO) Take 1 tablet by mouth daily.    [provider]  fluticasone (FLONASE) 50 MCG/ACT nasal spray Place 2 sprays into the nose every evening.    [provider]  hydrochlorothiazide (MICROZIDE) 12.5 MG capsule Take 12.5 mg by mouth daily.    [provider]  irbesartan (AVAPRO) 150 MG tablet Take 150 mg by mouth  daily.    [provider]  iron polysaccharides (NIFEREX) 150 MG capsule Take 150 mg by mouth daily. 12/04/21   [provider]  levothyroxine (SYNTHROID) 150 MCG tablet Take 150 mcg by mouth daily before breakfast. Monday-Friday    [provider]  oxymetazoline (AFRIN) 0.05 % nasal spray Place 2 sprays into the nose every evening.    [provider]  polyethylene glycol (MIRALAX / GLYCOLAX) packet Take 17 g by mouth daily as needed for mild constipation.  Patient not taking: Reported on 02/01/2022    [provider]  Rivaroxaban  (XARELTO ) 15 MG TABS tablet TAKE 1 TABLET BY MOUTH EVERY DAY WITH SUPPER 12/05/21   Allred, Royston Cornea, MD  rosuvastatin (CRESTOR) 10 MG tablet Take 10 mg by mouth daily.    [provider]  traMADol (ULTRAM) 50 MG tablet Take 50 mg by mouth every 6 (six) hours as needed for moderate pain.  Patient not taking: Reported on 02/01/2022    [provider]  Vitamin D, Ergocalciferol, (DRISDOL) 1.25 MG (50000 UNIT) CAPS capsule Take 50,000 Units by mouth 2 (two) times a week. 11/29/21   [provider]      Allergies    Fish oil, Macrodantin [nitrofurantoin macrocrystal], and Other    Review of Systems   Review of Systems  Respiratory:  Positive for shortness of breath.   Neurological:  Positive for weakness.    Physical Exam Updated Vital Signs BP (!) 147/90 (BP Location: Right Arm)  Pulse 96   Temp 100 F (37.8 C) (Oral)   Resp 20   Ht 5\' 1"  (1.549 m)   Wt 56.7 kg   SpO2 96%   BMI 23.62 kg/m  Physical Exam Vitals and nursing note reviewed.  Constitutional:      General: She is not in acute distress.    Appearance: She is not ill-appearing or toxic-appearing.  HENT:     Head: Normocephalic and atraumatic.     Mouth/Throat:     Mouth: Mucous membranes are moist.     Pharynx: No posterior oropharyngeal erythema.  Eyes:     General: No scleral icterus.       Right eye: No discharge.         Left eye: No discharge.     Conjunctiva/sclera: Conjunctivae normal.  Cardiovascular:     Rate and Rhythm: Normal rate and regular rhythm.     Pulses: Normal pulses.     Heart sounds: No murmur heard. Pulmonary:     Effort: Pulmonary effort is normal. No respiratory distress.     Breath sounds: Normal breath sounds. No wheezing, rhonchi or rales.  Abdominal:     General: Bowel sounds are normal.     Palpations: Abdomen is soft. There is no mass.     Tenderness: There is abdominal tenderness.     Comments: Suprapubic tenderness to palpation.  Musculoskeletal:     Right lower leg: No edema.     Left lower leg: No edema.     Comments: +2 pedal pulses.   Skin:    General: Skin is warm and dry.     Findings: No rash.  Neurological:     General: No focal deficit present.     Mental Status: She is alert. Mental status is at baseline.     Comments: GCS 15. Speech is goal oriented. No deficits appreciated to CN III-XII; symmetric eyebrow raise, no facial drooping, tongue midline. Slight decreased strength in right LE when compared to left LE. Grip strength intact BL. Patient moves extremities without ataxia.    Psychiatric:        Mood and Affect: Mood normal.     ED Results / Procedures / Treatments   Labs (all labs ordered are listed, but only abnormal results are displayed) Labs Reviewed  CBG MONITORING, ED - Abnormal; Notable for the following components:      Result Value   Glucose-Capillary 118 (*)    All other components within normal limits  RESP PANEL BY RT-PCR (RSV, FLU A&B, COVID)  RVPGX2  CULTURE, BLOOD (ROUTINE X 2)  CULTURE, BLOOD (ROUTINE X 2)  COMPREHENSIVE METABOLIC PANEL WITH GFR  CBC  URINALYSIS, W/ REFLEX TO CULTURE (INFECTION SUSPECTED)  LACTIC ACID, PLASMA  LACTIC ACID, PLASMA  BRAIN NATRIURETIC PEPTIDE  TROPONIN I (HIGH SENSITIVITY)    EKG EKG Interpretation Date/Time:  Friday Sep 14 2023 14:37:40 EDT Ventricular Rate:  103 PR Interval:  199 QRS  Duration:  93 QT Interval:  332 QTC Calculation: 397 R Axis:   -78  Text Interpretation: Sinus tachycardia Atrial premature complexes Left anterior fascicular block Anterior infarct, old Borderline T abnormalities, inferior leads when compared to prior, more irregular. No STEMI Confirmed by Wynell Heath ([id number]) on 09/14/2023 2:48:18 PM  Radiology No results found.  Procedures Procedures    Medications Ordered in ED Medications  acetaminophen  (TYLENOL ) tablet 650 mg (has no administration in time range)    ED Course/ Medical Decision Making/ A&P Clinical Course as  of 09/14/23 1507  Fri Sep 14, 2023  1504 Just starting workup, generalized weakness, shortness of breath for the last week. 86-87% o2 sat at home prior to arrival. Lungs sounded clear on exam. Suprapubic ttp on exam. Concern for UTI. Some left weakness / swelling for weeks, now on right? Likely admit for acute hypoxic respiratory failure 2/2 likely pneumonia. [CP]    Clinical Course User Index [CP] Nelly Banco, PA-C                                 Medical Decision Making Amount and/or Complexity of Data Reviewed Labs: ordered. Radiology: ordered.   This patient presents to the ED for concern of shortness of breath, this involves an extensive number of treatment options, and is a complaint that carries with it a high risk of complications and morbidity.  The differential diagnosis includes Anxiety, Anaphylaxis/Angioedema, Aspirated FB, Arrhythmia, CHF, Asthma, COPD, PNA, COVID/Flu/RSV, STEMI, Tamponade, TPNX, Sepsis   Co morbidities that complicate the patient evaluation  OA, GERD, Hypothyroidism, pulmonary fibrosis, TIA   Additional history obtained:  12/2015 ECHO: 55-60%   Problem List / ED Course / Critical interventions / Medication management  Patient presents to ED concerned for SOB and weakness progressing over the past 1 week. Denies any other infectious symptoms. Physical exam with slight  weakness in left LE and suprapubic tenderness to palpation. Patient also noted to be hypoxic at 86% on RA when first arriving to ED. Patient now on 2L [place] with appropriate O2 sats.  I Ordered, and personally interpreted labs.  CBG 118. Rest of labs reassuring.  The patient was maintained on a cardiac monitor.  I personally viewed and interpreted the cardiac monitored which showed an underlying rhythm of: sinus rhythm - no acute changes I ordered imaging studies including chest xray to assess for process contributing to patient's symptoms.this imaging is pending. I have reviewed the patients home medicines and have made adjustments as needed  Social Determinants of Health:  geriatric   Care of ROSEZETTA SAMMUT  transferred to PA Prosperi at the end of my shift as the patient will require reassessment once labs/imaging have resulted. Patient presentation, ED course, and plan of care discussed with review of all pertinent labs and imaging. Please see his/her note for further details regarding further ED course and disposition. Plan at time of handoff is reassess patient after labs and imaging. Patient will probably need hospital admission for her new/worsening hypoxia requiring supplemental O2.  This may be altered or completely changed at the discretion of the oncoming team pending results of further workup.          Final Clinical Impression(s) / ED Diagnoses Final diagnoses:  None    Rx / DC Orders ED Discharge Orders     None         Mauldin Bureau, [place] 09/14/23 1507    Tegeler, Marine Sia, MD 09/14/23 1739

## 2023-09-15 DIAGNOSIS — J849 Interstitial pulmonary disease, unspecified: Secondary | ICD-10-CM

## 2023-09-15 DIAGNOSIS — N3000 Acute cystitis without hematuria: Secondary | ICD-10-CM | POA: Diagnosis not present

## 2023-09-15 DIAGNOSIS — J9601 Acute respiratory failure with hypoxia: Secondary | ICD-10-CM | POA: Diagnosis not present

## 2023-09-15 LAB — BASIC METABOLIC PANEL WITH GFR
Anion gap: 10 (ref 5–15)
BUN: 22 mg/dL (ref 8–23)
CO2: 25 mmol/L (ref 22–32)
Calcium: 8.5 mg/dL — ABNORMAL LOW (ref 8.9–10.3)
Chloride: 98 mmol/L (ref 98–111)
Creatinine, Ser: 0.52 mg/dL (ref 0.44–1.00)
GFR, Estimated: >60 mL/min (ref >60)
Glucose, Bld: 88 mg/dL (ref 70–99)
Potassium: 3.6 mmol/L (ref 3.5–5.1)
Sodium: 133 mmol/L — ABNORMAL LOW (ref 135–145)

## 2023-09-15 LAB — MAGNESIUM: Magnesium: 1.6 mg/dL — ABNORMAL LOW (ref 1.7–2.4)

## 2023-09-15 LAB — CBC
HCT: 35.9 % — ABNORMAL LOW (ref 36.0–46.0)
Hemoglobin: 11.6 g/dL — ABNORMAL LOW (ref 12.0–15.0)
MCH: 26.6 pg (ref 26.0–34.0)
MCHC: 32.3 g/dL (ref 30.0–36.0)
MCV: 82.3 fL (ref 80.0–100.0)
Platelets: 350 10*3/uL (ref 150–400)
RBC: 4.36 MIL/uL (ref 3.87–5.11)
RDW: 15.9 % — ABNORMAL HIGH (ref 11.5–15.5)
WBC: 10.2 10*3/uL (ref 4.0–10.5)
nRBC: 0 % (ref 0.0–0.2)

## 2023-09-15 LAB — TSH: TSH: 0.189 u[IU]/mL — ABNORMAL LOW (ref 0.350–4.500)

## 2023-09-15 LAB — FOLATE: Folate: 17.3 ng/mL (ref >5.9)

## 2023-09-15 LAB — VITAMIN B12: Vitamin B-12: 169 pg/mL — ABNORMAL LOW (ref 180–914)

## 2023-09-15 MED ORDER — MAGNESIUM SULFATE 2 GM/50ML IV SOLN
2.0000 g | Freq: Once | INTRAVENOUS | Status: AC
  Administered 2023-09-15: 2 g via INTRAVENOUS
  Filled 2023-09-15: qty 50

## 2023-09-15 MED ORDER — LEVOTHYROXINE SODIUM 25 MCG PO TABS
137.0000 ug | ORAL_TABLET | ORAL | Status: DC
  Administered 2023-09-17 – 2023-09-19 (×3): 137 ug via ORAL
  Filled 2023-09-15 (×3): qty 1

## 2023-09-15 MED ORDER — CYANOCOBALAMIN 1000 MCG/ML IJ SOLN
1000.0000 ug | Freq: Once | INTRAMUSCULAR | Status: AC
  Administered 2023-09-15: 1000 ug via INTRAMUSCULAR
  Filled 2023-09-15: qty 1

## 2023-09-15 MED ORDER — ACETAMINOPHEN 325 MG PO TABS
650.0000 mg | ORAL_TABLET | Freq: Four times a day (QID) | ORAL | Status: DC | PRN
  Administered 2023-09-15: 650 mg via ORAL
  Filled 2023-09-15: qty 2

## 2023-09-15 NOTE — Plan of Care (Signed)
  Problem: Pain Managment: Goal: General experience of comfort will improve and/or be controlled Outcome: Progressing   Problem: Safety: Goal: Ability to remain free from injury will improve Outcome: Progressing   Problem: Skin Integrity: Goal: Risk for impaired skin integrity will decrease Outcome: Progressing

## 2023-09-15 NOTE — Evaluation (Signed)
 Clinical/Bedside Swallow Evaluation Patient Details  Name: Kayla Wells MRN: [id number] Date of Birth: [birth year]  Today's Date: 09/15/2023 Time: SLP Start Time (ACUTE ONLY): 1310 SLP Stop Time (ACUTE ONLY): 1325 SLP Time Calculation (min) (ACUTE ONLY): 15 min  Past Medical History:  Past Medical History:  Diagnosis Date   Arthritis    Cervical stenosis (uterine cervix)    Esophageal stricture    GERD (gastroesophageal reflux disease)    Hypothyroidism    Osteopenia    Other and unspecified hyperlipidemia    Pulmonary fibrosis (HCC)    Unspecified essential hypertension    Past Surgical History:  Past Surgical History:  Procedure Laterality Date   ABDOMINAL HYSTERECTOMY  1978   EP IMPLANTABLE DEVICE N/A 02/03/2016   Procedure: Loop Recorder Insertion;  Surgeon: Jolly Needle, MD;  Location: [facility] INVAS[facility];  Service: Cardiovascular;  Laterality: N/A;   foot susrgery     NASAL SINUS SURGERY     REVISION TOTAL KNEE ARTHROPLASTY     rotator cuff surgery Left 2014   TOTAL HIP ARTHROPLASTY     right   VESICOVAGINAL FISTULA CLOSURE W/ TAH  1978   HPI:  Pt is a 89 y.o. female who presented to the ED for evaluation of SOB and weakness and admitted for acute hypoxic respiratory failure. Pt has poor oral intake that may be related to both tooth extraction and failure to thrive. CT head- neg. CXR (09/14/23) significant for "Low lung volumes, causing crowding of the pulmonary vasculature. Extensive peripheral interstitial accentuation favors fibrosis. Airway thickening is present, suggesting bronchitis or reactive  airways disease". MBS was completed on 03/15/2016, which revealed normal oropharyngeal swallow. Reg diet/thin liquids recommended at that time. Meds whole in puree per pt preference. Palliative care consulted for GOC during this admission. PMH: interstitial lung disease, hypothyroidism, paroxysmal atrial fibrillation not on Xarelto , numerous TIAs, hypertension.    Assessment /  Plan / Recommendation  Clinical Impression  Pt presents with no overt s/sx of oropharyngeal dysphagia per this clinical swallow assessment. Daughter in law, at bedside, reports no difficulties swallowing a regular/soft food diet at baseline. Per family, pt typically consumes a good sized am meal and then will drink Ensures and small snacks as the day goes on. Oral motor examination was [facility]. Several R mandibular dentition were absent and pt/family report recent tooth extractions. No clinical s/sx of aspiration noted with consecutive swallowing of thin liquids via straw, x3 tsp of puree and bites of graham cracker. Munch-like mastication pattern noted with regular solid, however she cleared her oral cavity completely and in a timely manner. Recommend continue regular diet/thin liquids in order to provide pt with a variety of foods to choose from given her overall poor appetite and preference for certain meal items. Universal swallow precautions recommended. She would benefit from an RD consult. No SLP f/u recommended at this time. Will s/o.  SLP Visit Diagnosis: Dysphagia, unspecified (R13.10)    Aspiration Risk       Diet Recommendation Regular;Thin liquid    Liquid Administration via: Cup;Straw Medication Administration: Whole meds with liquid Supervision: Patient able to self feed;Full supervision/cueing for compensatory strategies Compensations: Minimize environmental distractions;Slow rate;Small sips/bites Postural Changes: Seated upright at 90 degrees    Other  Recommendations Recommended Consults:  (RD consult) Oral Care Recommendations: Oral care BID    Recommendations for follow up therapy are one component of a multi-disciplinary discharge planning process, led by the attending physician.  Recommendations may be updated based on patient  status, additional functional criteria and insurance authorization.  Follow up Recommendations No SLP follow up      Assistance Recommended at  Discharge    Functional Status Assessment Patient has not had a recent decline in their functional status  Frequency and Duration            Prognosis        Swallow Study   General Date of Onset: 09/14/23 HPI: Pt is a 89 y.o. female who presented to the ED for evaluation of SOB and weakness and admitted for acute hypoxic respiratory failure. Pt has poor oral intake that may be related to both tooth extraction and failure to thrive. CT head- neg. CXR (09/14/23) significant for "Low lung volumes, causing crowding of the pulmonary vasculature. Extensive peripheral interstitial accentuation favors fibrosis. Airway thickening is present, suggesting bronchitis or reactive  airways disease". MBS was completed on 03/15/2016, which revealed normal oropharyngeal swallow. Reg diet/thin liquids recommended at that time. Meds whole in puree per pt preference. Palliative care consulted for GOC during this admission. PMH: interstitial lung disease, hypothyroidism, paroxysmal atrial fibrillation not on Xarelto , numerous TIAs, hypertension. Type of Study: Bedside Swallow Evaluation Previous Swallow Assessment: see HPI Diet Prior to this Study: Regular;Thin liquids (Level 0) Temperature Spikes Noted: No Respiratory Status: Room air History of Recent Intubation: No Behavior/Cognition: Alert;Cooperative;Pleasant mood;Confused Oral Cavity Assessment: Within Functional Limits Oral Care Completed by SLP: No Oral Cavity - Dentition: Missing dentition (tooth extraction of mandibular dentition on R side) Vision: Functional for self-feeding Self-Feeding Abilities: Able to feed self Patient Positioning: Upright in bed;Postural control adequate for testing Baseline Vocal Quality: Normal Volitional Cough: Weak Volitional Swallow: Able to elicit    Oral/Motor/Sensory Function Overall Oral Motor/Sensory Function: Within functional limits   Ice Chips Ice chips: Not tested   Thin Liquid Thin Liquid: Within functional  limits Presentation: Self Fed;Straw    Nectar Thick Nectar Thick Liquid: Not tested   Honey Thick Honey Thick Liquid: Not tested   Puree Puree: Within functional limits Presentation: Self Fed;Spoon   Solid     Solid: Within functional limits Presentation: Self Fed       Gordon Latus, MA, CCC-SLP Acute Rehabilitation Services Office Number: [phone]  Corliss Dies 09/15/2023,1:43 PM

## 2023-09-15 NOTE — Care Management Obs Status (Signed)
 MEDICARE OBSERVATION STATUS NOTIFICATION   Patient Details  Name: Kayla Wells MRN: [id number] Date of Birth: 05/15/1929   Medicare Observation Status Notification Given:  Yes    Cindia Crease, RN 09/15/2023, 4:31 PM

## 2023-09-15 NOTE — Evaluation (Signed)
 Occupational Therapy Evaluation Patient Details Name: Kayla Wells MRN: [id number] DOB: 1929-10-16 Today's Date: 09/15/2023   History of Present Illness   Pt is a 89 y.o. female presented to [facility] 5/16 for SOB. C/o of bil LE weakness over past week.   CT head CT negative, chest CT showed low lung volume, airway thickening, concerns for fibrosis. PMH: OA, hypothyroidism, pulmonary fibrosis, TIA, a-fib     Clinical Impressions Pt admitted based on above, and was seen based on problem list below. PTA pt was receiving min to mod assistance with ADLs from caregiver and son. Today pt is requiring set up  to max  for ADLs. Bed mobility was mod +2 . Eval limited d/t pt declining to participate d/t pain. Despite max encouagement from therapy team and son, pt declined EOB activity. Pt would benefit from <3 hours of skilled rehab daily. If pt and family decline, then pt should d/c with the following DME below, and max HH services. If OT will continue to follow acutely to maximize functional independence.        If plan is discharge home, recommend the following:   Two people to help with walking and/or transfers;A lot of help with bathing/dressing/bathroom     Functional Status Assessment   Patient has had a recent decline in their functional status and demonstrates the ability to make significant improvements in function in a reasonable and predictable amount of time.     Equipment Recommendations   Other (comment);BSC/3in1;Tub/shower seat;Hospital bed;Hoyer lift;Wheelchair (measurements OT) (Defer to next venue)     Recommendations for Other Services         Precautions/Restrictions   Precautions Precautions: Fall Recall of Precautions/Restrictions: Impaired Restrictions Weight Bearing Restrictions Per Provider Order: No     Mobility Bed Mobility Overal bed mobility: Needs Assistance Bed Mobility: Rolling, Sidelying to Sit Rolling: Mod assist, +2 for  safety/equipment Sidelying to sit: Mod assist, +2 for physical assistance       General bed mobility comments: Pt adamantly refusing mobility despite max encouragement from therapy team and son.    Transfers Overall transfer level: Needs assistance       General transfer comment: Pt declines      Balance Overall balance assessment: Needs assistance       ADL either performed or assessed with clinical judgement   ADL Overall ADL's : Needs assistance/impaired Eating/Feeding: Set up;Bed level   Grooming: Set up;Bed level   Upper Body Bathing: Minimal assistance;Bed level   Lower Body Bathing: Maximal assistance;Bed level   Upper Body Dressing : Minimal assistance;Bed level   Lower Body Dressing: Maximal assistance;Bed level     General ADL Comments: Limited eval, pt declined OOB activities     Vision Baseline Vision/History: 0 No visual deficits Vision Assessment?: No apparent visual deficits            Pertinent Vitals/Pain Pain Assessment Pain Assessment: Faces Faces Pain Scale: Hurts even more Pain Location: BLEs, L>R Pain Descriptors / Indicators: Grimacing, Guarding, Discomfort, Moaning Pain Intervention(s): Limited activity within patient's tolerance     Extremity/Trunk Assessment Upper Extremity Assessment Upper Extremity Assessment: Generalized weakness   Lower Extremity Assessment Lower Extremity Assessment: Defer to PT evaluation RLE Deficits / Details: tolerates knee flexion to 90 deg, otherwise limited in exam given pain and guarding RLE: Unable to fully assess due to pain LLE Deficits / Details: tolerates active assist knee flexion to 25 deg, otherwise limited in exam given pain and guarding LLE: Unable to fully  assess due to pain   Cervical / Trunk Assessment Cervical / Trunk Assessment: Kyphotic   Communication Communication Communication: Impaired Factors Affecting Communication: Hearing impaired   Cognition Arousal:  Alert Behavior During Therapy: Flat affect Cognition: Difficult to assess             OT - Cognition Comments: Pt spoke limited words, required max encouragement for responses other than head nods or shakes                 Following commands: Impaired Following commands impaired: Follows one step commands with increased time, Follows one step commands inconsistently     Cueing  General Comments   Cueing Techniques: Verbal cues;Gestural cues  Pt son present and appears supportive           Home Living Family/patient expects to be discharged to:: Private residence Living Arrangements: Alone Available Help at Discharge: Family Type of Home: House Home Access: Stairs to enter Secretary/administrator of Steps: 4 Entrance Stairs-Rails: Right Home Layout: One level     Bathroom Shower/Tub: Producer, television/film/video: Standard Bathroom Accessibility: Yes How Accessible: Accessible via walker Home Equipment: Rollator (4 wheels)   Additional Comments: per pt's son, HHPT started this past week and are working on getting pt a 3in1      Prior Functioning/Environment Prior Level of Function : Needs assist     Mobility Comments: ambulatory with rollator, but has been trouble mobilizing at all over the past week ADLs Comments: aide 4 hours/day, 4 days/week to help with cooking, cleaning, and bathing. Son lives "basically next door"    OT Problem List: Decreased strength;Decreased activity tolerance;Impaired balance (sitting and/or standing)   OT Treatment/Interventions: Self-care/ADL training;Therapeutic exercise;DME and/or AE instruction;Energy conservation;Therapeutic activities;Patient/family education;Balance training      OT Goals(Current goals can be found in the care plan section)   Acute Rehab OT Goals Patient Stated Goal: To lay in bed OT Goal Formulation: With patient Time For Goal Achievement: 09/29/23 Potential to Achieve Goals: Good   OT  Frequency:  Min 1X/week    Co-evaluation PT/OT/SLP Co-Evaluation/Treatment: Yes Reason for Co-Treatment: For patient/therapist safety;To address functional/ADL transfers;Complexity of the patient's impairments (multi-system involvement)   OT goals addressed during session: ADL's and self-care      AM-PAC OT "6 Clicks" Daily Activity     Outcome Measure Help from another person eating meals?: None Help from another person taking care of personal grooming?: A Little Help from another person toileting, which includes using toliet, bedpan, or urinal?: Total Help from another person bathing (including washing, rinsing, drying)?: A Lot Help from another person to put on and taking off regular upper body clothing?: A Little Help from another person to put on and taking off regular lower body clothing?: A Lot 6 Click Score: 15   End of Session Nurse Communication: Mobility status  Activity Tolerance: Patient limited by pain Patient left: in bed;with call bell/phone within reach;with bed alarm set;with family/visitor present  OT Visit Diagnosis: Muscle weakness (generalized) (M62.81)                Time: 9528-4132 OT Time Calculation (min): 20 min Charges:  OT General Charges $OT Visit: 1 Visit  Delmer Ferraris, OT  Acute Rehabilitation Services Office [phone] Secure chat preferred   Mickael Alamo 09/15/2023, 1:14 PM

## 2023-09-15 NOTE — Progress Notes (Signed)
 Summary: Kayla Wells is a 89 yo with a history of interstitial lung disease, hypothyroidism, paroxysmal atrial fibrillation not on Xarelto , numerous TIAs, hypertension who presented to the emergency department today for evaluation of shortness of breath and weakness and admitted for acute hypoxic respiratory failure on hospital day 0.   Subjective:  Patient is resting in bed in no acute distress. She is oriented to self and city. Her son is at bedside. She was unable to get much sleep last night, endorses some leg tenderness that may be due to her positioning in bed. Denied chest pain, heart palpitations, headache, nausea, or vomiting. Denied any pain or burning with urination. Discussed palliative care consult, PACE recommendation for outpatient follow up, and physical therapy evaluation.   Objective:  Vital signs in last 24 hours: Vitals:   09/14/23 2159 09/15/23 0200 09/15/23 0511 09/15/23 0800  BP: 137/75 (!) (P) 159/85 128/74 (!) 140/71  Pulse: 85 (P) 82 86 72  Resp:   18 18  Temp: 98.5 F (36.9 C) (P) 98.1 F (36.7 C) 98.6 F (37 C) 99 F (37.2 C)  TempSrc: Oral (P) Oral Oral Oral  SpO2: 97% (P) 98% 92% 94%  Weight:      Height:          Latest Ref Rng & Units 09/15/2023    4:16 AM 09/14/2023    2:46 PM 06/30/2023    3:59 PM  CBC  WBC 4.0 - 10.5 K/uL 10.2  13.2  14.3   Hemoglobin 12.0 - 15.0 g/dL 08.6  57.8  46.9   Hematocrit 36.0 - 46.0 % 35.9  41.7  42.0   Platelets 150 - 400 K/uL 350  383  388        Latest Ref Rng & Units 09/15/2023    4:16 AM 09/14/2023    2:46 PM 06/30/2023    3:59 PM  BMP  Glucose 70 - 99 mg/dL 88  629  528   BUN 8 - 23 mg/dL 22  28  19    Creatinine 0.44 - 1.00 mg/dL 4.13  2.44  0.10   Sodium 135 - 145 mmol/L 133  133  134   Potassium 3.5 - 5.1 mmol/L 3.6  4.2  3.8   Chloride 98 - 111 mmol/L 98  95  95   CO2 22 - 32 mmol/L 25  23  29    Calcium  8.9 - 10.3 mg/dL 8.5  9.1  9.2      Physical Exam Constitutional: Patient is resting  comfortably in bed in no acute distress.  CV: Regular rate and rhythm, no LE edema.  Pulmonary/Respiratory: Normal respiratory effort on room air, lungs clear on auscultation.  Abdominal: Suprapubic tenderness with palpation, normal bowel sounds. MSK: Bilateral tenderness to palpation of both lower extremities, stable edema, R > L lower extremity weakness   Skin: Warm and dry. Psych: Normal mood and affect.  Assessment/Plan:  Principal Problem:   Acute hypoxic respiratory failure (HCC) Active Problems:   PULMONARY FIBROSIS ILD POST INFLAMMATORY CHRONIC   Lower extremity weakness   Loss of weight   Decreased appetite   FTT (failure to thrive) in adult   Bacteriuria with pyuria   Urinary frequency   Urinary incontinence   Frailty   Protein-calorie malnutrition (HCC)   Moderate cognitive impairment   ILD (interstitial lung disease) (HCC)  Acute Hypoxic Respiratory Failure  ILD Patient was observed doing well this morning on room air. Per her son, patient does not like wearing  her oxygen at home. Patient did not appear to be in any distress while talking and resting in the bed. Pulmonary exam was unremarkable. Will continue to monitor.  Plan - Oxygen as needed, wean off as possible - Follow up on ambulatory pulse ox   Deconditioning GOC Frailty  Moderate Cognitive Impairment  Patient has had ongoing deconditioning, especially in the last few weeks when son reports she has appeared to be weaker and has required more help than usual. Does have an aide that comes in to help her 4 days a week as well as home PT given that patient lives at home on her [place]. Her son lives close by and sees her frequently. Patient has long term care insurance as well per son. During admission patient's son expressed interest in a palliative care consult which was placed at the time. Also discussed PACE and available resources outpatient this morning. Will continue conversation regarding discharge once  palliative has been able to discuss with patient and her family. She has a DNR/DNI. Plan: - Palliative care consulted - Consider MOST form    Lower Extremity Weakness Leg swelling Neuropathy Bilateral leg weakness and associated swelling/tenderness stable today compared to admission exam. US  of lower extremities negative for DVT. BNP WNL. Head CT negative for hemorrhage. B12 noted to be low as well as magnesium. Both repleted today. PT/OT to evaluate and see patient while she is here.  Plan - Follow up on PT/OT recommendations  Medication induced hyperthyroidism TSH 0.189 (below normal), will adjust levothyroxine  therapy as patient may be experiencing medication induced hyperthyroidism.  Plan - DECREASE synthroid  150 mcg M/T//Th/F to 137 mcg - Follow up TSH outpatient in 6-8 weeks    UTI Patient presented with symptoms of UTI including increased urinary frequency. UA confirmed UTI, patient started on IV antibiotic. Endorsing suprapubic pain on today's exam. Urine culture pending.  - Will continue ceftriaxone  for 3 days (day 1)  - Follow up urine culture  Leukocytosis  Patient presented with a leukocytosis of 13.2 in the setting of UTI, WBC improved to 10.2 today. Patient remains afebrile.  Plan: - Daily CBC   Tooth Extraction Protein-calorie Malnutrition  Decreased Appetite  Patient has poor oral intake that may be related to both tooth extraction and failure to thrive. Plan: - Registered dietician consulted, follow up recommendations - Follow up SLP recommendations for diet  - Continue Boost drinks    Atrial Fibrilaltion  Patient is in normal sinus rhythm on exam.  By choice, she is not on anticoagulation at this time.   Diet: Normal VTE: NOAC Code: DNR/DNI  Prior to Admission Living Arrangement: Home Anticipated Discharge Location: Pending medical treatment and palliative consult.  Barriers to Discharge: Treatment for acute hypoxic respiratory failure and UTI treatment  with antibiotics.  Dispo: Anticipated discharge in approximately 1-2 day(s).   Arellano Zameza, Talani Brazee, MD, PGY-1 09/15/2023, 12:05 PM After 5pm on weekdays and 1pm on weekends: On Call pager [phone]

## 2023-09-15 NOTE — Evaluation (Signed)
 Physical Therapy Evaluation Patient Details Name: Kayla Wells MRN: [id number] DOB: 09-24-1929 Today's Date: 09/15/2023  History of Present Illness  Pt is a 89 y.o. female presented to [facility] 5/16 for SOB. C/o of bil LE weakness over past week.   CT head CT negative, chest CT showed low lung volume, airway thickening, concerns for fibrosis. PMH: OA, hypothyroidism, pulmonary fibrosis, TIA, a-fib  Clinical Impression   Pt presents with generalized weakness, LE pain L>R, impaired balance, and decreased activity tolerance. Pt to benefit from acute PT to address deficits. Pt requiring mod +2 assist for bed mobility, adamantly refused x2 attempts to help pt to transition into sitting EOB. Pt lives alone and does not have constant supervision. At present, Patient will benefit from continued inpatient follow up therapy, <3 hours/day. PT to progress mobility as tolerated, and will continue to follow acutely.          If plan is discharge home, recommend the following: Two people to help with walking and/or transfers;Two people to help with bathing/dressing/bathroom   Can travel by private vehicle        Equipment Recommendations None recommended by PT  Recommendations for Other Services       Functional Status Assessment Patient has had a recent decline in their functional status and demonstrates the ability to make significant improvements in function in a reasonable and predictable amount of time.     Precautions / Restrictions Precautions Precautions: Fall Restrictions Weight Bearing Restrictions Per Provider Order: No      Mobility  Bed Mobility Overal bed mobility: Needs Assistance Bed Mobility: Rolling, Sidelying to Sit Rolling: Mod assist, +2 for safety/equipment Sidelying to sit: Mod assist, +2 for physical assistance       General bed mobility comments: attempting log roll towards R with PT assisting trunk elevation and OT managing LEs but once initiating sidelying to sit pt  screaming "no no no" and adamantly refused further attempts at EOB or OOB even with max encoruagement from staff and pt's son. mod assist for roll bilat for pad change, boost up in bed with +2.    Transfers                   General transfer comment: pt declines    Ambulation/Gait                  Stairs            Wheelchair Mobility     Tilt Bed    Modified Rankin (Stroke Patients Only)       Balance Overall balance assessment: Needs assistance     Sitting balance - Comments: unable to assess       Standing balance comment: unable to assess                             Pertinent Vitals/Pain Pain Assessment Pain Assessment: Faces Faces Pain Scale: Hurts even more Pain Location: BLEs, L>R Pain Descriptors / Indicators: Grimacing, Guarding, Discomfort, Moaning Pain Intervention(s): Limited activity within patient's tolerance, Monitored during session, Repositioned    Home Living Family/patient expects to be discharged to:: Private residence Living Arrangements: Alone Available Help at Discharge: Family Type of Home: House Home Access: Stairs to enter Entrance Stairs-Rails: Right Entrance Stairs-Number of Steps: 4   Home Layout: One level Home Equipment: Rollator (4 wheels) Additional Comments: per pt's son, HHPT started this past week and are working on getting  pt a 3in1    Prior Function Prior Level of Function : Needs assist             Mobility Comments: ambulatory with rollator, but has been trouble mobilizing at all over the past week ADLs Comments: aide 4 hours/day, 4 days/week to help with cooking, cleaning, and bathing. Son lives "basically next door"     Extremity/Trunk Assessment   Upper Extremity Assessment Upper Extremity Assessment: Generalized weakness    Lower Extremity Assessment Lower Extremity Assessment: Defer to PT evaluation RLE Deficits / Details: tolerates knee flexion to 90 deg, otherwise  limited in exam given pain and guarding RLE: Unable to fully assess due to pain LLE Deficits / Details: tolerates active assist knee flexion to 25 deg, otherwise limited in exam given pain and guarding LLE: Unable to fully assess due to pain    Cervical / Trunk Assessment Cervical / Trunk Assessment: Kyphotic  Communication   Communication Communication: Impaired Factors Affecting Communication: Hearing impaired    Cognition Arousal: Alert Behavior During Therapy: Flat affect   PT - Cognitive impairments: Safety/Judgement, Problem solving, Sequencing                       PT - Cognition Comments: some inconsistent history giving, requiring pt's son to fill in and correct. Lacks insight into current functional deficits, and requires step-by-step cuing Following commands: Impaired Following commands impaired: Follows one step commands with increased time, Follows one step commands inconsistently     Cueing Cueing Techniques: Verbal cues, Gestural cues     General Comments General comments (skin integrity, edema, etc.): Pt son present and appears supportive    Exercises     Assessment/Plan    PT Assessment Patient needs continued PT services  PT Problem List Decreased strength;Decreased mobility;Decreased activity tolerance;Decreased balance;Decreased knowledge of use of DME;Pain;Decreased safety awareness;Decreased coordination       PT Treatment Interventions DME instruction;Therapeutic activities;Gait training;Therapeutic exercise;Patient/family education;Balance training;Stair training;Functional mobility training;Neuromuscular re-education    PT Goals (Current goals can be found in the Care Plan section)  Acute Rehab PT Goals PT Goal Formulation: With patient Time For Goal Achievement: 09/29/23 Potential to Achieve Goals: Good    Frequency Min 2X/week     Co-evaluation PT/OT/SLP Co-Evaluation/Treatment: Yes Reason for Co-Treatment: For  patient/therapist safety;To address functional/ADL transfers;Complexity of the patient's impairments (multi-system involvement)   OT goals addressed during session: ADL's and self-care       AM-PAC PT "6 Clicks" Mobility  Outcome Measure Help needed turning from your back to your side while in a flat bed without using bedrails?: A Little Help needed moving from lying on your back to sitting on the side of a flat bed without using bedrails?: A Lot Help needed moving to and from a bed to a chair (including a wheelchair)?: Total Help needed standing up from a chair using your arms (e.g., wheelchair or bedside chair)?: Total Help needed to walk in hospital room?: Total Help needed climbing 3-5 steps with a railing? : Total 6 Click Score: 9    End of Session   Activity Tolerance: Patient limited by fatigue;Patient limited by pain Patient left: in bed;with call bell/phone within reach;with bed alarm set;with family/visitor present Nurse Communication: Mobility status PT Visit Diagnosis: Other abnormalities of gait and mobility (R26.89);Muscle weakness (generalized) (M62.81)    Time: 1040-1103 PT Time Calculation (min) (ACUTE ONLY): 23 min   Charges:   PT Evaluation $PT Eval Low Complexity: 1 Low  PT General Charges $$ ACUTE PT VISIT: 1 Visit         Shirlene Doughty, PT DPT Acute Rehabilitation Services Secure Chat Preferred  Office [phone]   Camila Maita E Burnadette Carrion 09/15/2023, 1:10 PM

## 2023-09-15 NOTE — Plan of Care (Signed)
  Problem: Education: Goal: Knowledge of General Education information will improve Description: Including pain rating scale, medication(s)/side effects and non-pharmacologic comfort measures Outcome: Progressing   Problem: Clinical Measurements: Goal: Will remain free from infection Outcome: Progressing   Problem: Clinical Measurements: Goal: Respiratory complications will improve Outcome: Progressing   Problem: Activity: Goal: Risk for activity intolerance will decrease Outcome: Progressing

## 2023-09-16 DIAGNOSIS — R627 Adult failure to thrive: Secondary | ICD-10-CM

## 2023-09-16 DIAGNOSIS — J9601 Acute respiratory failure with hypoxia: Secondary | ICD-10-CM | POA: Diagnosis not present

## 2023-09-16 DIAGNOSIS — Z7189 Other specified counseling: Secondary | ICD-10-CM

## 2023-09-16 DIAGNOSIS — J849 Interstitial pulmonary disease, unspecified: Secondary | ICD-10-CM | POA: Diagnosis not present

## 2023-09-16 DIAGNOSIS — E440 Moderate protein-calorie malnutrition: Secondary | ICD-10-CM | POA: Diagnosis not present

## 2023-09-16 DIAGNOSIS — N3000 Acute cystitis without hematuria: Secondary | ICD-10-CM

## 2023-09-16 DIAGNOSIS — Z515 Encounter for palliative care: Secondary | ICD-10-CM

## 2023-09-16 DIAGNOSIS — R4189 Other symptoms and signs involving cognitive functions and awareness: Secondary | ICD-10-CM

## 2023-09-16 LAB — BASIC METABOLIC PANEL WITH GFR
Anion gap: 12 (ref 5–15)
BUN: 18 mg/dL (ref 8–23)
CO2: 23 mmol/L (ref 22–32)
Calcium: 8.6 mg/dL — ABNORMAL LOW (ref 8.9–10.3)
Chloride: 98 mmol/L (ref 98–111)
Creatinine, Ser: 0.49 mg/dL (ref 0.44–1.00)
GFR, Estimated: >60 mL/min (ref >60)
Glucose, Bld: 114 mg/dL — ABNORMAL HIGH (ref 70–99)
Potassium: 3.6 mmol/L (ref 3.5–5.1)
Sodium: 133 mmol/L — ABNORMAL LOW (ref 135–145)

## 2023-09-16 LAB — MAGNESIUM: Magnesium: 1.8 mg/dL (ref 1.7–2.4)

## 2023-09-16 LAB — CBC
HCT: 42.9 % (ref 36.0–46.0)
Hemoglobin: 13.3 g/dL (ref 12.0–15.0)
MCH: 25.7 pg — ABNORMAL LOW (ref 26.0–34.0)
MCHC: 31 g/dL (ref 30.0–36.0)
MCV: 82.8 fL (ref 80.0–100.0)
Platelets: 360 10*3/uL (ref 150–400)
RBC: 5.18 MIL/uL — ABNORMAL HIGH (ref 3.87–5.11)
RDW: 15.9 % — ABNORMAL HIGH (ref 11.5–15.5)
WBC: 10.5 10*3/uL (ref 4.0–10.5)
nRBC: 0 % (ref 0.0–0.2)

## 2023-09-16 LAB — URINE CULTURE: Culture: >=100000 — AB

## 2023-09-16 MED ORDER — MELATONIN 3 MG PO TABS
3.0000 mg | ORAL_TABLET | Freq: Every day | ORAL | Status: DC
  Administered 2023-09-16: 3 mg via ORAL
  Filled 2023-09-16: qty 1

## 2023-09-16 MED ORDER — ENSURE ENLIVE PO LIQD
237.0000 mL | Freq: Two times a day (BID) | ORAL | Status: DC
  Administered 2023-09-16 – 2023-09-19 (×7): 237 mL via ORAL

## 2023-09-16 NOTE — TOC Initial Note (Signed)
 Transition of Care [facility]) - Initial/Assessment Note    Patient Details  Name: Kayla Wells MRN: [id number] Date of Birth: February 09, 1930  [facility]) CM/SW Contact:    Carmon Christen, LCSWA Phone Number: 09/16/2023, 12:16 PM  Clinical Narrative:                  CSW received consult for possible SNF placement at time of discharge. Due to patients current orientation, CSW spoke with patients son [name] regarding PT recommendation of SNF placement at time of discharge. Patients son [name] reports PTA patient comes from home alone. Patients son expressed understanding of PT recommendation and is agreeable to SNF placement at time of discharge for patient. Patients son [name] gave CSW permission to fax out initial referral for possible SNF placement for patient.CSW discussed insurance authorization process and will provide Medicare SNF ratings list with accepted SNF bed offers when available. No further questions reported at this time. CSW to continue to follow and assist with discharge planning needs.   Expected Discharge Plan: Skilled Nursing Facility Barriers to Discharge: Continued Medical Work up   Patient Goals and CMS Choice     Choice offered to / list presented to : Adult Children (son Tim)      Expected Discharge Plan and Services In-house Referral: Clinical Social Work     Living arrangements for the past 2 months: Single Family Home                                      Prior Living Arrangements/Services Living arrangements for the past 2 months: Single Family Home Lives with:: Self Patient language and need for interpreter reviewed:: Yes        Need for Family Participation in Patient Care: Yes (Comment) Care giver support system in place?: Yes (comment)   Criminal Activity/Legal Involvement Pertinent to Current Situation/Hospitalization: No - Comment as needed  Activities of Daily Living   ADL Screening (condition at time of admission) Independently  performs ADLs?: Yes (appropriate for developmental age) Is the patient deaf or have difficulty hearing?: No Does the patient have difficulty seeing, even when wearing glasses/contacts?: No Does the patient have difficulty concentrating, remembering, or making decisions?: Yes  Permission Sought/Granted Permission sought to share information with : Case Manager, Family Supports, Magazine features editor                Emotional Assessment       Orientation: : Oriented to Self, Oriented to Place Alcohol / Substance Use: Not Applicable Psych Involvement: No (comment)  Admission diagnosis:  Acute hypoxic respiratory failure (HCC) [J96.01] Patient Active Problem List   Diagnosis Date Noted   ILD (interstitial lung disease) (HCC) 09/15/2023   Acute hypoxic respiratory failure (HCC) 09/14/2023   Lower extremity weakness 09/14/2023   Loss of weight 09/14/2023   Decreased appetite 09/14/2023   FTT (failure to thrive) in adult 09/14/2023   Bacteriuria with pyuria 09/14/2023   Urinary frequency 09/14/2023   Urinary incontinence 09/14/2023   Frailty 09/14/2023   Protein-calorie malnutrition (HCC) 09/14/2023   Moderate cognitive impairment 09/14/2023   TIA (transient ischemic attack) 01/02/2022   Cryptogenic stroke (HCC) 02/03/2016   Cough 08/29/2010   GERD 05/14/2009   CYSTITIS, ACUTE 05/05/2009   HLD (hyperlipidemia) 04/14/2009   HYPERTENSION 04/14/2009   PULMONARY FIBROSIS ILD POST INFLAMMATORY CHRONIC 04/14/2009   PCP:  Rosslyn Coons, MD Pharmacy:  [id number] [place], [place] - [address] [place] [address] Phone: [phone] Fax: [phone]  [id number] [place], [place] - [address] AT [facility] OF [address] [place] [address] Phone: [phone] Fax: [phone]     Social Drivers of Health (SDOH) Social History: SDOH  Screenings   Food Insecurity: No Food Insecurity (09/15/2023)  Housing: Low Risk  (09/14/2023)  Transportation Needs: No Transportation Needs (09/14/2023)  Utilities: Not At Risk (09/14/2023)  Social Connections: Moderately Integrated (09/15/2023)  Tobacco Use: Low Risk  (09/14/2023)   SDOH Interventions:     Readmission Risk Interventions     No data to display

## 2023-09-16 NOTE — NC FL2 (Signed)
 [place]  MEDICAID FL2 LEVEL OF CARE FORM     IDENTIFICATION  Patient Name: Kayla Wells Birthdate: 26-Nov-1929 Sex: female Admission Date (Current Location): 09/14/2023  [facility] [place] Number:  Producer, television/film/video and Address:  [facility] [place], [address], [place], [place] [address]      Provider Number: [id number]  Attending Physician Name and Address:  Sandie Cross, MD  Relative Name and Phone Number:  Miriana Gaertner (son) [phone]    Current Level of Care: Hospital Recommended Level of Care: Skilled Nursing Facility Prior Approval Number:    Date Approved/Denied:   PASRR Number: [id number]  Discharge Plan: SNF    Current Diagnoses: Patient Active Problem List   Diagnosis Date Noted   ILD (interstitial lung disease) (HCC) 09/15/2023   Acute hypoxic respiratory failure (HCC) 09/14/2023   Lower extremity weakness 09/14/2023   Loss of weight 09/14/2023   Decreased appetite 09/14/2023   FTT (failure to thrive) in adult 09/14/2023   Bacteriuria with pyuria 09/14/2023   Urinary frequency 09/14/2023   Urinary incontinence 09/14/2023   Frailty 09/14/2023   Protein-calorie malnutrition (HCC) 09/14/2023   Moderate cognitive impairment 09/14/2023   TIA (transient ischemic attack) 01/02/2022   Cryptogenic stroke (HCC) 02/03/2016   Cough 08/29/2010   GERD 05/14/2009   CYSTITIS, ACUTE 05/05/2009   HLD (hyperlipidemia) 04/14/2009   HYPERTENSION 04/14/2009   PULMONARY FIBROSIS ILD POST INFLAMMATORY CHRONIC 04/14/2009    Orientation RESPIRATION BLADDER Height & Weight     Self, Place  Normal Continent Weight: 125 lb (56.7 kg) Height:  5\' 1"  (154.9 cm)  BEHAVIORAL SYMPTOMS/MOOD NEUROLOGICAL BOWEL NUTRITION STATUS      Continent Diet (Please see discharge summary)  AMBULATORY STATUS COMMUNICATION OF NEEDS Skin   Extensive Assist Verbally Other (Comment) (scratch marks,arm,leg,wound/Incision LDAs,Wound/Incision open or dehisced  non-pressure wound,pretibial,proximal,R,small circular scabs,r/t picking #3,non-pressure wound/arm,L,lower,posterior, w/scabs and dry)                       Personal Care Assistance Level of Assistance  Bathing, Feeding, Dressing Bathing Assistance: Maximum assistance Feeding assistance: Independent Dressing Assistance: Maximum assistance     Functional Limitations Info  Sight, Hearing, Speech Sight Info: Adequate Hearing Info: Adequate Speech Info: Adequate    SPECIAL CARE FACTORS FREQUENCY  PT (By licensed PT), OT (By licensed OT)     PT Frequency: 5x min weekly OT Frequency: 5x min weekly            Contractures Contractures Info: Not present    Additional Factors Info  Code Status, Allergies, Psychotropic Code Status Info: DNR Allergies Info: Fish Oil,Macrodantin (nitrofurantoin Macrocrystal) Psychotropic Info: melatonin tablet 3 mg daily at bedtime         Current Medications (09/16/2023):  This is the current hospital active medication list Current Facility-Administered Medications  Medication Dose Route Frequency Provider Last Rate Last Admin   acetaminophen  (TYLENOL ) tablet 650 mg  650 mg Oral Q6H PRN Zheng, Michael, DO   650 mg at 09/15/23 1343   feeding supplement (ENSURE ENLIVE / ENSURE PLUS) liquid 237 mL  237 mL Oral BID BM Sandie Cross, MD   237 mL at 09/16/23 1050   [START ON 09/17/2023] levothyroxine  (SYNTHROID ) tablet 137 mcg  137 mcg Oral Once per day on Monday Tuesday Wednesday Thursday Friday Arellano Zameza, Cynthea Drier, MD       melatonin tablet 3 mg  3 mg Oral QHS Zheng, Michael, DO  rivaroxaban  (XARELTO ) tablet 10 mg  10 mg Oral Daily Bender, Emily, DO       rosuvastatin  (CRESTOR ) tablet 10 mg  10 mg Oral Daily Aurora Lees, DO         Discharge Medications: Please see discharge summary for a list of discharge medications.  Relevant Imaging Results:  Relevant Lab Results:   Additional Information SSN-[id number]  Carmon Christen, LCSWA

## 2023-09-16 NOTE — Plan of Care (Signed)
  Problem: Clinical Measurements: Goal: Ability to maintain clinical measurements within normal limits will improve Outcome: Progressing Goal: Will remain free from infection Outcome: Progressing   Problem: Nutrition: Goal: Adequate nutrition will be maintained Outcome: Progressing   Problem: Pain Managment: Goal: General experience of comfort will improve and/or be controlled Outcome: Progressing   Problem: Safety: Goal: Ability to remain free from injury will improve Outcome: Progressing   Problem: Skin Integrity: Goal: Risk for impaired skin integrity will decrease Outcome: Progressing

## 2023-09-16 NOTE — Progress Notes (Signed)
 Summary: Kayla Wells is a 89 yo with a history of interstitial lung disease, hypothyroidism, paroxysmal atrial fibrillation not on Xarelto , numerous TIAs, hypertension who presented to the emergency department today for evaluation of shortness of breath and weakness and admitted for acute hypoxic respiratory failure.  Subjective:  Patient awake and alert. Denies any new concerns. Difficulty sleeping due to her leg pain bilaterally. Son is at bedside. Denies chest or abdominal pain and no further dysuria.   Objective: Vital signs in last 24 hours: Vitals:   09/15/23 1705 09/15/23 1932 09/16/23 0429 09/16/23 0600  BP: 109/60 110/78 (!) 161/82 (!) 148/91  Pulse: (!) 107 68 99 98  Resp: 16 18 18 18   Temp:  97.6 F (36.4 C) 98 F (36.7 C) 98.5 F (36.9 C)  TempSrc:  Oral Oral Oral  SpO2: 95% 96% 94% 94%  Weight:      Height:          Latest Ref Rng & Units 09/15/2023    4:16 AM 09/14/2023    2:46 PM 06/30/2023    3:59 PM  CBC  WBC 4.0 - 10.5 K/uL 10.2  13.2  14.3   Hemoglobin 12.0 - 15.0 g/dL 40.9  81.1  91.4   Hematocrit 36.0 - 46.0 % 35.9  41.7  42.0   Platelets 150 - 400 K/uL 350  383  388        Latest Ref Rng & Units 09/15/2023    4:16 AM 09/14/2023    2:46 PM 06/30/2023    3:59 PM  BMP  Glucose 70 - 99 mg/dL 88  782  956   BUN 8 - 23 mg/dL 22  28  19    Creatinine 0.44 - 1.00 mg/dL 2.13  0.86  5.78   Sodium 135 - 145 mmol/L 133  133  134   Potassium 3.5 - 5.1 mmol/L 3.6  4.2  3.8   Chloride 98 - 111 mmol/L 98  95  95   CO2 22 - 32 mmol/L 25  23  29    Calcium  8.9 - 10.3 mg/dL 8.5  9.1  9.2      Physical Exam  Constitutional: Patient is resting comfortably in bed in no acute distress.  CV: RRR, no pitting LE edema.  Pulmonary/Respiratory: Normal respiratory effort on room air, lungs clear to auscultation bilaterally  Abdominal: Suprapubic tenderness with palpation has improved, bowel sounds present. Non-distended. MSK: Bilateral tenderness to palpation of both lower  extremities, R > L lower extremity weakness   Skin: Warm and dry  Psych: Normal mood and affect.  Assessment/Plan:  Principal Problem:   Acute hypoxic respiratory failure (HCC) Active Problems:   PULMONARY FIBROSIS ILD POST INFLAMMATORY CHRONIC   Lower extremity weakness   Loss of weight   Decreased appetite   FTT (failure to thrive) in adult   Bacteriuria with pyuria   Urinary frequency   Urinary incontinence   Frailty   Protein-calorie malnutrition (HCC)   Moderate cognitive impairment   ILD (interstitial lung disease) (HCC)  Acute Hypoxic Respiratory Failure, improving ILD Patient on room air this morning saturating around 92-94%. Denies SOB and lung exam stable. Afebrile.Thought was progression of ILD leading to AHRF. Was following with pulmonology and was not on any medications/treatment for ILD, more monitoring. Will continue to monitor.  - Follow up on ambulatory pulse ox  - Supplemental O2 as needed  - PT and OT eval   Deconditioning GOC Frailty  Moderate Cognitive Impairment  Patient has  had ongoing deconditioning, especially in the last few weeks when son reports she has appeared to be weaker and has required more help than usual. Does have an aide that comes in to help her 4 days a week as well as home PT given that patient lives at home on her [place]. Her son lives close by and sees her frequently. Patient has long term care insurance as well per son. During admission patient's son expressed interest in a palliative care consult which was placed at the time. Also discussed PACE and available resources outpatient. Discussed PT/OT recs of SNF with patient and son which both are in agreement to consider it. She has a DNR/DNI. - Palliative care consulted - Consider MOST form  - TOC consult for SNF placement    Lower Extremity Weakness B/l Leg swelling Peripheral Neuropathy Bilateral leg weakness and associated swelling/tenderness stable today compared to admission exam.  US  of lower extremities negative for DVT. BNP WNL. Head CT negative for hemorrhage. B12 noted to be low as well as magnesium. Both repleted. PT/OT to evaluate and see patient while she is here.  - PT and OT eval, recs SNF  - If LE swelling, no hx of PAD, can try TED hose   Vitamin B12 Deficiency Level low at 169. Received IM B12 1000 mcg yesterday.   - Continue B12 IM weekly x 4 weeks then monthly, can also consider change to oral supplementation closer to d/c  Medication induced hyperthyroidism TSH 0.189 (below normal), will adjust levothyroxine  therapy as patient may be experiencing medication induced hyperthyroidism.  Plan - DECREASE synthroid  to 137 mcg in AM M-F - Follow up TSH outpatient in 6-8 weeks    UTI - Klebsiella  Leukocytosis Patient presented with symptoms of UTI including increased urinary frequency. Ucx grew >100k Klebsiella sensitive to CTX which she is currently on. Leukocytosis has resolved. Remains afebrile.  - Complete ceftriaxone  today (day 3/3)    Tooth Extraction Protein-calorie Malnutrition  Decreased Appetite  Patient has poor oral intake that may be related to both tooth extraction and failure to thrive. Plan: - Registered dietician consulted, follow up recommendations - Regular diet per SLP eval - Continue Boost drinks    Afib not on [facility]  Patient is in NSR on exam.  By choice, she is not on anticoagulation at this time.   Diet: Normal IVF: None Abx: CTX VTE: DOAC Code: DNR/DNI Therapy recs: SNF Family Update: son at bedside   Prior to Admission Living Arrangement: Home Anticipated Discharge Location: SNF vs Home  Barriers to Discharge: medical stability and SNF placement  Dispo: Anticipated discharge in approximately 2-3 days pending SNF placement   Jearldine Mina, DO, PGY-2 09/16/2023, 6:42 AM After 5pm on weekdays and 1pm on weekends: On Call pager [phone]

## 2023-09-16 NOTE — Progress Notes (Signed)
 Patient awake the whole night.verbalizes no pain,no nausea and vomiting.Son at bedside.

## 2023-09-16 NOTE — Consult Note (Signed)
 Consultation Note Date: 09/16/2023   Patient Name: Kayla Wells  DOB: 1929-06-03  MRN: [id number]  Age / Sex: 89 y.o., female  PCP: Rosslyn Coons, MD Referring Physician: Sandie Cross, MD  Reason for Consultation: Establishing goals of care  HPI/Patient Profile: 89 y.o. female  with past medical history of interstitial lung disease, hypothyroidism, paroxysmal atrial fibrillation not on Xarelto , numerous TIAs, hypertension  admitted on 09/14/2023 with shortness of breath and weakness.   Patient was admitted with acute hypoxic respiratory failure and UTI.  Concern is for progression of ILD gradually over time.  PMT has been consulted to assist with goals of care conversation.  Clinical Assessment and Goals of Care:  I have reviewed medical records including EPIC notes, labs and imaging, assessed the patient and then at the bedside with patient's son [name] to discuss diagnosis prognosis, GOC, EOL wishes, disposition and options.  I introduced Palliative Medicine as specialized medical care for people living with serious illness. It focuses on providing relief from the symptoms and stress of a serious illness. The goal is to improve quality of life for both the patient and the family.  We discussed a brief life review of the patient and then focused on their current illness.  The natural disease trajectory and expectations at EOL were discussed.  I attempted to elicit values and goals of care important to the patient.    Medical History Review and Understanding:  We discussed patient's acute illness in the context of their chronic comorbidities.  Patient's son understands the severity of patient's illness.   Social History: Patient lives next-door to her son with a caregiver available for days per week.  This has been the arrangement last year or more.  She previously enjoyed doing stretches and using her Kindle until around Christmas time, now mostly  watches game shows.  This was around the time her only grandson and great-grandchildren moved to [place].  She is widowed after losing her husband to cancer.  Family has hospice experience through his care.  Functional and Nutritional State: Patient has not been able to get out of bed since his acute illness began.  She has had decreased appetite for quite some time.  She will usually eat a big breakfast and snack on fruit and boost shakes the rest of the day.  Albumin of 2.45/16 noted.  Palliative Symptoms: Withdrawn  Code Status: Concepts specific to code status, artifical feeding and hydration, and rehospitalization were considered and discussed.   Discussion: Patient's son has noticed about 6 months of progressive decline and understands that additional outcomes will depend on patient's ability to progress with rehabilitation, as well as any other treatment modalities available for her lung disease.  She seems to be amenable to short-term rehab today, though this has caused her quite a bit of pain during this hospital stay.  We discussed her tenuous condition after several weeks of deconditioning and the potential for further decline, acute decompensation, ultimate need for transition to hospice.  Son clearly understands and wishes to prepare for the best case and worst-case scenario.  Goal would be to get her stronger and return home to her previous baseline.  We discussed the SNF referral process and likely frequency of outpatient palliative support.  He is interested in a referral.  I recommended completion of MOST form prior to discharge given goal of SNF placement.   The difference between aggressive medical intervention and comfort care was considered in light of the patient's goals of  care. Hospice and Palliative Care services outpatient were explained and offered.   Discussed the importance of continued conversation with family and the medical providers regarding overall plan of care and  treatment options, ensuring decisions are within the context of the patient's values and GOCs.   Questions and concerns were addressed.  Hard Choices booklet left for review. The family was encouraged to call with questions or concerns.  PMT will continue to support holistically.   SUMMARY OF RECOMMENDATIONS   -Continue DNR/DNI -Continue current care plan -Goal is for improvement and SNF for rehabilitation.  Patient is reasonable and understands she may decline further with need to transition to hospice -[facility] assistance is appreciated with outpatient palliative care follow-up -Psychosocial and emotional support provided -MOST form introduced today - PMT will continue to follow and support   Prognosis:  Unable to determine  Discharge Planning: Skilled Nursing Facility for rehab with Palliative care service follow-up      Primary Diagnoses: Present on Admission:  Acute hypoxic respiratory failure (HCC)  PULMONARY FIBROSIS ILD POST INFLAMMATORY CHRONIC    Physical Exam Vitals and nursing note reviewed.  Constitutional:      General: She is not in acute distress.    Appearance: She is ill-appearing.  Cardiovascular:     Rate and Rhythm: Normal rate.  Pulmonary:     Effort: Pulmonary effort is normal.  Neurological:     Mental Status: She is alert.  Psychiatric:        Mood and Affect: Mood normal.        Behavior: Behavior normal.     Vital Signs: BP (!) 160/84 (BP Location: Left Arm)   Pulse 99   Temp 97.8 F (36.6 C) (Oral)   Resp 17   Ht 5\' 1"  (1.549 m)   Wt 56.7 kg   SpO2 90%   BMI 23.62 kg/m  Pain Scale: 0-10   Pain Score: 0-No pain   SpO2: SpO2: 90 % O2 Device:SpO2: 90 % O2 Flow Rate: .O2 Flow Rate (L/min): 2 L/min   Palliative Assessment/Data: 30%      Jahnavi Muratore Alroy Jericho, PA-C  Palliative Medicine Team Team phone # [phone]  Thank you for allowing the Palliative Medicine Team to assist in the care of this patient. Please utilize  secure chat with additional questions, if there is no response within 30 minutes please call the above phone number.  Palliative Medicine Team providers are available by phone from 7am to 7pm daily and can be reached through the team cell phone.  Should this patient require assistance outside of these hours, please call the patient's attending physician.

## 2023-09-16 NOTE — Progress Notes (Signed)
 Initial Nutrition Assessment  DOCUMENTATION CODES:   Not applicable  INTERVENTION:  Continue regular diet Encourage PO intake Adjust Boost Breeze to Ensure Plus High Protein po BID, each supplement provides 350 kcal and 20 grams of protein. Consider adding bowel regimen as pt has not had a BM this admission  NUTRITION DIAGNOSIS:   Inadequate oral intake related to decreased appetite as evidenced by per patient/family report.  GOAL:   Patient will meet greater than or equal to 90% of their needs  MONITOR:   PO intake, Supplement acceptance, Labs  REASON FOR ASSESSMENT:   Consult Assessment of nutrition requirement/status, Poor PO  ASSESSMENT:  Pt with hx of HTN, HLD, GERD, PAF, CKD3, pulmonary fibrosis, hypothyroidism, and hx of esophageal stricture presented to ED with weakness and SOB worsening over the last week.  RD working remotely   [facility] and spoke with pt's son, [name], who was able to provide a hx. Discussed recent PO intake and nutrition status. States that in general, he feels pt has a pretty good appetite. Her largest meal is typically breakfast and she will also have a smaller meal in the late afternoon with a meat and sides. Boost High Protein at home. Pt does have an aide that comes in 4 days a week for a few hours and prepares her meals for her.   Son does endorse that over the last few weeks, intake has declined a little and pt has reported that she doesn't feel hungry, but that she has continues to drink the boost and has been eating fruit. No dramatic weight loss has been appreciated by son per visual inspection but he is unsure what her weight has been recently.   Noted recent dental issues. Son reports that she had to have a few teeth extracted due to infection but that this has not affected her intake and that she is doing well with the regular diet. They are assisting her with ordering what she wants to eat. Will adjust boost breeze to ensure plus  for a more calorically dense option. Pt drinks vanilla at home, pt open to all options  Admit / Current weight: 56.7 kg    Average Meal Intake: 5/17: 75% intake x 1 recorded meals  Nutritionally Relevant Medications: Scheduled Meds:  Boost Breeze  1 Container Oral TID BM   rivaroxaban   10 mg Oral Daily   rosuvastatin   10 mg Oral Daily   Labs Reviewed: Sodium 133  NUTRITION - FOCUSED PHYSICAL EXAM: Defer to in-person assessment  Diet Order:   Diet Order             Diet regular Room service appropriate? Yes with Assist; Fluid consistency: Thin  Diet effective now                   EDUCATION NEEDS:   Education needs have been addressed  Skin:  Skin Assessment: Reviewed RN Assessment  Last BM:  5/15  Height:   Ht Readings from Last 1 Encounters:  09/14/23 5\' 1"  (1.549 m)    Weight:   Wt Readings from Last 1 Encounters:  09/14/23 56.7 kg    Ideal Body Weight:  47.7 kg  BMI:  Body mass index is 23.62 kg/m.  Estimated Nutritional Needs:  Kcal:  1400-1600 kcal/d Protein:  65-80g/d Fluid:  >/=1.5L/d    Edwena Graham, RD, LDN Registered Dietitian II Please reach out via secure chat

## 2023-09-17 DIAGNOSIS — J849 Interstitial pulmonary disease, unspecified: Secondary | ICD-10-CM | POA: Diagnosis not present

## 2023-09-17 DIAGNOSIS — Z7189 Other specified counseling: Secondary | ICD-10-CM | POA: Diagnosis not present

## 2023-09-17 DIAGNOSIS — R29898 Other symptoms and signs involving the musculoskeletal system: Secondary | ICD-10-CM

## 2023-09-17 DIAGNOSIS — J9601 Acute respiratory failure with hypoxia: Secondary | ICD-10-CM | POA: Diagnosis not present

## 2023-09-17 DIAGNOSIS — N3000 Acute cystitis without hematuria: Secondary | ICD-10-CM | POA: Diagnosis not present

## 2023-09-17 MED ORDER — MAGNESIUM SULFATE 2 GM/50ML IV SOLN
2.0000 g | Freq: Once | INTRAVENOUS | Status: AC
  Administered 2023-09-17: 2 g via INTRAVENOUS
  Filled 2023-09-17: qty 50

## 2023-09-17 MED ORDER — MUSCLE RUB 10-15 % EX CREA
TOPICAL_CREAM | CUTANEOUS | Status: DC | PRN
  Filled 2023-09-17: qty 85

## 2023-09-17 MED ORDER — MELATONIN 3 MG PO TABS
3.0000 mg | ORAL_TABLET | Freq: Every day | ORAL | Status: DC
  Administered 2023-09-17 – 2023-09-18 (×2): 3 mg via ORAL
  Filled 2023-09-17 (×2): qty 1

## 2023-09-17 NOTE — Progress Notes (Addendum)
 Summary: Kayla Wells is a 89 yo with a history of interstitial lung disease, hypothyroidism, paroxysmal atrial fibrillation not on Xarelto , numerous TIAs, hypertension who presented to the emergency department today for evaluation of shortness of breath and weakness and admitted for acute hypoxic respiratory failure.  Subjective:  Patient is evaluated at bedside with son [name] present, reports feeling well this morning. Breathing is fair. Son at bedside reports dry cough. Presently on 2L oxygen via [place], not on home oxygen. Son at bedside reports LLE weakness about 3 weeks ago, by last week Wednesday, she was unable to move RLE.   Objective: Vital signs in last 24 hours: Vitals:   09/16/23 1634 09/16/23 2042 09/17/23 0408 09/17/23 0754  BP: 138/86 (!) 143/84 (!) 141/81 (!) 149/70  Pulse: (!) 102 91 92 97  Resp: 17 17 18 16   Temp: (!) 97.4 F (36.3 C) 97.6 F (36.4 C) 97.7 F (36.5 C) 97.9 F (36.6 C)  TempSrc: Oral Oral Oral Oral  SpO2: 97% 98% 98% 97%  Weight:      Height:          Latest Ref Rng & Units 09/16/2023    8:47 AM 09/15/2023    4:16 AM 09/14/2023    2:46 PM  CBC  WBC 4.0 - 10.5 K/uL 10.5  10.2  13.2   Hemoglobin 12.0 - 15.0 g/dL 29.5  28.4  13.2   Hematocrit 36.0 - 46.0 % 42.9  35.9  41.7   Platelets 150 - 400 K/uL 360  350  383        Latest Ref Rng & Units 09/16/2023    8:47 AM 09/15/2023    4:16 AM 09/14/2023    2:46 PM  BMP  Glucose 70 - 99 mg/dL 440  88  102   BUN 8 - 23 mg/dL 18  22  28    Creatinine 0.44 - 1.00 mg/dL 7.25  3.66  4.40   Sodium 135 - 145 mmol/L 133  133  133   Potassium 3.5 - 5.1 mmol/L 3.6  3.6  4.2   Chloride 98 - 111 mmol/L 98  98  95   CO2 22 - 32 mmol/L 23  25  23    Calcium  8.9 - 10.3 mg/dL 8.6  8.5  9.1      Physical Exam  Older woman, laying in bed comfortably on 2 L nasal cannula, son at bedside Bilateral fine crackles, normal respiratory rate and effort, no distress Heart rate regular, appears euvolemic Diffuse leg pain to  palpation in the right and left lower extremities, unable to bend the right knee actively for an unclear reasons, but able to bend the R knee passively, no tenderness to palpation in the popliteal fossa of the right knee to correlate with US  findings, no focal sensory loss, upper extremity strength intact  Assessment/Plan: Principal Problem:   Acute hypoxic respiratory failure (HCC) Active Problems:   PULMONARY FIBROSIS ILD POST INFLAMMATORY CHRONIC   Lower extremity weakness   Loss of weight   Decreased appetite   FTT (failure to thrive) in adult   Bacteriuria with pyuria   Urinary frequency   Urinary incontinence   Frailty   Protein-calorie malnutrition (HCC)   Moderate cognitive impairment   ILD (interstitial lung disease) (HCC)  Acute Hypoxic Respiratory Failure, improving ILD Still on 2 L nasal cannula, however seems to be breathing comfortably, will plan to wean O2 today.  Has a dry cough, but no concerns for infection.  Suspect  AHRF may be progression of interstitial lung disease. No acute concerns.  - PT to work with her today - Wean O2 as tolerated; baseline is room air - Outpatient pulm f/u  Deconditioning Moderate Cognitive Impairment  She has had general deconditioning over some time, however this has precipitously worsened in the past few weeks.  Son notes that her legs especially appear to be weaker and more painful than they were previously, for unclear reasons.  She has a home health aide and home health PT.  Plan is for SNF placement once she returns to room air. - PT today - Palliative care following - SNF placement pending   Lower Extremity Weakness and Pain DVT studies negative for thrombosis. Cystic structures in R popliteal fossa, non-tender on exam, may be baker's cyst. Exam today showed diffuse lower extremity pain in her bilateral extremities, with apparent weakness in the right greater than the left. No redness or excessive warmth. Perfusing well. No LE  swelling appreciated. Unclear etiology of pain, perhaps neuropathic vs muscular pain.  - PT today - Muscle rub cream  Vitamin B12 Deficiency Level low at 169. Received IM B12 1000 mcg.   - Oral B12 supplementation at discharge  Medication induced hyperthyroidism TSH 0.189 on admission. Synthroid  decreased. Outpatient follow up.  Plan - Continue synthroid  137 mcg daily - Follow up TSH outpatient in 6-8 weeks    Urinary tract infection, Klebsiella S/p 3 day course of ceftriaxone . Presumed to be treated.    Protein-calorie Malnutrition  Patient has poor oral intake that may be related to both tooth extraction and failure to thrive. Dietician consulted for recommendations.  Plan: - Continue regular diet with supplements per dietician   Diet: Normal IVF: None Abx: None VTE: DOAC Code: DNR/DNI Therapy recs: SNF Family Update: son at bedside   Prior to Admission Living Arrangement: Home Anticipated Discharge Location: SNF Barriers to Discharge: medical stability and SNF placement  Dispo: Anticipated discharge in approximately 2-3 days pending SNF placement   Dorthy Gavia, MD, PGY-1 09/17/2023, 11:06 AM After 5pm on weekdays and 1pm on weekends: On Call pager [phone]

## 2023-09-17 NOTE — TOC Progression Note (Signed)
 Transition of Care [facility]) - Progression Note    Patient Details  Name: Kayla Wells MRN: [id number] Date of Birth: 1929-11-05  [facility] CM/SW Contact  Katrinka Parr, [place] Phone Number: 09/17/2023, 1:35 PM  Clinical Narrative:     CSW called pt's son and spoke with him and his wife regarding SNF bed offers. They are planning to visit [facility] later today and will confirm with CSW if that is their SNF choice.   Shortly after phone call CSW received text message from son confirming choice of [name].   CSW confirmed SNF bed with [facility] Auth request initiated in online portal. Ref# [id number] [facility] is pending  Expected Discharge Plan: Skilled Nursing Facility Barriers to Discharge: Insurance auth  Expected Discharge Plan and Services In-house Referral: Clinical Social Work     Living arrangements for the past 2 months: Single Family Home                                       Social Determinants of Health (SDOH) Interventions SDOH Screenings   Food Insecurity: No Food Insecurity (09/15/2023)  Housing: Low Risk  (09/14/2023)  Transportation Needs: No Transportation Needs (09/14/2023)  Utilities: Not At Risk (09/14/2023)  Social Connections: Moderately Integrated (09/15/2023)  Tobacco Use: Low Risk  (09/14/2023)    Readmission Risk Interventions     No data to display

## 2023-09-17 NOTE — Plan of Care (Signed)

## 2023-09-17 NOTE — Progress Notes (Signed)
 Physical Therapy Treatment Patient Details Name: Kayla Wells MRN: [id number] DOB: [birth year] Today's Date: 09/17/2023   History of Present Illness Pt is a 89 y.o. female presented to [facility] 5/16 for SOB. C/o of bil LE weakness over past week.   CT head CT negative, chest CT showed low lung volume, airway thickening, concerns for fibrosis. PMH: OA, hypothyroidism, pulmonary fibrosis, TIA, a-fib    PT Comments  Pt more agreeable to PT today, though once moving pt becomes intermittently resistant. Pt requiring mod-max assist for transition to EOB then OOB to recliner, pt benefits from squat pivot with face-to-face assist from PT vs use of RW. Pt demonstrating tachypnea with anxiety about mobility, SPO2 min on RA 87-89% so placed back on 2LO2. PT encouraged proper breathing technique to recover. Pt and family on board for d/c to short term rehab post-acutely.     If plan is discharge home, recommend the following: Two people to help with walking and/or transfers;Two people to help with bathing/dressing/bathroom   Can travel by private vehicle        Equipment Recommendations  None recommended by PT    Recommendations for Other Services       Precautions / Restrictions Precautions Precautions: Fall Recall of Precautions/Restrictions: Impaired Restrictions Weight Bearing Restrictions Per Provider Order: No     Mobility  Bed Mobility Overal bed mobility: Needs Assistance Bed Mobility: Supine to Sit     Supine to sit: Mod assist     General bed mobility comments: assist for LE progression to EOB, trunk elevation, and scooting anteriorly towards EOB. pt initiating and using UEs to pull trunk upright.    Transfers Overall transfer level: Needs assistance Equipment used: Rolling walker (2 wheels), 1 person hand held assist Transfers: Sit to/from Stand, Bed to chair/wheelchair/BSC Sit to Stand: Max assist     Squat pivot transfers: Max assist     General transfer comment:  initially attempted sit>stand with RW, pt unable to clear buttocks even with max assist. Transitioned to squat pivot with assist for power up, steadying, and safe pivot to recliner. Pt more willing to help with face-to-face pivot vs with RW.    Ambulation/Gait                   Stairs             Wheelchair Mobility     Tilt Bed    Modified Rankin (Stroke Patients Only)       Balance Overall balance assessment: Needs assistance Sitting-balance support: No upper extremity supported, Feet supported Sitting balance-Leahy Scale: Fair Sitting balance - Comments: can sit EOB unsupported once EOB   Standing balance support: Bilateral upper extremity supported, During functional activity Standing balance-Leahy Scale: Zero Standing balance comment: max assist                            Communication Communication Communication: Impaired Factors Affecting Communication: Hearing impaired  Cognition Arousal: Alert Behavior During Therapy: Flat affect   PT - Cognitive impairments: Safety/Judgement, Problem solving, Sequencing                         Following commands: Impaired Following commands impaired: Follows one step commands with increased time, Follows one step commands inconsistently    Cueing Cueing Techniques: Verbal cues, Gestural cues  Exercises General Exercises - Lower Extremity Long Arc Quad: AAROM, Both, 10 reps, Seated  General Comments        Pertinent Vitals/Pain Pain Assessment Pain Assessment: Faces Faces Pain Scale: Hurts little more Pain Location: BLEs, L>R Pain Descriptors / Indicators: Grimacing, Guarding, Discomfort, Moaning Pain Intervention(s): Limited activity within patient's tolerance, Monitored during session, Repositioned    Home Living                          Prior Function            PT Goals (current goals can now be found in the care plan section) Acute Rehab PT Goals PT Goal  Formulation: With patient Time For Goal Achievement: 09/29/23 Potential to Achieve Goals: Good Progress towards PT goals: Progressing toward goals    Frequency    Min 2X/week      PT Plan      Co-evaluation              AM-PAC PT "6 Clicks" Mobility   Outcome Measure  Help needed turning from your back to your side while in a flat bed without using bedrails?: A Lot Help needed moving from lying on your back to sitting on the side of a flat bed without using bedrails?: A Lot Help needed moving to and from a bed to a chair (including a wheelchair)?: Total Help needed standing up from a chair using your arms (e.g., wheelchair or bedside chair)?: Total Help needed to walk in hospital room?: Total Help needed climbing 3-5 steps with a railing? : Total 6 Click Score: 8    End of Session Equipment Utilized During Treatment: Gait belt;Oxygen (O2 off upon arrival to room, SpO2 drop 87-89% on RA so placed back on 2LO2) Activity Tolerance: Patient limited by fatigue;Patient limited by pain Patient left: in bed;with call bell/phone within reach;with bed alarm set;with family/visitor present Nurse Communication: Mobility status PT Visit Diagnosis: Other abnormalities of gait and mobility (R26.89);Muscle weakness (generalized) (M62.81)     Time: 1610-9604 PT Time Calculation (min) (ACUTE ONLY): 22 min  Charges:    $Therapeutic Activity: 8-22 mins PT General Charges $$ ACUTE PT VISIT: 1 Visit                     Shirlene Doughty, PT DPT Acute Rehabilitation Services Secure Chat Preferred  Office [phone]    Nariah Morgano Cydney Draft 09/17/2023, 12:23 PM

## 2023-09-17 NOTE — Progress Notes (Signed)
 Daily Progress Note   Patient Name: Kayla Wells       Date: 09/17/2023 DOB: 1930-03-02  Age: 89 y.o. MRN#: [id number] Attending Physician: Bevelyn Bryant, MD Primary Care Physician: Rosslyn Coons, MD Admit Date: 09/14/2023  Reason for Consultation/Follow-up: Establishing goals of care  Subjective: Medical records reviewed including progress notes, labs, and imaging. Patient assessed at the bedside. She is eating her breakfast, denies pain dyspnea or other concerns. Her son is present visiting.  Created space and opportunity for patient and family's thoughts and feelings on patient's current illness. They have not reviewed MOST form in much detail and would like to continue discussions in the outpatient setting.   Son notes patient's difficulty resting overnight and I changed melatonin order to be given at 8pm. Education on the medication and non-addictive nature was provided. Counseled on the SNF referral process and suggested visiting facilities in person to possibly ease the stress of making a good decision. No other needs at this time.  Questions and concerns addressed. PMT will continue to support holistically.   Length of Stay: 0   Physical Exam Vitals and nursing note reviewed.  Constitutional:      General: She is not in acute distress. Pulmonary:     Effort: Pulmonary effort is normal.     Comments: 2L [place] Skin:    General: Skin is warm and dry.  Neurological:     Mental Status: She is alert.     Motor: Weakness present.  Psychiatric:        Behavior: Behavior normal.             Vital Signs: BP (!) 149/70   Pulse 97   Temp 97.9 F (36.6 C) (Oral)   Resp 16   Ht 5\' 1"  (1.549 m)   Wt 56.7 kg   SpO2 97%   BMI 23.62 kg/m  SpO2: SpO2: 97 % O2 Device: O2 Device: Nasal Cannula O2 Flow Rate: O2 Flow Rate (L/min): 2  L/min      Palliative Assessment/Data: 30%   Palliative Care Assessment & Plan   Patient Profile: 89 y.o. female  with past medical history of interstitial lung disease, hypothyroidism, paroxysmal atrial fibrillation not on Xarelto , numerous TIAs, hypertension  admitted on 09/14/2023 with shortness of breath and weakness.    Patient was admitted with acute hypoxic respiratory failure and UTI.  Concern is for progression of ILD gradually over time.   PMT has been consulted to assist with goals of care conversation.  Assessment: Goals of care conversation UTI Debility and deconditioning Acute hypoxic respiratory failure, improving ILD  Recommendations/Plan: Continue  DNR/DNI Continue current care plan Goal is for improvement and SNF for rehabilitation Patient's son is reasonable and understands she may decline further with need to transition to hospice  MOST form completion deferred today TOC previously consulted for assistance with outpatient palliative care referral PMT remains available as needed   Prognosis:  High risk for decline given chronic comorbidities, age, frailty   Discharge Planning: Skilled Nursing Facility for rehab with Palliative care service follow-up  Care plan was discussed with patient, patient's son    Time Total: 35 minutes  Visit consisted of counseling and education dealing with the complex and emotionally intense issues of symptom management and palliative care in the setting of serious and potentially life-threatening illness. Greater than 50% of this time was spent counseling and coordinating care related to the above assessment and plan.  Personally spent 35 minutes in patient care including extensive chart review (labs, imaging, progress/consult notes, vital signs), medically appropraite exam, discussed with treatment team, education to patient, family, and staff, documenting clinical information, medication review and management, coordination  of care, and available advanced directive documents.    Nika Yazzie P Cambria Osten, PA-C  Palliative Medicine Team Team phone # [phone]  Thank you for allowing the Palliative Medicine Team to assist in the care of this patient. Please utilize secure chat with additional questions, if there is no response within 30 minutes please call the above phone number.  Palliative Medicine Team providers are available by phone from 7am to 7pm daily and can be reached through the team cell phone.  Should this patient require assistance outside of these hours, please call the patient's attending physician.

## 2023-09-18 DIAGNOSIS — R54 Age-related physical debility: Secondary | ICD-10-CM | POA: Diagnosis not present

## 2023-09-18 DIAGNOSIS — E43 Unspecified severe protein-calorie malnutrition: Secondary | ICD-10-CM | POA: Insufficient documentation

## 2023-09-18 DIAGNOSIS — J849 Interstitial pulmonary disease, unspecified: Secondary | ICD-10-CM | POA: Diagnosis not present

## 2023-09-18 DIAGNOSIS — R4189 Other symptoms and signs involving cognitive functions and awareness: Secondary | ICD-10-CM | POA: Diagnosis not present

## 2023-09-18 DIAGNOSIS — J9601 Acute respiratory failure with hypoxia: Secondary | ICD-10-CM | POA: Diagnosis not present

## 2023-09-18 LAB — CBC
HCT: 38.3 % (ref 36.0–46.0)
Hemoglobin: 12.1 g/dL (ref 12.0–15.0)
MCH: 26.8 pg (ref 26.0–34.0)
MCHC: 31.6 g/dL (ref 30.0–36.0)
MCV: 84.7 fL (ref 80.0–100.0)
Platelets: 353 10*3/uL (ref 150–400)
RBC: 4.52 MIL/uL (ref 3.87–5.11)
RDW: 15.9 % — ABNORMAL HIGH (ref 11.5–15.5)
WBC: 9.3 10*3/uL (ref 4.0–10.5)
nRBC: 0 % (ref 0.0–0.2)

## 2023-09-18 LAB — MAGNESIUM: Magnesium: 1.9 mg/dL (ref 1.7–2.4)

## 2023-09-18 MED ORDER — HYDROXYZINE HCL 10 MG PO TABS
10.0000 mg | ORAL_TABLET | Freq: Three times a day (TID) | ORAL | Status: DC | PRN
  Administered 2023-09-18 (×2): 10 mg via ORAL
  Filled 2023-09-18 (×2): qty 1

## 2023-09-18 MED ORDER — VITAMIN B-12 100 MCG PO TABS: 500.0000 ug | ORAL_TABLET | Freq: Every day | ORAL | Status: DC

## 2023-09-18 MED ORDER — ADULT MULTIVITAMIN W/MINERALS CH
1.0000 | ORAL_TABLET | Freq: Every day | ORAL | Status: DC
  Administered 2023-09-18 – 2023-09-19 (×2): 1 via ORAL
  Filled 2023-09-18 (×2): qty 1

## 2023-09-18 MED ORDER — VITAMIN B-12 1000 MCG PO TABS
1000.0000 ug | ORAL_TABLET | Freq: Every day | ORAL | Status: DC
  Administered 2023-09-18 – 2023-09-19 (×2): 1000 ug via ORAL
  Filled 2023-09-18 (×2): qty 1

## 2023-09-18 NOTE — Plan of Care (Signed)

## 2023-09-18 NOTE — TOC Progression Note (Signed)
 Transition of Care [facility]) - Progression Note    Patient Details  Name: Kayla Wells MRN: [id number] Date of Birth: Feb 09, 1930  [facility]) CM/SW Contact  Katrinka Parr, [place] Phone Number: 09/18/2023, 9:50 AM  Clinical Narrative:     SNF [facility] is still pending at this time.   Expected Discharge Plan: Skilled Nursing Facility Barriers to Discharge: Continued Medical Work up  Expected Discharge Plan and Services In-house Referral: Clinical Social Work     Living arrangements for the past 2 months: Single Family Home                                       Social Determinants of Health (SDOH) Interventions SDOH Screenings   Food Insecurity: No Food Insecurity (09/15/2023)  Housing: Low Risk  (09/14/2023)  Transportation Needs: No Transportation Needs (09/14/2023)  Utilities: Not At Risk (09/14/2023)  Social Connections: Moderately Integrated (09/15/2023)  Tobacco Use: Low Risk  (09/14/2023)    Readmission Risk Interventions     No data to display

## 2023-09-18 NOTE — Progress Notes (Addendum)
 Summary: Kayla Wells is a 89 yo with a history of interstitial lung disease, hypothyroidism, paroxysmal atrial fibrillation not on Xarelto , numerous TIAs, hypertension who presented to the emergency department today for evaluation of shortness of breath and weakness and admitted for acute hypoxic respiratory failure.  Subjective:  Patient is evaluated at bedside with son [name] present.  She slept well last night.  States she feels well, denies any shortness of breath.  Son has chosen [facility] for her for SNF placement.  Objective: Vital signs in last 24 hours: Vitals:   09/17/23 1740 09/17/23 2045 09/18/23 0356 09/18/23 0743  BP: (!) 117/35 121/68 126/76 139/73  Pulse: 87 71 64 76  Resp: 17 17 17 17   Temp: 98.3 F (36.8 C) 97.7 F (36.5 C) 97.6 F (36.4 C) 97.7 F (36.5 C)  TempSrc: Oral Oral Oral   SpO2: 96% 100% 99% 95%  Weight:      Height:          Latest Ref Rng & Units 09/18/2023    8:21 AM 09/16/2023    8:47 AM 09/15/2023    4:16 AM  CBC  WBC 4.0 - 10.5 K/uL 9.3  10.5  10.2   Hemoglobin 12.0 - 15.0 g/dL 16.1  09.6  04.5   Hematocrit 36.0 - 46.0 % 38.3  42.9  35.9   Platelets 150 - 400 K/uL 353  360  350        Latest Ref Rng & Units 09/16/2023    8:47 AM 09/15/2023    4:16 AM 09/14/2023    2:46 PM  BMP  Glucose 70 - 99 mg/dL 409  88  811   BUN 8 - 23 mg/dL 18  22  28    Creatinine 0.44 - 1.00 mg/dL 9.14  7.82  9.56   Sodium 135 - 145 mmol/L 133  133  133   Potassium 3.5 - 5.1 mmol/L 3.6  3.6  4.2   Chloride 98 - 111 mmol/L 98  98  95   CO2 22 - 32 mmol/L 23  25  23    Calcium  8.9 - 10.3 mg/dL 8.6  8.5  9.1      Physical Exam  Older woman, laying in bed comfortably on room air, satting 88-92% refusing [place]inson, son at bedside Bilateral fine crackles, normal respiratory rate and effort, no distress Heart rate regular, no murmur appreciated, appears euvolemic No acute neurological changes  Assessment/Plan: Principal Problem:   Acute hypoxic respiratory failure  (HCC) Active Problems:   PULMONARY FIBROSIS ILD POST INFLAMMATORY CHRONIC   Lower extremity weakness   Loss of weight   Decreased appetite   FTT (failure to thrive) in adult   Bacteriuria with pyuria   Urinary frequency   Urinary incontinence   Frailty   Protein-calorie malnutrition (HCC)   Moderate cognitive impairment   ILD (interstitial lung disease) (HCC)  Acute Hypoxic Respiratory Failure, improving ILD Currently on room air, refusing nasal cannula at rest.  Satting 87-92% on room air.  Denies any subjective shortness of breath.  Suspect she may need to go home with oxygen, especially to use during activity.  Son says she gets anxious at baseline prior to moving, which contributes to her dyspnea.  We will have her ambulate again today with oxygen and monitor her O2.  - Ambulation with pulse ox - Wean O2 as tolerated; baseline is room air; O2 goal >90% - Outpatient pulm f/u  Deconditioning Moderate Cognitive Impairment  No acute changes today.  She has a home health aide and home health PT.  Plan is for SNF placement pending insurance authorization.  - Continue PT - Palliative care following - SNF placement pending insurance auth   Lower Extremity Weakness and Pain Unclear etiology of weakness/pain, perhaps neuropathic vs muscular pain in the setting of general decline. No focality elicited on exam. DVT studies negative. Will continue with PT and treat symptomatically.  - Continue PT - Muscle rub cream  Vitamin B12 Deficiency Level low at 169. Received IM B12 1000 mcg.   - Oral B12 supplementation, 500 mcg daily  Medication induced hyperthyroidism TSH 0.189 on admission. Synthroid  decreased. Outpatient follow up.  - Continue synthroid  137 mcg daily - Follow up TSH outpatient in 6-8 weeks   Protein-calorie Malnutrition  Patient has poor oral intake that may be related to both tooth extraction and failure to thrive. Dietician consulted for recommendations.  Plan: -  Continue regular diet with supplements per dietician   Diet: Normal IVF: None Abx: None VTE: DOAC Code: DNR/DNI Therapy recs: SNF Family Update: son at bedside   Prior to Admission Living Arrangement: Home Anticipated Discharge Location: SNF Barriers to Discharge: SNF placement  Dispo: Anticipated discharge in approximately 2-3 days pending SNF insurance authorization   Dorthy Gavia, MD, PGY-1 09/18/2023, 10:47 AM After 5pm on weekdays and 1pm on weekends: On Call pager [phone]

## 2023-09-18 NOTE — Progress Notes (Signed)
   09/18/23 1200  Level of Consciousness  Level of Consciousness Alert  MEWS COLOR  MEWS Score Color Green  Oxygen Therapy  SpO2 (!) 84 %  O2 Device Nasal Cannula  O2 Flow Rate (L/min) 2 L/min  Patient Activity (if Appropriate) Ambulating  MEWS Score  MEWS Temp 0  MEWS Systolic 0  MEWS Pulse 0  MEWS RR 0  MEWS LOC 0  MEWS Score 0

## 2023-09-18 NOTE — Progress Notes (Signed)
 Nutrition Follow-up  DOCUMENTATION CODES:   Severe malnutrition in context of chronic illness  INTERVENTION:   Continue regular diet Encourage PO intake Continue Ensure Plus High Protein po BID, each supplement provides 350 kcal and 20 grams of protein. Magic cup TID with meals, each supplement provides 290 kcal and 9 grams of protein MVI with minerals daily  High calorie, high protein handout in AVS  NUTRITION DIAGNOSIS:   Severe Malnutrition related to chronic illness as evidenced by severe fat depletion, severe muscle depletion.  - New  GOAL:   Patient will meet greater than or equal to 90% of their needs  - Progressing   MONITOR:   PO intake, Supplement acceptance, Labs  REASON FOR ASSESSMENT:   Consult Assessment of nutrition requirement/status, Poor PO  ASSESSMENT:   Pt with hx of HTN, HLD, GERD, PAF, CKD3, pulmonary fibrosis, hypothyroidism, and hx of esophageal stricture presented to ED with weakness and SOB worsening over the last week.   Pt resting in bed, family at bedside. Has been eating pretty good since admission. Usually has a bigger breakfast and smaller meals or snacks throughout the day. Had 75% of eggs, bacon, toast, grits, and yogurt this morning. Drinking 1-2 Ensures per day. Family does report she has had lower extremity weakness in the past couple of weeks. Edema noted in lower extremities as well.   Willing to try Magic cups with meals. Plan for SNF. Discussed high calorie, high proteins options with family and placed handouts in AVS. Vitmin B-12 low, MD supplementing.   Admit weight: 56.7 kg Current weight: 56.7 kg   Average Meal Intake: 5/17: 75 % x 1 meal  5/18 : 50% x 3 meals  5/19: 100% x 1 meal  Nutritionally Relevant Medications: Scheduled Meds:  vitamin B-12  1,000 mcg Oral Daily   feeding supplement  237 mL Oral BID BM   levothyroxine   137 mcg Oral Once per day on Monday Tuesday Wednesday Thursday Friday   melatonin  3 mg Oral  Q2000   rivaroxaban   10 mg Oral Daily   rosuvastatin   10 mg Oral Daily   Labs Reviewed: Sodium 133 Calcium  8.6 Albumin 2.4 Vitamin B12 169 No recent CBG No A1C  NUTRITION - FOCUSED PHYSICAL EXAM:  Flowsheet Row Most Recent Value  Orbital Region Severe depletion  Upper Arm Region Moderate depletion  Thoracic and Lumbar Region Moderate depletion  Buccal Region Severe depletion  Temple Region Severe depletion  Clavicle Bone Region Severe depletion  Clavicle and Acromion Bone Region Severe depletion  Scapular Bone Region Severe depletion  Dorsal Hand Severe depletion  Patellar Region Unable to assess  [Fluid]  Anterior Thigh Region Unable to assess  [Fluid]  Posterior Calf Region Unable to assess  [Fluid]  Edema (RD Assessment) Moderate  Hair Reviewed  Eyes Reviewed  Mouth Reviewed  Skin Reviewed  Nails Reviewed       Diet Order:   Diet Order             Diet regular Room service appropriate? Yes with Assist; Fluid consistency: Thin  Diet effective now                   EDUCATION NEEDS:   Education needs have been addressed  Skin:  Skin Assessment: Skin Integrity Issues: Skin Integrity Issues:: Other (Comment) Other: Non pressure wounds: Right pretibial, Left arm   Last BM:  5/15  Height:   Ht Readings from Last 1 Encounters:  09/14/23 5\' 1"  (1.549 m)  Weight:   Wt Readings from Last 1 Encounters:  09/14/23 56.7 kg    Ideal Body Weight:  47.7 kg  BMI:  Body mass index is 23.62 kg/m.  Estimated Nutritional Needs:   Kcal:  1400-1600 kcal/d  Protein:  65-80g/d  Fluid:  >/=1.5L/d   Frederik Jansky, RD Registered Dietitian  See Amion for more information

## 2023-09-18 NOTE — Progress Notes (Signed)
 Mobility Specialist Progress Note:    09/18/23 1220  Mobility  Activity Transferred from bed to chair  Level of Assistance Moderate assist, patient does 50-74% (+2)  Assistive Device Other (Comment) (HHA)  Distance Ambulated (ft) 3 ft  Activity Response Tolerated well  Mobility Referral Yes  Mobility visit 1 Mobility  Mobility Specialist Start Time (ACUTE ONLY) 1156  Mobility Specialist Stop Time (ACUTE ONLY) 1215  Mobility Specialist Time Calculation (min) (ACUTE ONLY) 19 min   Pt received in bed very hesitant to mobility w/ max encouragement pt agreeable. Pt required ModA +2 throughout session. Attempted to ambulate w/ O2 but d/t desat to 83% and hyperventilation d/t anxiety O2 flow increased to 2L/min to keep SPO2 above 88%. Left in chair w/ call bell and personal belongings in reach. All needs met. Chair alarm on. Family in room. RN aware.   Inetta Manes Mobility Specialist  Please contact vis Secure Chat or  Rehab Office [phone]

## 2023-09-18 NOTE — Progress Notes (Signed)
 Mobility Specialist Progress Note:  Nurse requested Mobility Specialist to perform oxygen saturation test with pt which includes removing pt from oxygen both at rest and while ambulating.  Below are the results from that testing.     Patient Saturations on Room Air at Rest = spO2 96%  Patient Saturations on Room Air while Ambulating = sp02 83% .  Rested and performed pursed lip breathing for 1 minute with sp02 at 86%.  Patient Saturations on 2 Liters of oxygen while Ambulating = sp02 93%  At end of testing pt left in room on 2  Liters of oxygen.  Reported results to nurse.    Inetta Manes Mobility Specialist  Please contact vis Secure Chat or  Rehab Office [phone]

## 2023-09-19 ENCOUNTER — Observation Stay (HOSPITAL_COMMUNITY)

## 2023-09-19 DIAGNOSIS — F419 Anxiety disorder, unspecified: Secondary | ICD-10-CM | POA: Diagnosis not present

## 2023-09-19 DIAGNOSIS — R4189 Other symptoms and signs involving cognitive functions and awareness: Secondary | ICD-10-CM | POA: Diagnosis not present

## 2023-09-19 DIAGNOSIS — J9601 Acute respiratory failure with hypoxia: Secondary | ICD-10-CM | POA: Diagnosis not present

## 2023-09-19 DIAGNOSIS — J849 Interstitial pulmonary disease, unspecified: Secondary | ICD-10-CM | POA: Diagnosis not present

## 2023-09-19 LAB — CULTURE, BLOOD (ROUTINE X 2)
Culture: NO GROWTH
Culture: NO GROWTH

## 2023-09-19 MED ORDER — CYANOCOBALAMIN 1000 MCG PO TABS: 1000.0000 ug | ORAL_TABLET | Freq: Every day | ORAL | Status: AC

## 2023-09-19 MED ORDER — ACETAMINOPHEN 500 MG PO TABS
500.0000 mg | ORAL_TABLET | Freq: Four times a day (QID) | ORAL | Status: DC
  Administered 2023-09-19 (×2): 500 mg via ORAL
  Filled 2023-09-19 (×2): qty 1

## 2023-09-19 MED ORDER — BUSPIRONE HCL 5 MG PO TABS: 5.0000 mg | ORAL_TABLET | Freq: Two times a day (BID) | ORAL | Status: AC

## 2023-09-19 MED ORDER — BUSPIRONE HCL 5 MG PO TABS
5.0000 mg | ORAL_TABLET | Freq: Two times a day (BID) | ORAL | Status: DC
  Administered 2023-09-19: 5 mg via ORAL
  Filled 2023-09-19: qty 1

## 2023-09-19 MED ORDER — LEVOTHYROXINE SODIUM 137 MCG PO TABS: 137.0000 ug | ORAL_TABLET | Freq: Every day | ORAL | Status: AC

## 2023-09-19 NOTE — Progress Notes (Signed)
 Pt did not receive scheduled xarelto  because her son says she doesn't take it. Notified care team. Dr. Dorthy Gavia responded, " That's fine, thanks. We are planning for d/c to rehab today once x-rays are back."

## 2023-09-19 NOTE — TOC Progression Note (Addendum)
 Transition of Care [facility]) - Progression Note    Patient Details  Name: Kayla Wells MRN: [id number] Date of Birth: 07-05-1929  [facility]) CM/SW Contact  Katrinka Parr, [place] Phone Number: 09/19/2023, 9:15 AM  Clinical Narrative:      SNF [facility] is still pending at this time.   1035: SNF auth approve 5/21- 5/23. Auth#  [id number]  Pt can admit to [facility]. Notified provider.   Expected Discharge Plan: Skilled Nursing Facility Barriers to Discharge: Continued Medical Work up  Expected Discharge Plan and Services In-house Referral: Clinical Social Work     Living arrangements for the past 2 months: Single Family Home                                       Social Determinants of Health (SDOH) Interventions SDOH Screenings   Food Insecurity: No Food Insecurity (09/15/2023)  Housing: Low Risk  (09/14/2023)  Transportation Needs: No Transportation Needs (09/14/2023)  Utilities: Not At Risk (09/14/2023)  Social Connections: Moderately Integrated (09/15/2023)  Tobacco Use: Low Risk  (09/14/2023)    Readmission Risk Interventions     No data to display

## 2023-09-19 NOTE — Plan of Care (Signed)

## 2023-09-19 NOTE — Progress Notes (Signed)
 Called at [phone] [facility] gave report to receiving nurse, Arlys Lamer, LPN.Also, gave nurse my direct contact number.

## 2023-09-19 NOTE — TOC Transition Note (Signed)
 Transition of Care [facility]) - Discharge Note   Patient Details  Name: Kayla Wells MRN: [id number] Date of Birth: 1929/07/30  Transition of Care [facility]) CM/SW Contact:  Katrinka Parr, LCSW Phone Number: 09/19/2023, 3:07 PM   Clinical Narrative:       Per MD patient ready for DC to [facility]. RN, patient, patient's family, and facility notified of DC. Discharge Summary and FL2 sent to facility. RN to call report prior to discharge [phone]). DC packet on chart. Ambulance transport requested for patient.   CSW will sign off for now as social work intervention is no longer needed. Please consult us  again if new needs arise.   Final next level of care: Skilled Nursing Facility Barriers to Discharge: No Barriers Identified   Patient Goals and CMS Choice     Choice offered to / list presented to : Adult Children (son [name])      Discharge Placement              Patient chooses bed at: [facility] Patient to be transferred to facility by: [facility] Name of family member notified: Son [name] Patient and family notified of of transfer: 09/19/23  Discharge Plan and Services Additional resources added to the After Visit Summary for   In-house Referral: Clinical Social Work                                   Social Drivers of Health (SDOH) Interventions SDOH Screenings   Food Insecurity: No Food Insecurity (09/15/2023)  Housing: Low Risk  (09/14/2023)  Transportation Needs: No Transportation Needs (09/14/2023)  Utilities: Not At Risk (09/14/2023)  Social Connections: Moderately Integrated (09/15/2023)  Tobacco Use: Low Risk  (09/14/2023)     Readmission Risk Interventions     No data to display

## 2023-09-19 NOTE — Discharge Summary (Addendum)
 Name: Kayla Wells MRN: [id number] DOB: 07-Feb-1930 89 y.o. PCP: Rosslyn Coons, MD  Date of Admission: 09/14/2023  1:43 PM Date of Discharge:  09/19/2023 Attending Physician: Dr. Ancil Balzarine  DISCHARGE DIAGNOSIS:  Primary Problem: Acute hypoxic respiratory failure [facility])   Hospital Problems: Principal Problem:   Acute hypoxic respiratory failure (HCC) Active Problems:   PULMONARY FIBROSIS ILD POST INFLAMMATORY CHRONIC   Lower extremity weakness   Loss of weight   Decreased appetite   FTT (failure to thrive) in adult   Bacteriuria with pyuria   Urinary frequency   Urinary incontinence   Frailty   Protein-calorie malnutrition (HCC)   Moderate cognitive impairment   ILD (interstitial lung disease) (HCC)   Protein-calorie malnutrition, severe    DISCHARGE MEDICATIONS:   Allergies as of 09/19/2023       Reactions   Fish Oil    unknown   Macrodantin [nitrofurantoin Macrocrystal] Other (See Comments)   Advised by MD not to take this med anymore        Medication List     STOP taking these medications    ALPRAZolam 0.25 MG tablet Commonly known as: XANAX   Xarelto  15 MG Tabs tablet Generic drug: Rivaroxaban        TAKE these medications    acetaminophen  500 MG tablet Commonly known as: TYLENOL  Take 500-1,000 mg by mouth every 6 (six) hours as needed for mild pain (depends on pain).   busPIRone 5 MG tablet Commonly known as: BUSPAR Take 1 tablet (5 mg total) by mouth 2 (two) times daily.   cyanocobalamin  1000 MCG tablet Take 1 tablet (1,000 mcg total) by mouth daily. Start taking on: Sep 20, 2023   fluticasone 50 MCG/ACT nasal spray Commonly known as: FLONASE Place 2 sprays into the nose daily as needed for allergies.   levothyroxine  137 MCG tablet Commonly known as: SYNTHROID  Take 1 tablet (137 mcg total) by mouth daily before breakfast. What changed:  medication strength how much to take additional instructions   oxymetazoline 0.05 % nasal  spray Commonly known as: AFRIN Place 2 sprays into the nose 2 (two) times daily as needed for congestion.   rosuvastatin  10 MG tablet Commonly known as: CRESTOR  Take 10 mg by mouth daily.               Durable Medical Equipment  (From admission, onward)           Start     Ordered   09/19/23 1121  For home use only DME oxygen  Once       Question Answer Comment  Length of Need Lifetime   Oxygen delivery system Gas      09/19/23 1120            DISPOSITION AND FOLLOW-UP:  [name] was discharged from [facility] in [place] condition. At the hospital follow up visit please address:  Acute hypoxic respiratory failure: likely secondary to progression of pulmonary fibrosis. Discharged with 2L oxygen via [place]. Reassess respiratory symptoms and need for oxygen outpatient.   Anxiety: Discharged with Buspar. Reassess anxiety and whether this medication is helping her.   General deconditioning: discharged to SNF for continued physical therapy and rehabilitation. Ensure she has mobility equipment at home and continues home health aide and PT.   Hypomagnesemia/Hypokalemia: Repleted during hospital stay. Recheck Magnesium  and Potassium at follow up visit.   Vitamin B12 deficiency: Found to have vitamin B12 deficiency, likely due to poor intake. Discharged with 1000  mcg daily B12.   Hypothyroidism: TSH low on admission, so synthroid  was decreased to 137 mcg daily. Recheck TSH in 6-8 weeks.   Follow-up Appointments:  Contact information for follow-up providers     Rosslyn Coons, MD. Schedule an appointment as soon as possible for a visit in 1 week(s).   Specialty: Endocrinology Contact information: [address] [place] [address] [phone]              Contact information for after-discharge care     Destination     [facility]erred SNF .   Service: Skilled Nursing Contact information: [address] [place]  [phone]                     HOSPITAL COURSE:   Acute Hypoxic Respiratory Failure  Interstitial lung disease Patient presented to the emergency department with acute hypoxic respiratory failure after she had a saturation of 89% at home during a physical therapy session. After negative workup for acute causes, we suspect that her acute hypoxic respiratory failure is due to the progression of her interstitial lung disease especially with her history of gradual worsening of her dyspnea with exertion. She ambulated with physical therapy and desatted on room air, but was able to maintain >90% on 2L [place].  She has remained stable for discharge with 2 liters of home O2 and outpatient follow up with pulmonology.    Goals of care Moderate Cognitive Impairment  Patient and son's goal is to remain at home, out of the hospital as much as possible. In the past they were denied personal living facility/ALF. We discussed PACE as an option for additional assistance at home. She does have PT at home and an aide that helps her 4 hours a day for 4 days. Her son lives close by and sees her frequently. Patient has long term care insurance as well per son. She has a DNR/DNI. Palliative care was consulted, who spoke with the family about goals of care and provided them with a MOST form.   Lower Extremity Weakness and Pain Patient presented with complaints of 3 weeks of progressive leg weakness and to the bilateral lower extremities. History is limited by her inability to verbally describe or localize her pain. Exam showed variability and inconsistency in weakness day to day, sometimes R > L, other days L > R. 1+ pitting edema in the left lower extremity.  DVT ultrasound study showed no evidence of DVT in the bilateral lower extremities.  DVT ultrasound does show cystic structures found in the right popliteal fossa, but these are non-tender, likely baker's  cysts. Although the left lower extremity had some erythema and edema, there were no rashes consistent with cellulitis. The weakness is variable, often asymmetric, and seems more related to general functional decline than an acute process. X-rays of the left hip and knee did not show any acute injuries. PT evaluated her and recommended placement at a skilled nursing facility to regain her strength, which was approved by her insurance.   Anxiety Per son, she has a history of anxiety but feels it worsens recently, especially before she gets up and moving. May be anticipatory related to recent dyspnea. Trail of hydroxyzine 10 mg during admission, which he says helped, but made her more somnolent today and is high risk given her overall condition, so we switched her to Buspar 5 mg BID for discharge. Discussed addition of SSRI for additional support, patient's  son will consider at a later time.  Vitamin B12 deficiency We assessed for vitamin deficiencies and found her B12 to be low. She received 1000 mcg IM B12, then was started on daily 1000 mcg.   Urinary tract infection, Klebsiella Patient presented with symptoms of UTI including increased urinary frequency.  Urinalysis findings of nitrite, leukocytes, and bacteria consistent with UTI. Culture grew klebsiella. She received ceftriaxone  for 3 days with improvement of symptoms.   Protein-calorie Malnutrition  Patient has poor oral intake that may be related to both tooth extraction and failure to thrive. Recommend continued meal supplementation outpatient.   Hypothyroidism TSH was 0.189 on admission. Synthroid  decreased to 137 mcg M-F.    Atrial Fibrilaltion  Stable.  By choice, she is not on anticoagulation.     DISCHARGE INSTRUCTIONS:   Discharge Instructions     Call MD for:  difficulty breathing, headache or visual disturbances   Complete by: As directed    Call MD for:  extreme fatigue   Complete by: As directed    Call MD for:   persistant dizziness or light-headedness   Complete by: As directed    Call MD for:  persistant nausea and vomiting   Complete by: As directed    Call MD for:  severe uncontrolled pain   Complete by: As directed    Call MD for:  temperature >100.4   Complete by: As directed    Discharge instructions   Complete by: As directed    You were hospitalized for low oxygen levels and weakness.  After medical workup, we think that the low oxygen levels are related to natural progression of your pulmonary fibrosis.  For your weakness, we have found you placement at a rehab facility to regain your strength. Thank you for allowing us  to be part of your care.   Please call your PCP, Rosslyn Coons, MD, and schedule a hospital follow up appointment within 1-2 weeks of discharge from the hospital.   Please note these changes made to your medications:   Please START taking:   Buspar, 5 mg twice daily for anxiety Vitamin B12, 1000 mcg daily for B12 deficiency  We decreased your levothyroxine  (Synthroid ) dose because your TSH was too low. Please take the new dose, 137 mcg, once daily in the morning, 30-60 minutes prior to breakfast.   Please STOP taking:   Xanax, as this can increase your risk of falls and is generally not recommended in older adults Xarelto , which you had already stopped taking prior to admission to the hospital  If you have any worsening shortness of breath despite the supplemental oxygen, increased confusion, fevers, chills, or persistent coughing, please return to the emergency department for further evaluation.  Otherwise, you can follow-up with your outpatient doctors for further management of your health conditions.   Increase activity slowly   Complete by: As directed    No wound care   Complete by: As directed        SUBJECTIVE:   Patient is evaluated at bedside. Reports non-localizable leg pain, but is unable to give a reliable history about her symptoms. Son at  bedside, reports that he feels like she slept well and was doing well yesterday. He is eager for her to leave the hospital and begin therapy.   Discharge Vitals:   BP (!) 143/61 (BP Location: Left Arm)   Pulse 85   Temp 98 F (36.7 C) (Oral)   Resp 18   Ht 5\' 1"  (1.549 m)  Wt 56.7 kg   SpO2 100%   BMI 23.62 kg/m   OBJECTIVE:  Physical Exam  Older woman, laying in bed on 2L [place], oriented to self and situation, unable to answer open ended questions Bilateral fine crackles, unchanged; normal respiratory rate and effort Heart rate regular, no murmur appreciated, appears euvolemic Diffuse tenderness to palpation in bilateral legs, worse on the left; moving right leg well; unable to flex left knee due to guarding, pain with palpation of left hip and knee  Pertinent Labs, Studies, and Procedures:     Latest Ref Rng & Units 09/18/2023    8:21 AM 09/16/2023    8:47 AM 09/15/2023    4:16 AM  CBC  WBC 4.0 - 10.5 K/uL 9.3  10.5  10.2   Hemoglobin 12.0 - 15.0 g/dL 62.1  30.8  65.7   Hematocrit 36.0 - 46.0 % 38.3  42.9  35.9   Platelets 150 - 400 K/uL 353  360  350        Latest Ref Rng & Units 09/16/2023    8:47 AM 09/15/2023    4:16 AM 09/14/2023    2:46 PM  CMP  Glucose 70 - 99 mg/dL 846  88  962   BUN 8 - 23 mg/dL 18  22  28    Creatinine 0.44 - 1.00 mg/dL 9.52  8.41  3.24   Sodium 135 - 145 mmol/L 133  133  133   Potassium 3.5 - 5.1 mmol/L 3.6  3.6  4.2   Chloride 98 - 111 mmol/L 98  98  95   CO2 22 - 32 mmol/L 23  25  23    Calcium  8.9 - 10.3 mg/dL 8.6  8.5  9.1   Total Protein 6.5 - 8.1 g/dL   7.5   Total Bilirubin 0.0 - 1.2 mg/dL   0.7   Alkaline Phos 38 - 126 U/L   40   AST 15 - 41 U/L   37   ALT 0 - 44 U/L   23     VAS US  LOWER EXTREMITY VENOUS (DVT) (7a-7p) Result Date: 09/16/2023  Lower Venous DVT Study Patient Name:  ALIZA MORET  Date of Exam:   09/14/2023 Medical Rec #: [id number]       Accession #:    [id number] Date of Birth: 27-Sep-1929        Patient Gender: F  Patient Age:   76 years Exam Location:  [facility] Procedure:      VAS US  LOWER EXTREMITY VENOUS (DVT) Referring Phys: [facility] --------------------------------------------------------------------------------  Indications: Pain, and Swelling.  Limitations: Poor ultrasound/tissue interface. Comparison Study: Previous exam (LLEV) on 07/03/2018 was negative for DVT. Performing Technologist: Arlyce Berger RVT, RDMS  Examination Guidelines: A complete evaluation includes B-mode imaging, spectral Doppler, color Doppler, and power Doppler as needed of all accessible portions of each vessel. Bilateral testing is considered an integral part of a complete examination. Limited examinations for reoccurring indications may be performed as noted. The reflux portion of the exam is performed with the patient in reverse Trendelenburg.  +---------+---------------+---------+-----------+----------+-------------------+ RIGHT    CompressibilityPhasicitySpontaneityPropertiesThrombus Aging      +---------+---------------+---------+-----------+----------+-------------------+ CFV      Full           Yes      Yes                                      +---------+---------------+---------+-----------+----------+-------------------+  SFJ      Full                                                             +---------+---------------+---------+-----------+----------+-------------------+ FV Prox  Full           Yes      Yes                                      +---------+---------------+---------+-----------+----------+-------------------+ FV Mid   Full           Yes      Yes                                      +---------+---------------+---------+-----------+----------+-------------------+ FV DistalFull           Yes      Yes                                      +---------+---------------+---------+-----------+----------+-------------------+ PFV      Full                                                              +---------+---------------+---------+-----------+----------+-------------------+ POP      Full           Yes      Yes                                      +---------+---------------+---------+-----------+----------+-------------------+ PTV                                                   Not well visualized +---------+---------------+---------+-----------+----------+-------------------+ PERO     Full                                                             +---------+---------------+---------+-----------+----------+-------------------+   +---------+---------------+---------+-----------+----------+-------------------+ LEFT     CompressibilityPhasicitySpontaneityPropertiesThrombus Aging      +---------+---------------+---------+-----------+----------+-------------------+ CFV      Full           Yes      Yes                                      +---------+---------------+---------+-----------+----------+-------------------+ SFJ      Full                                                             +---------+---------------+---------+-----------+----------+-------------------+  FV Prox  Full           Yes      Yes                                      +---------+---------------+---------+-----------+----------+-------------------+ FV Mid   Full           Yes      Yes                                      +---------+---------------+---------+-----------+----------+-------------------+ FV DistalFull           Yes      Yes                                      +---------+---------------+---------+-----------+----------+-------------------+ PFV      Full                                                             +---------+---------------+---------+-----------+----------+-------------------+ POP      Full           Yes      Yes                                       +---------+---------------+---------+-----------+----------+-------------------+ PTV                                                   Not well visualized +---------+---------------+---------+-----------+----------+-------------------+ PERO     Full                                                             +---------+---------------+---------+-----------+----------+-------------------+     Summary: BILATERAL: - No evidence of deep vein thrombosis seen in the lower extremities, bilaterally. - RIGHT: -cystic structures found in the popliteal fossa, largest measuring (4.11 x 0.99 x 2.85 cm). Subcutaneous edema noted in the calf.  LEFT: - No cystic structure found in the popliteal fossa.  *See table(s) above for measurements and observations. Electronically signed by Runell Countryman on 09/16/2023 at 9:37:03 AM.    Final    CT Head Wo Contrast Result Date: 09/14/2023 CLINICAL DATA:  Mental status change EXAM: CT HEAD WITHOUT CONTRAST TECHNIQUE: Contiguous axial images were obtained from the base of the skull through the vertex without intravenous contrast. RADIATION DOSE REDUCTION: This exam was performed according to the departmental dose-optimization program which includes automated exposure control, adjustment of the mA and/or kV according to patient size and/or use of iterative reconstruction technique. COMPARISON:  CT 06/30/2023 FINDINGS: Brain: No acute territorial infarction, hemorrhage, or intracranial mass. Atrophy and mild chronic small vessel ischemic changes of  the white matter. Non enlarged ventricles Vascular: No hyperdense vessels.  Carotid vascular calcification Skull: Normal. Negative for fracture or focal lesion. Sinuses/Orbits: No acute finding. Other: None IMPRESSION: 1. No CT evidence for acute intracranial abnormality. 2. Atrophy and mild chronic small vessel ischemic changes of the white matter. Electronically Signed   By: Esmeralda Hedge M.D.   On: 09/14/2023 16:38   DG Chest 2  View Result Date: 09/14/2023 CLINICAL DATA:  Shortness of breath EXAM: CHEST - 2 VIEW COMPARISON:  06/30/2023 FINDINGS: Low lung volumes are present, causing crowding of the pulmonary vasculature. Extensive peripheral interstitial accentuation persists and favors fibrosis. Confluent density in the vicinity of the minor fissure especially laterally. As before, gas beneath the right hemidiaphragm is believed to be within bowel. Atherosclerotic calcification of the aortic arch. Bony demineralization noted. Loop recorder noted. Airway thickening is present, suggesting bronchitis or reactive airways disease. Thoracolumbar spondylosis. Severe degenerative glenohumeral arthropathy on the left with evidence of chondrocalcinosis in the glenohumeral joints. IMPRESSION: 1. Low lung volumes, causing crowding of the pulmonary vasculature. 2. Extensive peripheral interstitial accentuation favors fibrosis. 3. Airway thickening is present, suggesting bronchitis or reactive airways disease. 4. Severe degenerative glenohumeral arthropathy on the left with evidence of chondrocalcinosis in the glenohumeral joints. 5. Bony demineralization. 6. Aortic Atherosclerosis (ICD10-I70.0). Electronically Signed   By: Freida Jes M.D.   On: 09/14/2023 15:15     Signed: Dorthy Gavia, MD Internal Medicine Resident, PGY-1 [facility]  1:22 PM, 09/19/2023

## 2023-09-19 NOTE — Progress Notes (Signed)
 AVS placed in discharge packet and placed with chart.

## 2023-09-19 NOTE — Progress Notes (Signed)
 Occupational Therapy Treatment Patient Details Name: Kayla Wells MRN: [id number] DOB: 09-04-1929 Today's Date: 09/19/2023   History of present illness Pt is a 89 y.o. female presented to [facility] 5/16 for SOB. C/o of bil LE weakness over past week.   CT head CT negative, chest CT showed low lung volume, airway thickening, concerns for fibrosis. PMH: OA, hypothyroidism, pulmonary fibrosis, TIA, a-fib   OT comments  Pt progressing towards goals. Pt A & O x2, oriented to time and situation. Pt declining EOB activities, but agreeable to bed level ADLs. Dressing completed with mod to total assist. Pt continues to require max encouragement from therapy staff and family to engage in sessions. Continue to recommend <3 hours of skilled rehab daily to optimize independence levels. Will continue to follow acutely.       If plan is discharge home, recommend the following:  Two people to help with walking and/or transfers;A lot of help with bathing/dressing/bathroom   Equipment Recommendations  Other (comment) (Defer to next venue)    Recommendations for Other Services      Precautions / Restrictions Precautions Precautions: Fall Recall of Precautions/Restrictions: Impaired Restrictions Weight Bearing Restrictions Per Provider Order: No       Mobility Bed Mobility Overal bed mobility: Needs Assistance Bed Mobility: Rolling Rolling: Min assist, Used rails         General bed mobility comments: Min assist to roll and maintain sidelying    Transfers Overall transfer level: Needs assistance       General transfer comment: Pt declines     Balance Overall balance assessment: Needs assistance     ADL either performed or assessed with clinical judgement   ADL Overall ADL's : Needs assistance/impaired Eating/Feeding: Set up;Bed level       Upper Body Dressing : Moderate assistance;Bed level Upper Body Dressing Details (indicate cue type and reason): Pt requiring cues to thread  arms, unable to sequence alone Lower Body Dressing: Total assistance;Bed level Lower Body Dressing Details (indicate cue type and reason): Pt assisted by lifting LEs and rolling <25% of effort       General ADL Comments: Pt still declining OOB activities despite max encouragement from OT and family    Extremity/Trunk Assessment Upper Extremity Assessment Upper Extremity Assessment: Generalized weakness   Lower Extremity Assessment Lower Extremity Assessment: Defer to PT evaluation        Vision   Vision Assessment?: No apparent visual deficits         Communication Communication Communication: Impaired Factors Affecting Communication: Hearing impaired   Cognition Arousal: Alert Behavior During Therapy: Flat affect Cognition: Difficult to assess Difficult to assess due to:  (Limited communication)           OT - Cognition Comments: Pt A & O x2,  spoke limited words, required max encouragement for responses other than head nods or shakes       Following commands: Impaired Following commands impaired: Follows one step commands with increased time, Follows one step commands inconsistently      Cueing   Cueing Techniques: Verbal cues, Gestural cues        General Comments Pt son present for session and supportive    Pertinent Vitals/ Pain       Pain Assessment Pain Assessment: Faces Faces Pain Scale: Hurts even more Pain Location: BLEs, L>R Pain Descriptors / Indicators: Guarding Pain Intervention(s): Limited activity within patient's tolerance   Frequency  Min 1X/week        Progress Toward  Goals  OT Goals(current goals can now be found in the care plan section)  Progress towards OT goals: Progressing toward goals  Acute Rehab OT Goals Patient Stated Goal: none stated OT Goal Formulation: With patient Time For Goal Achievement: 09/29/23 Potential to Achieve Goals: Fair ADL Goals Pt Will Perform Grooming: with set-up;sitting Pt Will Perform  Upper Body Dressing: with set-up;sitting Pt Will Perform Lower Body Dressing: with max assist;sit to/from stand  Plan         AM-PAC OT "6 Clicks" Daily Activity     Outcome Measure   Help from another person eating meals?: None Help from another person taking care of personal grooming?: A Little Help from another person toileting, which includes using toliet, bedpan, or urinal?: Total Help from another person bathing (including washing, rinsing, drying)?: A Lot Help from another person to put on and taking off regular upper body clothing?: A Lot Help from another person to put on and taking off regular lower body clothing?: A Lot 6 Click Score: 14    End of Session    OT Visit Diagnosis: Muscle weakness (generalized) (M62.81)   Activity Tolerance Patient limited by pain   Patient Left in bed;with call bell/phone within reach;with bed alarm set;with family/visitor present   Nurse Communication Mobility status        Time: 1610-9604 OT Time Calculation (min): 17 min  Charges: OT General Charges $OT Visit: 1 Visit OT Treatments $Self Care/Home Management : 8-22 mins  Delmer Ferraris, OT  Acute Rehabilitation Services Office [phone] Secure chat preferred   Mickael Alamo 09/19/2023, 2:03 PM
